# Patient Record
Sex: Female | Born: 1975 | Race: Black or African American | Hispanic: No | Marital: Married | Smoking: Never smoker
Health system: Southern US, Community
[De-identification: ages and names within clinical notes are randomized; demographics above are authoritative.]

## PROBLEM LIST (undated history)

## (undated) DIAGNOSIS — M6200 Separation of muscle (nontraumatic), unspecified site: Secondary | ICD-10-CM

## (undated) DIAGNOSIS — F419 Anxiety disorder, unspecified: Secondary | ICD-10-CM

## (undated) DIAGNOSIS — J3489 Other specified disorders of nose and nasal sinuses: Secondary | ICD-10-CM

## (undated) DIAGNOSIS — R05 Cough: Secondary | ICD-10-CM

## (undated) DIAGNOSIS — I10 Essential (primary) hypertension: Secondary | ICD-10-CM

## (undated) DIAGNOSIS — E049 Nontoxic goiter, unspecified: Secondary | ICD-10-CM

## (undated) HISTORY — PX: COLONOSCOPY: SHX174

## (undated) HISTORY — DX: Anxiety disorder, unspecified: F41.9

---

## 2000-05-11 ENCOUNTER — Other Ambulatory Visit: Admission: RE | Admit: 2000-05-11 | Discharge: 2000-05-11 | Payer: Self-pay | Admitting: Obstetrics & Gynecology

## 2002-04-14 ENCOUNTER — Other Ambulatory Visit: Admission: RE | Admit: 2002-04-14 | Discharge: 2002-04-14 | Payer: Self-pay | Admitting: Obstetrics & Gynecology

## 2002-08-29 ENCOUNTER — Other Ambulatory Visit: Admission: RE | Admit: 2002-08-29 | Discharge: 2002-08-29 | Payer: Self-pay | Admitting: Obstetrics & Gynecology

## 2003-11-05 ENCOUNTER — Other Ambulatory Visit: Admission: RE | Admit: 2003-11-05 | Discharge: 2003-11-05 | Payer: Self-pay | Admitting: Obstetrics & Gynecology

## 2004-08-31 ENCOUNTER — Emergency Department (HOSPITAL_COMMUNITY): Admission: EM | Admit: 2004-08-31 | Discharge: 2004-08-31 | Payer: Self-pay | Admitting: Emergency Medicine

## 2005-01-06 ENCOUNTER — Inpatient Hospital Stay (HOSPITAL_COMMUNITY): Admission: RE | Admit: 2005-01-06 | Discharge: 2005-01-09 | Payer: Self-pay | Admitting: Obstetrics and Gynecology

## 2005-01-06 ENCOUNTER — Encounter (INDEPENDENT_AMBULATORY_CARE_PROVIDER_SITE_OTHER): Payer: Self-pay | Admitting: *Deleted

## 2005-01-06 HISTORY — PX: RIGHT OOPHORECTOMY: SHX2359

## 2005-02-05 ENCOUNTER — Other Ambulatory Visit: Admission: RE | Admit: 2005-02-05 | Discharge: 2005-02-05 | Payer: Self-pay | Admitting: Obstetrics and Gynecology

## 2006-03-31 ENCOUNTER — Ambulatory Visit: Payer: Self-pay | Admitting: Gastroenterology

## 2006-05-05 ENCOUNTER — Ambulatory Visit: Payer: Self-pay | Admitting: Gastroenterology

## 2006-07-02 ENCOUNTER — Inpatient Hospital Stay (HOSPITAL_COMMUNITY): Admission: AD | Admit: 2006-07-02 | Discharge: 2006-07-06 | Payer: Self-pay | Admitting: Obstetrics & Gynecology

## 2006-07-02 ENCOUNTER — Encounter (INDEPENDENT_AMBULATORY_CARE_PROVIDER_SITE_OTHER): Payer: Self-pay | Admitting: Specialist

## 2006-07-02 HISTORY — PX: TUBAL LIGATION: SHX77

## 2007-07-03 ENCOUNTER — Emergency Department (HOSPITAL_COMMUNITY): Admission: EM | Admit: 2007-07-03 | Discharge: 2007-07-03 | Payer: Self-pay | Admitting: Emergency Medicine

## 2008-05-22 LAB — CONVERTED CEMR LAB: Pap Smear: NORMAL

## 2008-08-03 ENCOUNTER — Ambulatory Visit: Payer: Self-pay | Admitting: Internal Medicine

## 2008-08-03 DIAGNOSIS — I10 Essential (primary) hypertension: Secondary | ICD-10-CM

## 2008-08-03 DIAGNOSIS — D509 Iron deficiency anemia, unspecified: Secondary | ICD-10-CM

## 2008-08-03 LAB — CONVERTED CEMR LAB
ALT: 16 units/L (ref 0–35)
Albumin: 4.3 g/dL (ref 3.5–5.2)
BUN: 13 mg/dL (ref 6–23)
Basophils Relative: 0.8 % (ref 0.0–3.0)
Bilirubin Urine: NEGATIVE
CO2: 30 meq/L (ref 19–32)
Calcium: 9.6 mg/dL (ref 8.4–10.5)
Eosinophils Relative: 6.6 % — ABNORMAL HIGH (ref 0.0–5.0)
GFR calc Af Amer: 125 mL/min
Glucose, Bld: 105 mg/dL — ABNORMAL HIGH (ref 70–99)
HCT: 40.6 % (ref 36.0–46.0)
HDL: 62.7 mg/dL (ref >39.0)
Hemoglobin: 13.5 g/dL (ref 12.0–15.0)
Lymphocytes Relative: 38.8 % (ref 12.0–46.0)
Monocytes Absolute: 0.5 10*3/uL (ref 0.1–1.0)
Monocytes Relative: 9.4 % (ref 3.0–12.0)
Neutro Abs: 2.1 10*3/uL (ref 1.4–7.7)
Nitrite: NEGATIVE
RBC: 4.24 M/uL (ref 3.87–5.11)
Saturation Ratios: 25.4 % (ref 20.0–50.0)
Specific Gravity, Urine: 1.02 (ref 1.000–1.03)
Total Protein, Urine: NEGATIVE mg/dL
Total Protein: 8.7 g/dL — ABNORMAL HIGH (ref 6.0–8.3)
Transferrin: 368.6 mg/dL — ABNORMAL HIGH (ref >=212.0)
WBC: 4.8 10*3/uL (ref 4.5–10.5)
pH: 6 (ref 5.0–8.0)

## 2008-11-08 ENCOUNTER — Ambulatory Visit: Payer: Self-pay | Admitting: Internal Medicine

## 2008-11-08 DIAGNOSIS — M79609 Pain in unspecified limb: Secondary | ICD-10-CM | POA: Insufficient documentation

## 2008-11-09 ENCOUNTER — Telehealth: Payer: Self-pay | Admitting: Internal Medicine

## 2009-03-01 ENCOUNTER — Ambulatory Visit: Payer: Self-pay | Admitting: Internal Medicine

## 2009-03-01 DIAGNOSIS — R5381 Other malaise: Secondary | ICD-10-CM | POA: Insufficient documentation

## 2009-03-01 DIAGNOSIS — R5383 Other fatigue: Secondary | ICD-10-CM

## 2009-03-01 DIAGNOSIS — E049 Nontoxic goiter, unspecified: Secondary | ICD-10-CM | POA: Insufficient documentation

## 2009-03-01 DIAGNOSIS — N920 Excessive and frequent menstruation with regular cycle: Secondary | ICD-10-CM

## 2009-03-04 ENCOUNTER — Encounter: Payer: Self-pay | Admitting: Internal Medicine

## 2009-03-05 LAB — CONVERTED CEMR LAB
BUN: 10 mg/dL (ref 6–23)
Basophils Absolute: 0.1 10*3/uL (ref 0.0–0.1)
Bilirubin Urine: NEGATIVE
Bilirubin, Direct: 0.1 mg/dL (ref 0.0–0.3)
Chloride: 108 meq/L (ref 96–112)
Creatinine, Ser: 0.7 mg/dL (ref 0.4–1.2)
Eosinophils Absolute: 0.2 10*3/uL (ref 0.0–0.7)
Eosinophils Relative: 3.9 % (ref 0.0–5.0)
Folate: 6.9 ng/mL
GFR calc non Af Amer: 123.62 mL/min (ref >60)
Glucose, Bld: 97 mg/dL (ref 70–99)
HCT: 35.7 % — ABNORMAL LOW (ref 36.0–46.0)
Iron: 63 ug/dL (ref 42–145)
Ketones, ur: NEGATIVE mg/dL
Lymphs Abs: 2.1 10*3/uL (ref 0.7–4.0)
MCV: 99.2 fL (ref 78.0–100.0)
Monocytes Absolute: 0.5 10*3/uL (ref 0.1–1.0)
Neutrophils Relative %: 49.7 % (ref 43.0–77.0)
Platelets: 308 10*3/uL (ref 150.0–400.0)
Potassium: 4.4 meq/L (ref 3.5–5.1)
RDW: 13.3 % (ref 11.5–14.6)
Sed Rate: 34 mm/hr — ABNORMAL HIGH (ref 0–22)
TSH: 0.6 microintl units/mL (ref 0.35–5.50)
Total Bilirubin: 0.5 mg/dL (ref 0.3–1.2)
Total Protein, Urine: NEGATIVE mg/dL
Transferrin: 356.9 mg/dL (ref 212.0–360.0)
Urine Glucose: NEGATIVE mg/dL
Urobilinogen, UA: 0.2 (ref 0.0–1.0)
Vit D, 25-Hydroxy: 15 ng/mL — ABNORMAL LOW (ref 30–89)
Vitamin B-12: 368 pg/mL (ref 211–911)
WBC: 5.9 10*3/uL (ref 4.5–10.5)

## 2009-05-13 ENCOUNTER — Encounter: Admission: RE | Admit: 2009-05-13 | Discharge: 2009-05-13 | Payer: Self-pay | Admitting: Internal Medicine

## 2009-05-13 ENCOUNTER — Encounter: Payer: Self-pay | Admitting: Internal Medicine

## 2009-05-13 DIAGNOSIS — E042 Nontoxic multinodular goiter: Secondary | ICD-10-CM | POA: Insufficient documentation

## 2009-05-21 ENCOUNTER — Encounter: Admission: RE | Admit: 2009-05-21 | Discharge: 2009-05-21 | Payer: Self-pay | Admitting: Internal Medicine

## 2009-05-21 ENCOUNTER — Encounter: Payer: Self-pay | Admitting: Internal Medicine

## 2009-05-21 ENCOUNTER — Other Ambulatory Visit: Admission: RE | Admit: 2009-05-21 | Discharge: 2009-05-21 | Payer: Self-pay | Admitting: Diagnostic Radiology

## 2009-10-17 ENCOUNTER — Ambulatory Visit: Payer: Self-pay | Admitting: Internal Medicine

## 2009-10-21 ENCOUNTER — Telehealth: Payer: Self-pay | Admitting: Internal Medicine

## 2009-10-24 ENCOUNTER — Telehealth: Payer: Self-pay | Admitting: Internal Medicine

## 2009-10-24 ENCOUNTER — Encounter (INDEPENDENT_AMBULATORY_CARE_PROVIDER_SITE_OTHER): Payer: Self-pay | Admitting: *Deleted

## 2009-10-24 ENCOUNTER — Ambulatory Visit: Payer: Self-pay | Admitting: Internal Medicine

## 2009-10-28 ENCOUNTER — Telehealth: Payer: Self-pay | Admitting: Internal Medicine

## 2009-10-29 ENCOUNTER — Telehealth: Payer: Self-pay | Admitting: Internal Medicine

## 2009-10-29 ENCOUNTER — Ambulatory Visit: Payer: Self-pay | Admitting: Cardiovascular Disease

## 2009-10-29 DIAGNOSIS — J039 Acute tonsillitis, unspecified: Secondary | ICD-10-CM

## 2009-10-30 ENCOUNTER — Telehealth: Payer: Self-pay | Admitting: Internal Medicine

## 2009-10-30 ENCOUNTER — Ambulatory Visit: Payer: Self-pay | Admitting: Internal Medicine

## 2009-10-30 LAB — CONVERTED CEMR LAB
Eosinophils Absolute: 0.1 10*3/uL (ref 0.0–0.7)
Eosinophils Relative: 1.4 % (ref 0.0–5.0)
Lymphocytes Relative: 29.5 % (ref 12.0–46.0)
MCV: 94.7 fL (ref 78.0–100.0)
Mono Screen: POSITIVE
Monocytes Absolute: 0.6 10*3/uL (ref 0.1–1.0)
Neutrophils Relative %: 60.6 % (ref 43.0–77.0)
Platelets: 349 10*3/uL (ref 150.0–400.0)
Sed Rate: 42 mm/hr — ABNORMAL HIGH (ref 0–22)
WBC: 7.3 10*3/uL (ref 4.5–10.5)

## 2010-03-31 ENCOUNTER — Ambulatory Visit: Payer: Self-pay | Admitting: Internal Medicine

## 2010-03-31 DIAGNOSIS — K219 Gastro-esophageal reflux disease without esophagitis: Secondary | ICD-10-CM | POA: Insufficient documentation

## 2010-04-01 ENCOUNTER — Telehealth: Payer: Self-pay | Admitting: Internal Medicine

## 2010-05-01 ENCOUNTER — Telehealth: Payer: Self-pay | Admitting: Internal Medicine

## 2010-05-01 DIAGNOSIS — E041 Nontoxic single thyroid nodule: Secondary | ICD-10-CM

## 2010-05-12 ENCOUNTER — Encounter: Admission: RE | Admit: 2010-05-12 | Discharge: 2010-05-12 | Payer: Self-pay | Admitting: Internal Medicine

## 2010-07-07 ENCOUNTER — Telehealth: Payer: Self-pay | Admitting: Gastroenterology

## 2010-07-08 ENCOUNTER — Encounter: Payer: Self-pay | Admitting: Nurse Practitioner

## 2010-07-08 ENCOUNTER — Encounter: Payer: Self-pay | Admitting: Gastroenterology

## 2010-07-08 ENCOUNTER — Ambulatory Visit
Admission: RE | Admit: 2010-07-08 | Discharge: 2010-07-08 | Payer: Self-pay | Source: Home / Self Care | Attending: Internal Medicine | Admitting: Internal Medicine

## 2010-07-08 DIAGNOSIS — K5900 Constipation, unspecified: Secondary | ICD-10-CM | POA: Insufficient documentation

## 2010-07-08 DIAGNOSIS — K625 Hemorrhage of anus and rectum: Secondary | ICD-10-CM | POA: Insufficient documentation

## 2010-07-13 ENCOUNTER — Encounter: Payer: Self-pay | Admitting: Internal Medicine

## 2010-07-15 ENCOUNTER — Ambulatory Visit
Admission: RE | Admit: 2010-07-15 | Discharge: 2010-07-15 | Payer: Self-pay | Source: Home / Self Care | Attending: Internal Medicine | Admitting: Internal Medicine

## 2010-07-15 ENCOUNTER — Encounter: Payer: Self-pay | Admitting: Internal Medicine

## 2010-07-16 DIAGNOSIS — H6690 Otitis media, unspecified, unspecified ear: Secondary | ICD-10-CM | POA: Insufficient documentation

## 2010-07-18 ENCOUNTER — Other Ambulatory Visit: Payer: Self-pay | Admitting: Obstetrics & Gynecology

## 2010-07-24 NOTE — Progress Notes (Signed)
Summary: sore throat  Phone Note Call from Patient   Caller: Patient Summary of Call: Patient called c/o increased sore throat today, no fever. She states that she has four tabs of moxatag left and wanted to [facility] if something different is needed or what MD advise. Thanks Initial call taken by: Rock Nephew CMA,  Oct 21, 2009 11:31 AM  Follow-up for Phone Call        keep taking moxatag, f/up soon if she's having trouble swallowing Follow-up by: Etta Grandchild MD,  Oct 21, 2009 11:45 AM  Additional Follow-up for Phone Call Additional follow up Details #1::        informed pt Additional Follow-up by: Ami Bullins CMA,  Oct 21, 2009 2:58 PM

## 2010-07-24 NOTE — Progress Notes (Signed)
     Follow-up for Phone Call       Follow-up by: Etta Grandchild MD,  Oct 30, 2009 11:05 AM    Additional Follow-up for Phone Call Additional follow up Details #2::    she has mono, it will take time for her to feel better Follow-up by: Etta Grandchild MD,  Oct 30, 2009 11:05 AM  Additional Follow-up for Phone Call Additional follow up Details #3:: Details for Additional Follow-up Action Taken: Patient notified and request rx to help numb throat. [facility]acy is [facility].Marland KitchenMarland KitchenAlvy Beal Archie CMA  Oct 30, 2009 11:31 AM   Patient notified rx sent in.Marland KitchenMarland KitchenAlvy Beal Archie CMA  Oct 30, 2009 1:02 PM   New/Updated Medications: * PRELONE 15 MG/5ML SYRP (PREDNISOLONE)/VISCOUS LIDOCAINE swish and swallow 5 ml by mouth QID as needed for sore throat Prescriptions: PRELONE 15 MG/5ML SYRP (PREDNISOLONE)/VISCOUS LIDOCAINE swish and swallow 5 ml by mouth QID as needed for sore throat  #120 ml x 0   Entered and Authorized by:   Etta Grandchild MD   Signed by:   Etta Grandchild MD on 10/30/2009   Method used:   Printed then faxed [facility] ...       [facility]  [address] [phone][facility] ([facility]ail)       [address]       [place], [place]  [id number]       Ph: [phone] or [phone]       Fax: [phone]   RxID:   [id number]

## 2010-07-24 NOTE — Progress Notes (Signed)
Summary: Appt sooner tahn 1st avail  Phone Note Call from Patient Call back at [phone] in Lab   Call For: Dr Christella Hartigan Summary of Call: Having a problem with constipation. First avail appt 2-28 can she be seen sooner? Initial call taken by: Leanor Kail [facility],  July 07, 2010 12:48 PM  Follow-up for Phone Call        given appt. with N.P. for tomorrow. Follow-up by: Teryl Lucy RN,  July 07, 2010 3:22 PM

## 2010-07-24 NOTE — Letter (Signed)
Summary: Out of Work  [facility]  [address] [place], [place] [address]   Phone: [phone]  Fax: [phone]    October 17, 2009   Employee:  MARZETTA LANZA [employer]    To Whom It May Concern:   For Medical reasons, please excuse the above named employee from work for the following dates:  Start:   10/17/09  End:   10/21/09  If you need additional information, please feel free to contact our office.         Sincerely,    Etta Grandchild MD

## 2010-07-24 NOTE — Assessment & Plan Note (Signed)
Summary: acute/LMB   Vital Signs:  Patient profile:   35 year old female Height:      65.5 inches (166.37 cm) Weight:      153.0 pounds (69.55 kg) O2 Sat:      99 % on Room air Temp:     98.5 degrees F (36.94 degrees C) oral Pulse rate:   70 / minute BP sitting:   130 / 92  (left arm) Cuff size:   regular  Vitals Entered By: Orlan Leavens RMA (March 31, 2010 10:36 AM)  O2 Flow:  Room air CC:  Heartburn x's 1 week Is Patient Diabetic? No Pain Assessment Patient in pain? no      Comments Pt states ? anxiety, very anxious. Heart fluttering at times. Pt states her dog was killed week ago, and that when she started having sxs.    Primary Care Lusia Greis:  Corwin Levins MD  CC:   Heartburn x's 1 week.  History of Present Illness:  Heartburn      This is a 35 year old woman who presents with Heartburn.  The symptoms began 1 week ago.  The intensity is described as moderate.  precipitated by anxiety related to unexpected death of dog.  The patient reports acid reflux and sour taste in mouth, but denies epigastric pain, chest pain, trouble swallowing, weight loss, and weight gain.  The patient denies the following alarm features: melena, dysphagia, hematemesis, vomiting, involuntary weight loss >5%, and history of anemia.  Symptoms are worse with lying down.  Prior evaluation has included no diagnostic studies.  Treatment that was tried and either found to be ineffective or stopped due to problems include an OTC antacid and an OTC PPI.    Clinical Review Panels:  Immunizations   Last Tetanus Booster:  given (05/22/2008)   Last Flu Vaccine:  given (05/22/2008)   Last H1N1 Vaccine 1:  H1N1 vaccine G code (05/22/2008)  CBC   WBC:  7.3 (10/30/2009)   RBC:  3.60 (10/30/2009)   Hgb:  11.6 (10/30/2009)   Hct:  34.1 (10/30/2009)   Platelets:  349.0 (10/30/2009)   MCV  94.7 (10/30/2009)   MCHC  34.0 (10/30/2009)   RDW  13.7 (10/30/2009)   PMN:  60.6 (10/30/2009)   Lymphs:  29.5  (10/30/2009)   Monos:  7.9 (10/30/2009)   Eosinophils:  1.4 (10/30/2009)   Basophil:  0.6 (10/30/2009)  Complete Metabolic Panel   Glucose:  97 (03/01/2009)   Sodium:  141 (03/01/2009)   Potassium:  4.4 (03/01/2009)   Chloride:  108 (03/01/2009)   CO2:  28 (03/01/2009)   BUN:  10 (03/01/2009)   Creatinine:  0.7 (03/01/2009)   Albumin:  3.8 (03/01/2009)   Total Protein:  7.6 (03/01/2009)   Calcium:  9.0 (03/01/2009)   Total Bili:  0.5 (03/01/2009)   Alk Phos:  45 (03/01/2009)   SGPT (ALT):  15 (03/01/2009)   SGOT (AST):  20 (03/01/2009)   Current Medications (verified): 1)  Hydrochlorothiazide 25 Mg  Tabs (Hydrochlorothiazide) .... Take 1 Tab By Mouth Every Morning  Allergies (verified): No Known Drug Allergies  Past History:  Past Medical History: Hypertension  Review of Systems  The patient denies fever, dyspnea on exertion, peripheral edema, and abdominal pain.    Physical Exam  General:  alert, well-developed, well-nourished, and cooperative to examination.    Lungs:  normal respiratory effort, no intercostal retractions or use of accessory muscles; normal breath sounds bilaterally - no crackles and no wheezes.  Heart:  normal rate, regular rhythm, no murmur, and no rub. BLE without edema. Abdomen:  soft, non-tender, normal bowel sounds, no distention; no masses and no appreciable hepatomegaly or splenomegaly.   Psych:  Oriented X3, memory intact for recent and remote, normally interactive, good eye contact, not anxious appearing, mildly depressed appearing and tired but not agitated.      Impression & Recommendations:  Problem # 1:  GERD (ICD-530.81)  mild symptoms, exam benign worse with anxiety and stress over past 1 week ineffective OTC tx with pepcid otc and mylanta - samples and rx for dexilant today - erx done to f/u if symptoms unimproved Her updated medication list for this problem includes:    Dexilant 60 Mg Cpdr (Dexlansoprazole) .Marland Kitchen... 1 by  mouth once daily  Labs Reviewed: Hgb: 11.6 (10/30/2009)   Hct: 34.1 (10/30/2009)  Orders: Prescription Created Electronically [phone])  Problem # 2:  MOURNING (ICD-309.0) precipitated by unexpected loss of pet dog - generic ambein given to use as needed for short term reliefof assoc insomnia -   Complete Medication List: 1)  Hydrochlorothiazide 25 Mg Tabs (Hydrochlorothiazide) .... Take 1 tab by mouth every morning 2)  Zolpidem Tartrate 5 Mg Tabs (Zolpidem tartrate) .Marland Kitchen.. 1 by mouth at bedtime as needed for sleep 3)  Dexilant 60 Mg Cpdr (Dexlansoprazole) .Marland Kitchen.. 1 by mouth once daily  Patient Instructions: 1)  it was good to see you today. 2)  use zolipedem for sleep as needed and dexilant once daily for heartburn/reflux symptoms - your prescriptions have been faxed and electronically submitted to your pharmacy. Please take as directed. Contact our office if you believe you're having problems with the medication(s).  3)  Please schedule a follow-up appointment as needed. Prescriptions: DEXILANT 60 MG CPDR (DEXLANSOPRAZOLE) 1 by mouth once daily  #30 x 0   Entered and Authorized by:   Newt Lukes MD   Signed by:   Newt Lukes MD on 03/31/2010   Method used:   [facility]        [facility]  [address] [phone][facility] (retail)       [address]       [place], [place]  [address]       Ph: [phone] or [phone]       Fax: [phone]   RxID:   [id number] ZOLPIDEM TARTRATE 5 MG TABS (ZOLPIDEM TARTRATE) 1 by mouth at bedtime as needed for sleep  #20 x 0   Entered and Authorized by:   Newt Lukes MD   Signed by:   Newt Lukes MD on 03/31/2010   Method used:   Printed then faxed to ...       [facility]  [address] [phone]* (retail)       [address]       [place], [place]  [address]       Ph: [phone] or [phone]       Fax: [phone]   RxID:   [id number]

## 2010-07-24 NOTE — Letter (Signed)
Summary: [facility] Instructions  [facility]  [address] [place], [place] [address]   Phone: [phone]  Fax: [phone]       Vickie Galvan    [birth year]    MRN: [id number]        Procedure Day /Date:08-05-2010     Arrival Time:1:30 PM      Procedure Time :2:30 PM     Location of Procedure:                    X     [facility] (4th Floor)  PREPARATION FOR COLONOSCOPY WITH MOVIPREP   Starting 5 days prior to your procedure 07-31-2010  do not eat nuts, seeds, popcorn, corn, beans, peas,  salads, or any raw vegetables.  Do not take any fiber supplements (e.g. Metamucil, Citrucel, and Benefiber).  THE DAY BEFORE YOUR PROCEDURE         DATE: 08-04-2010  DAY: Monday  1.  Drink clear liquids the entire day-NO SOLID FOOD  2.  Do not drink anything colored red or purple.  Avoid juices with pulp.  No orange juice.  3.  Drink at least 64 oz. (8 glasses) of fluid/clear liquids during the day to prevent dehydration and help the prep work efficiently.  CLEAR LIQUIDS INCLUDE: Water Jello Ice Popsicles Tea (sugar ok, no milk/cream) Powdered fruit flavored drinks Coffee (sugar ok, no milk/cream) Gatorade Juice: apple, white grape, white cranberry  Lemonade Clear bullion, consomm, broth Carbonated beverages (any kind) Strained chicken noodle soup Hard Candy                             4.  In the morning, mix first dose of MoviPrep solution:    Empty 1 Pouch A and 1 Pouch B into the disposable container    Add lukewarm drinking water to the top line of the container. Mix to dissolve    Refrigerate (mixed solution should be used within 24 hrs)  5.  Begin drinking the prep at 5:00 p.m. The MoviPrep container is divided by 4 marks.   Every 15 minutes drink the solution down to the next mark (approximately 8 oz) until the full liter is complete.   6.  Follow completed prep with 16 oz of clear liquid of your choice (Nothing red or purple).  Continue to  drink clear liquids until bedtime.  7.  Before going to bed, mix second dose of MoviPrep solution:    Empty 1 Pouch A and 1 Pouch B into the disposable container    Add lukewarm drinking water to the top line of the container. Mix to dissolve    Refrigerate  THE DAY OF YOUR PROCEDURE      DATE:08-04-2010 DAY: Monday  Beginning at 9:30 AM  (5 hours before procedure):         1. Every 15 minutes, drink the solution down to the next mark (approx 8 oz) until the full liter is complete.  2. Follow completed prep with 16 oz. of clear liquid of your choice.    3. You may drink clear liquids until 12: 30 PM  (2 HOURS BEFORE PROCEDURE).   MEDICATION INSTRUCTIONS  Unless otherwise instructed, you should take regular prescription medications with a small sip of water   as early as possible the morning of your procedure.       OTHER INSTRUCTIONS  You will need a responsible adult at least  35 years of age to accompany you and drive you home.   This person must remain in the waiting room during your procedure.  Wear loose fitting clothing that is easily removed.  Leave jewelry and other valuables at home.  However, you may wish to bring a book to read or  an iPod/MP3 player to listen to music as you wait for your procedure to start.  Remove all body piercing jewelry and leave at home.  Total time from sign-in until discharge is approximately 2-3 hours.  You should go home directly after your procedure and rest.  You can resume normal activities the  day after your procedure.  The day of your procedure you should not:   Drive   Make legal decisions   Operate machinery   Drink alcohol   Return to work  You will receive specific instructions about eating, activities and medications before you leave.    The above instructions have been reviewed and explained to me by   _______________________    I fully understand and can verbalize these instructions  _____________________________ Date _________

## 2010-07-24 NOTE — Assessment & Plan Note (Addendum)
Summary: constipation   History of Present Illness Visit Type: Follow-up Visit Primary GI MD: Rob Bunting MD Primary Provider: Bayard Males Requesting Provider: n/a Chief Complaint: constipation, with the feeling of having to push while having a bowel movement. Pt tried Mag Citrate last weekend but does not think it cleaned her out History of Present Illness:   Patient was seen by Dr. Christella Hartigan in 2007 for rectal bleeding. She was pregnant at the time and plan wasfor her to return after delivery for a flexible sigmoidoscopy. She ddin't return because rectal bleeding resolved.   Patient presents today with a change in bowel habits. Normally has 2-3 solid BMs a day. In the last two weeks she has needed to strain. Complains of gassiness / bloating post intercourse. This am she had a small amount of rectal bleeding (tissue). No rectal pain. No abdominal pain. No nausea or vomiting.  Weight stable. No fevers.    GI Review of Systems      Denies abdominal pain, acid reflux, belching, bloating, chest pain, dysphagia with liquids, dysphagia with solids, heartburn, loss of appetite, nausea, vomiting, vomiting blood, weight loss, and  weight gain.      Reports constipation and  rectal bleeding.     Denies anal fissure, black tarry stools, change in bowel habit, diarrhea, diverticulosis, fecal incontinence, heme positive stool, hemorrhoids, irritable bowel syndrome, jaundice, light color stool, liver problems, and  rectal pain.    Current Medications (verified): 1)  Hydrochlorothiazide 25 Mg  Tabs (Hydrochlorothiazide) .... 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 Never Smoked Alcohol use-yes - occasional  Review of Systems  The patient denies allergy/sinus, anemia, anxiety-new, arthritis/joint pain, back pain, blood in urine, breast changes/lumps, change in vision, confusion, cough, coughing up blood, depression-new, fainting, fatigue, fever, headaches-new, hearing problems, heart murmur, heart rhythm changes, itching, menstrual pain, muscle pains/cramps, night sweats, nosebleeds, pregnancy symptoms, shortness of breath, skin rash, sleeping problems, sore throat, swelling of feet/legs, swollen lymph glands, thirst - excessive , urination - excessive , urination changes/pain, urine leakage, vision changes, and voice change.    Vital Signs:  Patient profile:   35 year old female Height:      65.5 inches Weight:      153 pounds BMI:     25.16 BSA:     1.78 Pulse rate:   88 / minute Pulse rhythm:   regular BP sitting:   130 / 82  (left arm)  Vitals Entered By: Merri Ray CMA Duncan Dull) (July 08, 2010 2:22 PM)  Physical Exam  General:  Well developed, well nourished, no acute distress. Eyes:  Conjunctiva pink, no icterus.  Neck:  no obvious masses  Lungs:  Clear throughout to auscultation. Heart:  Regular rate and rhythm; no murmurs, rubs,  or bruits. Abdomen:  Soft, nontender and nondistended. No masses, hepatosplenomegaly or hernias noted. Normal bowel sounds. Rectal:  No external lesions. Stool light brown, heme negative. Msk:  Symmetrical with no gross deformities. Normal posture. Extremities:  No clubbing, cyanosis, edema or deformities noted. Neurologic:  Alert and  oriented x4;  grossly normal neurologically. Skin:  Intact without significant lesions or rashes. Cervical Nodes:  No significant cervical adenopathy. Psych:  Alert and cooperative. Normal mood and affect.   Impression & Recommendations:  Problem # 1:  RECTAL  BLEEDING (ICD-569.3) Assessment Deteriorated Bleeding during pregnancy in 2007. Now with episode of bleeding this am. Her bleeding is likely perianal and related to constipation but underlying neoplasm should be excluded. The patient will be scheduled for a colonoscopy with biopsies/polypectomy (if indicated).  The risks and benefits of the procedure, as well as alternatives were discussed with the patient and she agrees to proceed.  Orders: Colonoscopy (Colon)  Patient Instructions: 1)  We schedueld the Colonoscopy with Dr. Rob Bunting on 08-05-2010.  2)  [facility] Patient Information Guide given to patient. 3)  Directions and brochure provided. 4)  Copy sent to : Dr. Oliver Barre 5)  The medication list was reviewed and reconciled.  All changed / newly prescribed medications were explained.  A complete medication list was provided to the patient / caregiver. Prescriptions: MOVIPREP 100 GM  SOLR (PEG-KCL-NACL-NASULF-NA ASC-C) As per prep instructions.  #1 x 0   Entered by:   Lowry Ram NCMA   Authorized by:   Willette Cluster NP   Signed by:   Lowry Ram NCMA on 07/08/2010   Method used:   [facility]  [address] [phone][facility] (retail)       [address]       [place], [place]  [id number]       Ph: [phone] or [phone]       Fax: [phone]   RxID:   [id number]

## 2010-07-24 NOTE — Progress Notes (Signed)
Summary: RESULTS  Phone Note Call from Patient Call back at Work Phone [phone]   Summary of Call: Patient is requesting results of CT scan. Continues to have trouble swallowing.  Initial call taken by: Lamar Sprinkles, CMA,  Oct 29, 2009 5:15 PM  Follow-up for Phone Call        the scan looks normal for the tonsils, she has a few benign thyroid nodules, if she still has pain I think she might need some blood work Follow-up by: Etta Grandchild MD,  Oct 30, 2009 7:33 AM  Additional Follow-up for Phone Call Additional follow up Details #1::        Pt notified per MD and would like to proceed with lab order. Please advise on labs. Thanks.Marland KitchenMarland KitchenAlvy Beal Archie CMA  Oct 30, 2009 8:53 AM    patient notified and order added.Marland KitchenMarland KitchenAlvy Beal Archie CMA  Oct 30, 2009 9:51 AM  Additional Follow-up by: Etta Grandchild MD,  Oct 30, 2009 8:54 AM  New Problems: TONSILLITIS (ICD-463)   Additional Follow-up for Phone Call Additional follow up Details #2::    done Follow-up by: Etta Grandchild MD,  Oct 30, 2009 8:55 AM  New Problems: TONSILLITIS [phone])

## 2010-07-24 NOTE — Letter (Signed)
Summary: Out of Work  [facility]  [address] [place], [place] [address]   Phone: [phone]  Fax: [phone]    July 08, 2010   Employee:  Vickie Galvan [facility]    To Whom It May Concern:   For Medical reasons, please excuse the above named employee from work for the following dates:  Start:   07-08-2010  End:   07-09-2010  If you need additional information, please feel free to contact our office.         Sincerely,     Willette Cluster ACNP

## 2010-07-24 NOTE — Assessment & Plan Note (Signed)
Summary: strep throat   Vital Signs:  Patient profile:   35 year old female Height:      65.5 inches Weight:      145.50 pounds BMI:     23.93 O2 Sat:      99 % on Room air Temp:     99.3 degrees F oral Pulse rate:   104 / minute Pulse rhythm:   regular Resp:     16 per minute BP sitting:   104 / 76  (left arm) Cuff size:   large  Vitals Entered By: Rock Nephew CMA (October 17, 2009 10:21 AM)  O2 Flow:  Room air CC: strep throat x 2days, URI symptoms Pain Assessment Patient in pain? yes     Location: throat   Primary Care Provider:  Corwin Levins MD  CC:  strep throat x 2days and URI symptoms.  History of Present Illness:  URI Symptoms      This is a 35 year old woman who presents with URI symptoms.  The symptoms began 2 days ago.  The severity is described as moderate.  The patient reports sore throat and earache, but denies nasal congestion, clear nasal discharge, purulent nasal discharge, dry cough, productive cough, and sick contacts.  Associated symptoms include low-grade fever (<100.5 degrees).  The patient denies stiff neck, dyspnea, wheezing, rash, vomiting, diarrhea, use of an antipyretic, and response to antipyretic.  The patient also reports headache.  The patient denies seasonal symptoms, muscle aches, and severe fatigue.  Risk factors for Strep sinusitis include Strep exposure, tender adenopathy, and absence of cough.  The patient denies the following risk factors for Strep sinusitis: unilateral facial pain, unilateral nasal discharge, and poor response to decongestant.    Preventive Screening-Counseling & Management  Alcohol-Tobacco     Alcohol drinks/day: 0     Smoking Status: never     Passive Smoke Exposure: no  Hep-HIV-STD-Contraception     Hepatitis Risk: no risk noted     HIV Risk: no     STD Risk: no risk noted     Sun Exposure-Excessive: occasionally      Sexual History:  currently monogamous.        Drug Use:  no.        Blood Transfusions:   no.    Medications Prior to Update: 1)  Hydrochlorothiazide 25 Mg  Tabs (Hydrochlorothiazide) .... Take 1 Tab By Mouth Every Morning  Current Medications (verified): 1)  Hydrochlorothiazide 25 Mg  Tabs (Hydrochlorothiazide) .... Take 1 Tab By Mouth Every Morning 2)  Moxatag 775 Mg Xr24h-Tab (Amoxicillin) .... One By Mouth Once Daily For Strep Throat  Allergies (verified): No Known Drug Allergies  Past History:  Past Medical History: Reviewed history from 08/03/2008 and no changes required. Hypertension  Past Surgical History: Reviewed history from 08/03/2008 and no changes required. Denies surgical history  Family History: Reviewed history from 08/03/2008 and no changes required. HTN-maternal grandparents DM- maternal grandfather Stroke-maternal grandfather  Social History: Reviewed history from 08/03/2008 and no changes required. 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  The patient denies anorexia, weight loss, decreased hearing, hoarseness, chest pain, prolonged cough, abdominal pain, hematuria, suspicious skin lesions, and angioedema.  Physical Exam  General:  alert, well-developed, well-nourished, well-hydrated, appropriate dress, normal appearance, healthy-appearing, cooperative to examination, and good hygiene.   Head:  normocephalic, atraumatic, no abnormalities observed, and no abnormalities palpated.   Ears:  R ear normal and L ear normal.   Nose:  External nasal examination shows no deformity or inflammation. Nasal mucosa are pink and moist without lesions or exudates. Mouth:  no exudates, no posterior lymphoid hypertrophy, no postnasal drip, no pharyngeal crowing, no lesions, no aphthous ulcers,  no erosions, no tongue abnormalities, no leukoplakia, no petechiae, and pharyngeal erythema.   Neck:  supple, full ROM, and no masses.   Lungs:  Normal respiratory effort, chest expands symmetrically. Lungs are clear to auscultation, no crackles or wheezes. Heart:  Normal rate and regular rhythm. S1 and S2 normal without gallop, murmur, click, rub or other extra sounds. Abdomen:  Bowel sounds positive,abdomen soft and non-tender without masses, organomegaly or hernias noted. Msk:  No deformity or scoliosis noted of thoracic or lumbar spine.   Pulses:  R and L carotid,radial,femoral,dorsalis pedis and posterior tibial pulses are full and equal bilaterally Extremities:  No clubbing, cyanosis, edema, or deformity noted with normal full range of motion of all joints.   Neurologic:  No cranial nerve deficits noted. Station and gait are normal. Plantar reflexes are down-going bilaterally. DTRs are symmetrical throughout. Sensory, motor and coordinative functions appear intact. Skin:  turgor normal, color normal, no rashes, no suspicious lesions, no ecchymoses, no petechiae, no purpura, no ulcerations, and no edema.   Cervical Nodes:  R anterior LN tender and L anterior LN tender.   Axillary Nodes:  no R axillary adenopathy and no L axillary adenopathy.   Inguinal Nodes:  no R inguinal adenopathy and no L inguinal adenopathy.   Psych:  Cognition and judgment appear intact. Alert and cooperative with normal attention span and concentration. No apparent delusions, illusions, hallucinations   Impression & Recommendations:  Problem # 1:  STREP THROAT (ICD-034.0) Assessment New  Her updated medication list for this problem includes:    Moxatag 775 Mg Xr24h-tab (Amoxicillin) ..... One by mouth once daily for strep throat  Instructed to complete antibiotics and call if not improved in 48 hours.   Complete Medication List: 1)  Hydrochlorothiazide 25 Mg Tabs (Hydrochlorothiazide) .... Take 1 tab by mouth  every morning 2)  Moxatag 775 Mg Xr24h-tab (Amoxicillin) .... One by mouth once daily for strep throat  Patient Instructions: 1)  Please schedule a follow-up appointment as needed. 2)  Get plenty of rest, drink lots of clear liquids, and use Tylenol or Ibuprofen for fever and comfort. Return in 7-10 days if you're not better:sooner if you're feeling worse. 3)  Take your antibiotic as prescribed until ALL of it is gone, but stop if you develop a rash or swelling and contact our office as soon as possible. Prescriptions: MOXATAG 775 MG XR24H-TAB (AMOXICILLIN) One by mouth once daily for strep throat  #10 x 0   Entered and Authorized by:   Etta Grandchild MD   Signed by:   Etta Grandchild MD on 10/17/2009   Method used:   Samples Given   RxID:   [id number]   Laboratory Results    Other Tests  Rapid Strep: positive

## 2010-07-24 NOTE — Assessment & Plan Note (Signed)
Summary: follow up sore throat, still no better   Vital Signs:  Patient profile:   35 year old female Height:      65.5 inches Weight:      145.50 pounds O2 Sat:      99 % on Room air Temp:     98.7 degrees F oral Pulse rate:   92 / minute Pulse rhythm:   regular Resp:     16 per minute BP sitting:   106 / 72  (left arm) Cuff size:   large  Vitals Entered By: Rock Nephew CMA (Oct 24, 2009 1:04 PM)  O2 Flow:  Room air  Primary Care Provider:  Corwin Levins MD   History of Present Illness: She complains of persistent sore throat.  Preventive Screening-Counseling & Management  Alcohol-Tobacco     Alcohol drinks/day: 0     Smoking Status: never     Passive Smoke Exposure: no  Medications Prior to Update: 1)  Hydrochlorothiazide 25 Mg  Tabs (Hydrochlorothiazide) .... Take 1 Tab By Mouth Every Morning 2)  Moxatag 775 Mg Xr24h-Tab (Amoxicillin) .... One By Mouth Once Daily For Strep Throat  Current Medications (verified): 1)  Hydrochlorothiazide 25 Mg  Tabs (Hydrochlorothiazide) .... Take 1 Tab By Mouth Every Morning 2)  Moxatag 775 Mg Xr24h-Tab (Amoxicillin) .... One By Mouth Once Daily For Strep Throat  Allergies (verified): No Known Drug Allergies  Past History:  Past Medical History: Reviewed history from 08/03/2008 and no changes required. Hypertension  Past Surgical History: Reviewed history from 08/03/2008 and no changes required. Denies surgical history  Family History: Reviewed history from 08/03/2008 and no changes required. HTN-maternal grandparents DM- maternal grandfather Stroke-maternal grandfather  Social History: Reviewed history from 08/03/2008 and no changes required. 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   ENT:   Complains of sore throat; denies decreased hearing, difficulty swallowing, ear discharge, earache, hoarseness, nasal congestion, nosebleeds, ringing in ears, and sinus pressure.  Physical Exam  General:  alert, well-developed, well-nourished, well-hydrated, appropriate dress, normal appearance, healthy-appearing, cooperative to examination, and good hygiene.   Head:  normocephalic, atraumatic, no abnormalities observed, and no abnormalities palpated.   Ears:  R ear normal and L ear normal.   Mouth:  good dentition, pharynx pink and moist, no erythema, no exudates, no posterior lymphoid hypertrophy, no postnasal drip, no pharyngeal crowing, no lesions, no aphthous ulcers, no erosions, no tongue abnormalities, no leukoplakia, and no petechiae.   Neck:  supple, full ROM, no masses, no thyromegaly, no JVD, normal carotid upstroke, no carotid bruits, no cervical lymphadenopathy, and no neck tenderness.   Lungs:  normal respiratory effort, no intercostal retractions, no accessory muscle use, normal breath sounds, no dullness, no fremitus, no crackles, and no wheezes.   Heart:  normal rate, regular rhythm, no murmur, no gallop, no rub, and no JVD.   Abdomen:  soft, non-tender, normal bowel sounds, no distention, no masses, no guarding, no rigidity, no hepatomegaly, and no splenomegaly.   Msk:  No deformity or scoliosis noted of thoracic or lumbar spine.   Pulses:  R and L carotid,radial,femoral,dorsalis pedis and posterior tibial pulses are full and equal bilaterally Extremities:  No clubbing, cyanosis, edema, or deformity noted with normal full range of motion of all joints.   Neurologic:  No cranial  nerve deficits noted. Station and gait are normal. Plantar reflexes are down-going bilaterally. DTRs are symmetrical throughout. Sensory, motor and coordinative functions appear intact. Skin:  turgor normal, color normal, no rashes, no suspicious lesions, no ecchymoses, and no petechiae.   Cervical Nodes:  no  anterior cervical adenopathy and no posterior cervical adenopathy.   Axillary Nodes:  no R axillary adenopathy and no L axillary adenopathy.   Psych:  Cognition and judgment appear intact. Alert and cooperative with normal attention span and concentration. No apparent delusions, illusions, hallucinations   Impression & Recommendations:  Problem # 1:  STREP THROAT (ICD-034.0) Assessment Unchanged  Her updated medication list for this problem includes:    Moxatag 775 Mg Xr24h-tab (Amoxicillin) ..... One by mouth once daily for strep throat  Orders: Depo- Medrol 40mg  (J1030) Depo- Medrol 80mg  (J1040) Admin of Therapeutic Inj  intramuscular or subcutaneous (16109)  Instructed to complete antibiotics and call if not improved in 48 hours.   Complete Medication List: 1)  Hydrochlorothiazide 25 Mg Tabs (Hydrochlorothiazide) .... Take 1 tab by mouth every morning 2)  Moxatag 775 Mg Xr24h-tab (Amoxicillin) .... One by mouth once daily for strep throat  Patient Instructions: 1)  Please schedule a follow-up appointment in 2 weeks. 2)  Get plenty of rest, drink lots of clear liquids, and use Tylenol or Ibuprofen for fever and comfort. Return in 7-10 days if you're not better:sooner if you're feeling worse. 3)  Take 650-1000mg  of Tylenol every 4-6 hours as needed for relief of pain or comfort of fever AVOID taking more than 4000mg   in a 24 hour period (can cause liver damage in higher doses). 4)  Take 400-600mg  of Ibuprofen (Advil, Motrin) with food every 4-6 hours as needed for relief of pain or comfort of fever.   Medication Administration  Injection # 1:    Medication: Depo- Medrol 80mg     Diagnosis: STREP THROAT (ICD-034.0)    Route: IM    Site: R deltoid    Exp Date: 04/2012    Lot #: [id number]    Mfr: pfizer    Patient tolerated injection without complications    Given by: Rock Nephew CMA (Oct 24, 2009 1:22 PM)  Injection # 2:    Medication: Depo- Medrol 40mg     Diagnosis: STREP  THROAT (ICD-034.0)    Route: IM    Site: L deltoid    Exp Date: 04/2012    Mfr: pfizer    Patient tolerated injection without complications    Given by: Rock Nephew CMA (Oct 24, 2009 1:22 PM)  Orders Added: 1)  Depo- Medrol 40mg  [J1030] 2)  Depo- Medrol 80mg  [J1040] 3)  Admin of Therapeutic Inj  intramuscular or subcutaneous [96372] 4)  Est. Patient Level III [60454]

## 2010-07-24 NOTE — Assessment & Plan Note (Signed)
Summary: Sinus   Vital Signs:  Patient profile:   35 year old female Height:      65.5 inches (166.37 cm) O2 Sat:      97 % on Room air Temp:     98.8 degrees F (37.11 degrees C) oral Pulse rate:   118 / minute BP sitting:   112 / 78  (left arm) Cuff size:   regular  Vitals Entered By: Orlan Leavens RMA (July 15, 2010 3:45 PM)  O2 Flow:  Room air CC: Sinus drainage & (R) ear hurts, Ear pain Is Patient Diabetic? No Pain Assessment Patient in pain? yes     Location: (R) ear Type: aching   Primary Care Provider:  Bayard Males  CC:  Sinus drainage & (R) ear hurts and Ear pain.  History of Present Illness:  Ear Pain      This is a 35 year old woman who presents with Ear pain.  The symptoms began 6 days ago.  The severity is described as moderate.  The patient also reports sensation of fullness, tinnitus, sinus pain, and nasal discharge, but denies ear discharge, hearing loss, fever, and jaw click.  The pain is located in the right ear.  The pain is described as constant and worse in morning.  Associated symptoms include headache, popping or crackling sounds, and pressure.  The patient denies toothache, dizziness, and vertigo.  Patient has no history of acid reflux disease, hay fever/allergies, and impacted cerumen.  Prior treatment has included decongestants.    Current Medications (verified): 1)  Hydrochlorothiazide 25 Mg  Tabs (Hydrochlorothiazide) .... Take 1 Tab By Mouth Every Morning 2)  Moviprep 100 Gm  Solr (Peg-Kcl-Nacl-Nasulf-Na Asc-C) .... As Per Prep Instructions.  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    Physical Exam  General:  alert, well-developed, well-nourished, and  cooperative to examination.    Head:  Normocephalic and atraumatic without obvious abnormalities. No apparent alopecia or balding. Eyes:  vision grossly intact; pupils equal, round and reactive to light.  conjunctiva and lids normal.    Ears:  normal pinnae bilaterally, without erythema, swelling, or tenderness to palpation. L TM clear and R TM mild erythema; no effusion or cerumen impaction. Hearing grossly normal bilaterally  Nose:  tenderness to palp over maxillary region Mouth:  teeth and gums in good repair; mucous membranes moist, without lesions or ulcers. oropharynx clear without exudate, mild erythema.  Lungs:  normal respiratory effort, no intercostal retractions or use of accessory muscles; normal breath sounds bilaterally - no crackles and no wheezes.    Heart:  normal rate, regular rhythm, no murmur, and no rub. BLE without edema.   Impression & Recommendations:  Problem # 1:  OTITIS MEDIA, RIGHT (ICD-382.9)  Her updated medication list for this problem includes:    Augmentin 875-125 Mg Tabs (Amoxicillin-pot clavulanate) .Marland Kitchen... 1 by mouth two times a day x 7 days    Naprosyn 500 Mg Tabs (Naproxen) .Marland Kitchen... 1 by mouth two times a day x 5 days, then as needed for pain  Instructed on prevention and treatment. Call if no improvement in 48-72 hours or sooner if worsening symptoms.   Orders: Prescription Created Electronically [phone])  Complete Medication List: 1)  Hydrochlorothiazide 25  Mg Tabs (Hydrochlorothiazide) .... Take 1 tab by mouth every morning 2)  Moviprep 100 Gm Solr (Peg-kcl-nacl-nasulf-na asc-c) .... As per prep instructions. 3)  Augmentin 875-125 Mg Tabs (Amoxicillin-pot clavulanate) .Marland Kitchen.. 1 by mouth two times a day x 7 days 4)  Naprosyn 500 Mg Tabs (Naproxen) .Marland Kitchen.. 1 by mouth two times a day x 5 days, then as needed for pain 5)  Sudafed 30 Mg Tabs (Pseudoephedrine hcl) .Marland Kitchen.. 1-2 by mouth every 4 hours as needed for congestion symptoms  Patient Instructions: 1)  it was  good to see you today. 2)  augmentin antibiotics, naprosyn for pain and inflammation and sudafed for decongestants - your prescriptions have been electronically submitted to your pharmacy. Please take as directed. Contact our office if you believe you're having problems with the medication(s).  3)  Get plenty of rest, drink lots of clear liquids - Return in 7-10 days if you're not better:sooner if you're feeling worse. Prescriptions: NAPROSYN 500 MG TABS (NAPROXEN) 1 by mouth two times a day x 5 days, then as needed for pain  #20 x 0   Entered and Authorized by:   Newt Lukes MD   Signed by:   Newt Lukes MD on 07/15/2010   Method used:   [facility]        [facility]  [address] [phone]* (retail)       [address]       [place], [place]  [address]       Ph: [phone] or [phone]       Fax: [phone]   RxID:   [id number] AUGMENTIN 875-125 MG TABS (AMOXICILLIN-POT CLAVULANATE) 1 by mouth two times a day x 7 days  #14 x 0   Entered and Authorized by:   Newt Lukes MD   Signed by:   Newt Lukes MD on 07/15/2010   Method used:   [facility]        [facility]  [address] [phone]* (retail)       [address]       [place], [place]  [address]       Ph: [phone] or [phone]       Fax: [phone]   RxID:   [id number]    Orders Added: 1)  Est. Patient Level IV [62831] 2)  Prescription Created Electronically [phone]

## 2010-07-24 NOTE — Progress Notes (Signed)
Summary: throat  Phone Note Call from Patient   Caller: Patient Summary of Call: Patient called stating that after injection last week her throat started feeling better. She not c/o pain on the left side of throat only. Patinet is requesting MD advise and want to know if magis mouth wash will work. Please advise, [facility] rx [facility] [address] Initial call taken by: Rock Nephew CMA,  Oct 28, 2009 9:26 AM  Follow-up for Phone Call        no. i dont' think mmw will help. if she still has throat pain then will need to do A ct scan of the throat to look for abscess. Follow-up by: Etta Grandchild MD,  Oct 28, 2009 9:32 AM  Additional Follow-up for Phone Call Additional follow up Details #1::        Patient gave verbal understanding and would like to have scan ordered.Marland KitchenMarland KitchenMarland KitchenAlvy Beal Archie CMA  Oct 28, 2009 10:13 AM  Additional Follow-up by: Etta Grandchild MD,  Oct 28, 2009 10:35 AM  New Problems: ABSCESS, PERITONSILLAR, LEFT (ICD-475)   New Problems: ABSCESS, PERITONSILLAR, LEFT (ICD-475)

## 2010-07-24 NOTE — Letter (Signed)
Summary: Handout Printed  Printed Handout:  - *[facility] Patient Instructions 

## 2010-07-24 NOTE — Progress Notes (Signed)
Summary: med change  Phone Note Call from Patient Call back at ext [phone]   Caller: Patient Reason for Call: Talk to Doctor, Insurance Question Summary of Call: Insurance will not cover dexilant. Pt is requesting something else for heartburn. Pls advise? Initial call taken by: Orlan Leavens RMA,  April 01, 2010 8:54 AM  Follow-up for Phone Call        pt was given coupon card in the sample to help with cost - if still unable to afford, change dexilant to generic protonix (see emr - i listed this historically - may send erx as needed) thanks Follow-up by: Newt Lukes MD,  April 01, 2010 9:04 AM  Additional Follow-up for Phone Call Additional follow up Details #1::        [facility] Additional Follow-up by: Orlan Leavens RMA,  April 01, 2010 9:10 AM    New/Updated Medications: PANTOPRAZOLE SODIUM 40 MG TBEC (PANTOPRAZOLE SODIUM) 1 by mouth once daily Prescriptions: PANTOPRAZOLE SODIUM 40 MG TBEC (PANTOPRAZOLE SODIUM) 1 by mouth once daily  #30 x 0   Entered by:   Orlan Leavens RMA   Authorized by:   Newt Lukes MD   Signed by:   Orlan Leavens RMA on 04/01/2010   Method used:   [facility]        [facility]  [address] [phone]* (retail)       [address]       [place], [place]  [address]       Ph: [phone] or [phone]       Fax: [phone]   RxID:   [id number] PANTOPRAZOLE SODIUM 40 MG TBEC (PANTOPRAZOLE SODIUM) 1 by mouth once daily  #30 x 0   Entered and Authorized by:   Newt Lukes MD   Signed by:   Newt Lukes MD on 04/01/2010   Method used:   Historical   RxID:   [id number]

## 2010-07-24 NOTE — Progress Notes (Signed)
Summary: SORE THROAT  Phone Note Call from Patient   Summary of Call: Pt completed antibiotic and c/o only very small relief in throat pain. NO fever or any new symptoms.  Initial call taken by: Lamar Sprinkles, CMA,  Oct 24, 2009 10:00 AM  Follow-up for Phone Call        she has an appt today Follow-up by: Etta Grandchild MD,  Oct 24, 2009 10:05 AM  Additional Follow-up for Phone Call Additional follow up Details #1::        Pt is unable to get flexability from manager for time off. Any suggestions? Additional Follow-up by: Lamar Sprinkles, CMA,  Oct 24, 2009 10:24 AM    Additional Follow-up for Phone Call Additional follow up Details #2::    no Follow-up by: Etta Grandchild MD,  Oct 24, 2009 10:25 AM  Additional Follow-up for Phone Call Additional follow up Details #3:: Details for Additional Follow-up Action Taken: Pt will keep apt.  Additional Follow-up by: Lamar Sprinkles, CMA,  Oct 24, 2009 11:29 AM

## 2010-07-24 NOTE — Progress Notes (Signed)
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

## 2010-08-05 ENCOUNTER — Other Ambulatory Visit: Payer: Self-pay | Admitting: Gastroenterology

## 2010-08-05 ENCOUNTER — Other Ambulatory Visit (AMBULATORY_SURGERY_CENTER): Payer: BC Managed Care – PPO | Admitting: Gastroenterology

## 2010-08-05 DIAGNOSIS — R933 Abnormal findings on diagnostic imaging of other parts of digestive tract: Secondary | ICD-10-CM

## 2010-08-05 DIAGNOSIS — K5730 Diverticulosis of large intestine without perforation or abscess without bleeding: Secondary | ICD-10-CM

## 2010-08-05 DIAGNOSIS — K625 Hemorrhage of anus and rectum: Secondary | ICD-10-CM

## 2010-08-05 DIAGNOSIS — D129 Benign neoplasm of anus and anal canal: Secondary | ICD-10-CM

## 2010-08-05 DIAGNOSIS — D128 Benign neoplasm of rectum: Secondary | ICD-10-CM

## 2010-08-05 DIAGNOSIS — K648 Other hemorrhoids: Secondary | ICD-10-CM

## 2010-08-13 NOTE — Procedures (Addendum)
Summary: Colonoscopy  Patient: Vickie Galvan Note: All result statuses are Final unless otherwise noted.  Tests: (1) Colonoscopy (COL)   COL Colonoscopy           DONE     [facility]     [address]     [place], [place]  [address]           COLONOSCOPY PROCEDURE REPORT           PATIENT:  Vickie Galvan, Vickie Galvan  MR#:  [id number]     BIRTHDATE:  [birth year], 34 yrs. old  GENDER:  female     ENDOSCOPIST:  Rachael Fee, MD     REF. BY:  Oliver Barre, M.D.     PROCEDURE DATE:  08/05/2010     PROCEDURE:  Colonoscopy with biopsy     ASA CLASS:  Class II     INDICATIONS:  minor rectal bleeding, constipation (resolved)     MEDICATIONS:   Fentanyl 100 mcg IV, Versed 10 mg IV           DESCRIPTION OF PROCEDURE:   After the risks benefits and     alternatives of the procedure were thoroughly explained, informed     consent was obtained.  Digital rectal exam was performed and     revealed no rectal masses.   The LB PCF-[id number] endoscope     was introduced through the anus and advanced to the cecum, which     was identified by both the appendix and ileocecal valve, without     limitations.  The quality of the prep was good, using MoviPrep.     The instrument was then slowly withdrawn as the colon was fully     examined.     <<PROCEDUREIMAGES>>     FINDINGS:  Internal Hemorrhoids were found. These were small.  The     distal rectum was slightly erythematous, biosies taken and sent to     pathology (jar 1) (see image1).  Mild diverticulosis was found in     the sigmoid to descending colon segments (see image2).  This was     otherwise a normal examination of the colon (see image3, image4,     and image5).   Retroflexed views in the rectum revealed no     abnormalities.    The scope was then withdrawn from the patient     and the procedure completed.     COMPLICATIONS:  None           ENDOSCOPIC IMPRESSION:     1) Small internal hemorrhoids     2) Distal rectum slightly  erythematous, biopsies taken to check     for chronic inflammation     3) Mild diverticulosis in the sigmoid to descending colon     segments     4) Otherwise normal examination           RECOMMENDATIONS:     Await biopsy results.           ______________________________     Rachael Fee, MD           n.     eSIGNED:   Rachael Fee at 08/05/2010 02:36 PM           Dalbert Batman, [id number]  Note: An exclamation mark (!) indicates a result that was not dispersed into the flowsheet. Document Creation Date: 08/05/2010 2:36 PM _______________________________________________________________________  (1) Order result status: Final Collection or observation date-time: 08/05/2010  14:31 Requested date-time:  Receipt date-time:  Reported date-time:  Referring Physician:   Ordering Physician: Rob Bunting [phone]) Specimen Source:  Source: Launa Grill Order Number: [id number] Lab site:

## 2010-08-15 ENCOUNTER — Encounter: Payer: Self-pay | Admitting: Gastroenterology

## 2010-08-19 NOTE — Letter (Signed)
Summary: Results Letter  [facility]  [address] [place], [place] [address]   Phone: [phone]  Fax: [phone]        August 15, 2010 MRN: [id number]    [facility] [address] [place], [place]  [address]    Dear Ms. Gockley,   The biopsies taken during your recent colonoscopy were all essentially normal.  You should continue to follow the recommendations that we discussed at the time of your procedure.  Please feel free to call if you have any further questions or concerns.        Sincerely,  Rachael Fee MD  This letter has been electronically signed by your physician.  Appended Document: Results Letter letter mailed

## 2010-08-20 ENCOUNTER — Ambulatory Visit (INDEPENDENT_AMBULATORY_CARE_PROVIDER_SITE_OTHER): Payer: BC Managed Care – PPO | Admitting: Internal Medicine

## 2010-08-20 ENCOUNTER — Encounter: Payer: Self-pay | Admitting: Internal Medicine

## 2010-08-20 DIAGNOSIS — H6690 Otitis media, unspecified, unspecified ear: Secondary | ICD-10-CM

## 2010-08-20 DIAGNOSIS — M542 Cervicalgia: Secondary | ICD-10-CM

## 2010-08-20 DIAGNOSIS — I10 Essential (primary) hypertension: Secondary | ICD-10-CM

## 2010-08-28 NOTE — Assessment & Plan Note (Addendum)
Summary: ?ear infection/sinus issues/lb   Vital Signs:  Patient profile:   35 year old female Height:      65.5 inches Weight:      152 pounds BMI:     25.00 O2 Sat:      99 % on Room air Temp:     99.2 degrees F oral Pulse rate:   90 / minute BP sitting:   124 / 82  (left arm) Cuff size:   regular  Vitals Entered By: Zella Ball Ewing CMA (AAMA) (August 20, 2010 1:26 PM)  O2 Flow:  Room air CC: Ear pain, fever, headache and sinus pressure/RE   Primary Care Provider:  Bayard Males  CC:  Ear pain, fever, and headache and sinus pressure/RE.  History of Present Illness: here for acute visit - was much improved from tx last visit, but now with 2-3 days symptoms seems to be recurring, with fever, right earache, HA, general weakness and maliase, and also pain to the area of the head behind the right ear, and sorness of the right paracervical neck area, without overt swelling or mass. Has some sinus congestion, but the right ear is the worstl.    Pt denies CP, worsening sob, doe, wheezing, orthopnea, pnd, worsening LE edema, palps, dizziness or syncope  Pt denies new neuro symptoms such as headache, facial or extremity weakness  Pt denies polydipsia, polyuria.  neck supple, no bowel or bladder change, gait change or fall.    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 Take 1 Tab By Mouth Every Morning 2)  Naprosyn 500 Mg  Tabs (Naproxen) .Marland Kitchen.. 1 By Mouth Two Times A Day X 5 Days, Then As Needed For Pain  Current Medications (verified): 1)  Hydrochlorothiazide 25 Mg  Tabs (Hydrochlorothiazide) .... Take 1 Tab By Mouth Every Morning 2)  Naprosyn 500 Mg Tabs (Naproxen) .Marland Kitchen.. 1 By Mouth Two Times A Day X 5 Days, Then As Needed For Pain 3)  Levofloxacin 500 Mg Tabs (Levofloxacin) .Marland Kitchen.. 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   Eyes:  vision grossly intact, pupils equal, and pupils round.   Ears:  right tm mod erythema without bulging or loss of landmarks, left TM ok,  canals clear, sinus nontender  but does have tender to right mastoid area as well as less tender to right paracervical area without LA , mass or other sweling Nose:  nasal dischargemucosal pallor and mucosal edema.   Mouth:  pharyngeal erythema and fair dentition.   Neck:  supple and cervical lymphadenopathy.   Lungs:  normal respiratory effort and normal breath sounds.   Heart:  normal rate and regular rhythm.   Msk:  spine nontender Extremities:  no edema, no erythema    Impression & Recommendations:  Problem # 1:  OTITIS MEDIA, RIGHT (ICD-382.9)  Her updated medication list for this problem includes:    Naprosyn 500 Mg  Tabs (Naproxen) .Marland Kitchen... 1 by mouth two times a day x 5 days, then as needed for pain    Levofloxacin 500 Mg Tabs (Levofloxacin) .Marland Kitchen... 1 by mouth once daily with some ? of right mastoiditis as well - for levofloxocin 500 once daily course;  consider ENT eval, f/u any worsening symtpoms  Instructed on prevention and treatment. Call if no improvement in 48-72 hours or sooner if worsening symptoms.   Problem # 2:  HYPERTENSION (ICD-401.9)  Her updated medication list for this problem includes:    Hydrochlorothiazide 25 Mg Tabs (Hydrochlorothiazide) .Marland Kitchen... Take 1 tab by mouth every morning  BP today: 124/82 Prior BP: 112/78 (07/15/2010)  Labs Reviewed: K+: 4.4 (03/01/2009) Creat: : 0.7 (03/01/2009)   Chol: 161 (08/03/2008)   HDL: 62.7 (08/03/2008)   LDL: 90 (08/03/2008)   TG: 44 (08/03/2008) stable overall by hx and exam, ok to continue meds/tx as is   Problem # 3:  CERVICALGIA (ICD-723.1)  Her updated medication list for this problem includes:    Naprosyn 500 Mg Tabs (Naproxen) .Marland Kitchen... 1 by mouth two times a day x 5 days, then as needed for pain has tender paracervical area but no LA palpable, I suspect related to the right ear and ? mastoiditis;  ok to cont as is and f/u any worsening symtpoms  Complete Medication List: 1)  Hydrochlorothiazide 25 Mg Tabs (Hydrochlorothiazide) .... Take 1 tab by mouth every morning 2)  Naprosyn 500 Mg Tabs (Naproxen) .Marland Kitchen.. 1 by mouth two times a day x 5 days, then as needed for pain 3)  Levofloxacin 500 Mg Tabs (Levofloxacin) .Marland Kitchen.. 1 by mouth once daily  Patient Instructions: 1)  Please take all new medications as prescribed 2)  Continue all previous medications as before this visit  3)  You can also use Mucinex OTC or it's generic for congestion  4)  Please schedule a follow-up appointment in 6 mo for CPX with labs Prescriptions: LEVOFLOXACIN 500 MG TABS (LEVOFLOXACIN) 1 by mouth once daily  #10 x 0   Entered and Authorized by:   Corwin Levins MD   Signed  by:   Corwin Levins MD on 08/20/2010   Method used:   Electronically to        [facility]  [address] [phone]* (retail)       [address]       [place], [place]  [id number]       Ph: [phone] or [phone]       Fax: [phone]   RxID:   [id number]    Orders Added: 1)  Est. Patient Level IV [41660]  Appended Document: ?ear infection/sinus issues/lb pt called  - still with pain - ok referral to ENT per pt request  robin - to call pt to inform  Appended Document: ?ear infection/sinus issues/lb patient informed.

## 2010-09-01 ENCOUNTER — Encounter: Payer: Self-pay | Admitting: Internal Medicine

## 2010-09-04 ENCOUNTER — Encounter: Payer: Self-pay | Admitting: Internal Medicine

## 2010-09-09 NOTE — Miscellaneous (Signed)
Summary: Orders Update   Clinical Lists Changes  Orders: Added new Referral order of ENT Referral (ENT) - Signed 

## 2010-09-18 NOTE — Letter (Signed)
Summary: [facility]  [facility]   Imported By: Sherian Rein 09/08/2010 12:05:54  _____________________________________________________________________  External Attachment:    Type:   Image     Comment:   External Document

## 2010-10-27 ENCOUNTER — Ambulatory Visit (INDEPENDENT_AMBULATORY_CARE_PROVIDER_SITE_OTHER): Payer: BC Managed Care – PPO | Admitting: Internal Medicine

## 2010-10-27 ENCOUNTER — Encounter: Payer: Self-pay | Admitting: Internal Medicine

## 2010-10-27 VITALS — BP 144/90 | HR 83 | Temp 97.8°F | Ht 65.0 in | Wt 152.1 lb

## 2010-10-27 DIAGNOSIS — I10 Essential (primary) hypertension: Secondary | ICD-10-CM

## 2010-10-27 DIAGNOSIS — F419 Anxiety disorder, unspecified: Secondary | ICD-10-CM

## 2010-10-27 DIAGNOSIS — Z0000 Encounter for general adult medical examination without abnormal findings: Secondary | ICD-10-CM

## 2010-10-27 DIAGNOSIS — Z9109 Other allergy status, other than to drugs and biological substances: Secondary | ICD-10-CM | POA: Insufficient documentation

## 2010-10-27 DIAGNOSIS — J309 Allergic rhinitis, unspecified: Secondary | ICD-10-CM

## 2010-10-27 DIAGNOSIS — F411 Generalized anxiety disorder: Secondary | ICD-10-CM

## 2010-10-27 MED ORDER — FLUTICASONE PROPIONATE 50 MCG/ACT NA SUSP
2.0000 | Freq: Every day | NASAL | Status: DC
Start: 2010-10-27 — End: 2011-10-27

## 2010-10-27 MED ORDER — METHYLPREDNISOLONE ACETATE 80 MG/ML IJ SUSP
120.0000 mg | Freq: Once | INTRAMUSCULAR | Status: AC
  Administered 2010-10-27: 120 mg via INTRAMUSCULAR

## 2010-10-27 MED ORDER — ALPRAZOLAM 0.25 MG PO TABS: 0.2500 mg | ORAL_TABLET | Freq: Two times a day (BID) | ORAL | Status: DC | PRN

## 2010-10-27 MED ORDER — ESCITALOPRAM OXALATE 10 MG PO TABS: 10.0000 mg | ORAL_TABLET | Freq: Every day | ORAL | Status: DC

## 2010-10-27 MED ORDER — FEXOFENADINE HCL 180 MG PO TABS: 180.0000 mg | ORAL_TABLET | Freq: Every day | ORAL | Status: DC

## 2010-10-27 NOTE — Patient Instructions (Signed)
You had the steroid shot today Take all new medications as prescribed Continue all other medications as before You can also take Delsym OTC for cough, and/or Mucinex (or it's generic off brand) for congestion Please return in 6 mo with Lab testing done 3-5 days before

## 2010-10-29 ENCOUNTER — Telehealth: Payer: Self-pay | Admitting: Internal Medicine

## 2010-10-29 ENCOUNTER — Other Ambulatory Visit: Payer: BC Managed Care – PPO

## 2010-10-29 ENCOUNTER — Telehealth: Payer: Self-pay

## 2010-10-29 ENCOUNTER — Other Ambulatory Visit (INDEPENDENT_AMBULATORY_CARE_PROVIDER_SITE_OTHER): Payer: BC Managed Care – PPO

## 2010-10-29 DIAGNOSIS — Z0000 Encounter for general adult medical examination without abnormal findings: Secondary | ICD-10-CM

## 2010-10-29 DIAGNOSIS — N390 Urinary tract infection, site not specified: Secondary | ICD-10-CM

## 2010-10-29 LAB — URINALYSIS, ROUTINE W REFLEX MICROSCOPIC
Bilirubin Urine: NEGATIVE
Ketones, ur: NEGATIVE
Nitrite: POSITIVE
Specific Gravity, Urine: >1.03 (ref 1.000–1.030)
Urobilinogen, UA: 0.2 (ref 0.0–1.0)

## 2010-10-29 MED ORDER — CEPHALEXIN 500 MG PO CAPS: 500.0000 mg | ORAL_CAPSULE | Freq: Four times a day (QID) | ORAL | Status: AC

## 2010-10-29 NOTE — Telephone Encounter (Signed)
Called patient informed of prescription sent to pharmacy.

## 2010-10-29 NOTE — Telephone Encounter (Signed)
Patient had urine checked in the lab, had large blood and moderate leukocytes. The patient is requesting a ua order put in as she will need an antiobitic.

## 2010-10-29 NOTE — Telephone Encounter (Signed)
Put order into the lab

## 2010-10-29 NOTE — Telephone Encounter (Signed)
Pt with UTI by UA today  treatment with antibx course as above  Robin to notify pt

## 2010-10-29 NOTE — Telephone Encounter (Signed)
Ok - 599.0

## 2010-11-02 ENCOUNTER — Encounter: Payer: Self-pay | Admitting: Internal Medicine

## 2010-11-02 NOTE — Assessment & Plan Note (Signed)
stable overall by hx and exam, most recent lab reviewed with pt, and pt to continue medical treatment as before  BP Readings from Last 3 Encounters:  10/27/10 144/90  08/20/10 124/82  07/15/10 112/78   Lab Results  Component Value Date   WBC 7.3 10/30/2009   HGB 11.6* 10/30/2009   HCT 34.1* 10/30/2009   PLT 349.0 10/30/2009   CHOL 161 08/03/2008   TRIG 44 08/03/2008   HDL 62.7 08/03/2008   ALT 15 03/01/2009   AST 20 03/01/2009   NA 141 03/01/2009   K 4.4 03/01/2009   CL 108 03/01/2009   CREATININE 0.7 03/01/2009   BUN 10 03/01/2009   CO2 28 03/01/2009   TSH 0.60 03/01/2009

## 2010-11-02 NOTE — Assessment & Plan Note (Signed)
Mild to mod, tx with antihist and flonase to start,  to f/u any worsening symptoms or concerns, consider allergy referral

## 2010-11-02 NOTE — Progress Notes (Signed)
  Subjective:    Patient ID: Vickie Galvan, female    DOB: [birth year], 35 y.o.   MRN: [id number]  HPI Here to f/u; overall doing ok,  Pt denies chest pain, increased sob or doe, wheezing, orthopnea, PND, increased LE swelling, palpitations, dizziness or syncope.  Pt denies new neurological symptoms such as new headache, or facial or extremity weakness or numbness   Pt denies polydipsia, polyuria, or low sugar symptoms such as weakness or confusion improved with po intake.  Pt states overall good compliance with meds, trying to follow lower cholesterol diet, wt overall stable but little exercise however. Denies worsening depressive symptoms, suicidal ideation, or panic, though has ongoing anxiety, not increased recently. Does have several wks ongoing nasal allergy symptoms with clear congestion, itch and sneeze, without fever, pain, ST, cough or wheezing. Past Medical History  Diagnosis Date  . Anxiety 10/27/2010  . Environmental allergies 10/27/2010  . ANEMIA, IRON DEFICIENCY 08/03/2008  . GERD 03/31/2010  . GOITER, MULTINODULAR 05/13/2009  . HYPERTENSION 08/03/2008   No past surgical history on file.  reports that she has never smoked. She does not have any smokeless tobacco history on file. She reports that she drinks alcohol. Her drug history not on file. family history is not on file. No Known Allergies No current outpatient prescriptions on file prior to visit.   Review of Systems All otherwise neg per pt     Objective:   Physical Exam BP 144/90  Pulse 83  Temp(Src) 97.8 F (36.6 C) (Oral)  Ht 5\' 5"  (1.651 m)  Wt 152 lb 2 oz (69.003 kg)  BMI 25.31 kg/m2  SpO2 99%  LMP 10/21/2010 Physical Exam  VS noted Constitutional: Pt appears well-developed and well-nourished.  HENT: Head: Normocephalic.  Right Ear: External ear normal.  Left Ear: External ear normal.  Bilat tm's mild erythema.  Sinus nontender.  Pharynx mild erythema Eyes: Conjunctivae and EOM are normal. Pupils are equal,  round, and reactive to light.  Neck: Normal range of motion. Neck supple.  Cardiovascular: Normal rate and regular rhythm.   Pulmonary/Chest: Effort normal and breath sounds normal.  Abd:  Soft, NT, non-distended, + BS Neurological: Pt is alert. No cranial nerve deficit.  Skin: Skin is warm. No erythema.  Psychiatric: Pt behavior is normal. Thought content normal. 1+ nervous        Assessment & Plan:

## 2010-11-02 NOTE — Assessment & Plan Note (Addendum)
stable overall by hx and exam, most recent lab reviewed with pt, and pt to continue medical treatment as before, with benzo and lexapro, declines counseling

## 2010-11-07 ENCOUNTER — Encounter: Payer: Self-pay | Admitting: Internal Medicine

## 2010-11-07 NOTE — Discharge Summary (Signed)
Vickie Galvan, Vickie Galvan              ACCOUNT NO.:  [id number]   MEDICAL RECORD NO.:  [id number]          PATIENT TYPE:  INP   LOCATION:  9124                          FACILITY:  [facility]   PHYSICIAN:  Carrington Clamp, M.D. DATE OF BIRTH:  05/26/1976   DATE OF ADMISSION:  01/06/2005  DATE OF DISCHARGE:  01/09/2005                                 DISCHARGE SUMMARY   FINAL DIAGNOSES:  1.  Intrauterine pregnancy at 37-and-a-half weeks gestation.  2.  A 13 cm complex right adnexal mass obstructing the pelvic outlet.   PROCEDURES:  Primary low flap transverse cesarean section and right  oophorectomy. Surgeon:  Dr. Miguel Aschoff. Assistant:  Dr. Lodema Hong.  Complications:  None.   This 35 year old G4 P1-0-2-1 presents at 37-and-a-half weeks gestation for a  cesarean section and removal of right ovarian mass obstructing the pelvic  outlet. The patient's antepartum course up to this point had been just  complicated by this ovarian mass. She did have a negative group B strep  culture obtained in the office at 35 weeks. She was taken to the operating  room on January 06, 2005 by Dr. Miguel Aschoff where a primary low transverse  cesarean section with the delivery of a 5-pound 13-ounce female infant with  Apgars of 8 and 9. At this point the right ovarian mass was performed.  Delivery went without complications. There was no evidence of malignancy  identified in this right ovarian mass.  The patient's postoperative course  was benign without significant fevers. She was felt ready for discharge on  postoperative day #3. She was sent home on a regular diet, told to decrease  activities, told to continue her prenatal vitamins, was given Percocet one  to two every 4 hours as needed for pain, told she could use over-the-counter  ibuprofen up to 600 mg every 6 hours as needed for pain, was to follow up in  the office in 4-6 weeks.   LABORATORY ON DISCHARGE:  The patient had a hemoglobin of 10.5, white blood  cell count of 9.1.      Leilani Able, P.A.-C.      Carrington Clamp, M.D.  Electronically Signed    MB/MEDQ  D:  02/05/2005  T:  02/05/2005  Job:  [id number]

## 2010-11-07 NOTE — Op Note (Signed)
Vickie Galvan, Vickie Galvan              ACCOUNT NO.:  [id number]   MEDICAL RECORD NO.:  [id number]          PATIENT TYPE:  INP   LOCATION:  9124                          FACILITY:  [facility]   PHYSICIAN:  Miguel Aschoff, M.D.       DATE OF BIRTH:  [birth year]   DATE OF PROCEDURE:  01/06/2005  DATE OF DISCHARGE:                                 OPERATIVE REPORT   PREOPERATIVE DIAGNOSES:  1.  Intrauterine pregnancy at 37-1/2 weeks.  2.  A 13 cm complex right adnexal mass obstructing pelvic outlet.   POSTOPERATIVE DIAGNOSES:  1.  Intrauterine pregnancy at 37-1/2 weeks.  2.  A 13 cm complex right adnexal mass obstructing pelvic outlet.   PROCEDURES:  1.  Primary low flap transverse cesarean section.  2.  Right oophorectomy.   SURGEON:  Miguel Aschoff, M.D.   ASSISTANT:  Luvenia Redden, M.D.   ANESTHESIA:  Spinal.   COMPLICATIONS:  None.   JUSTIFICATION:  The patient a 35 year old black female, gravida 4, para 1-0-  2-1, with an estimated date of confinement of January 24, 2005.  The patient  was noted to have a 13.3 x 10 x 10 cm complex mass filling the cul-de-sac,  completely keeping the presenting part from descending into the pelvis.  The  patient has been having exquisite pain secondary to this mass.  Because of  these findings, she is being taken to the operating room at this point to  undergo primary cesarean section and then have this mass dealt with  surgically.  The risks, benefits of the procedure were discussed with the  patient and informed consent has been obtained.   PROCEDURE:  The patient was taken to the operating, placed in sitting  position, and spinal anesthesia was administered without difficulty.  She  was then placed in a supine position deviated to the left and prepped and  draped in the usual sterile fashion.  A Foley catheter was inserted.  At  this point a Pfannenstiel incision was made, extended down through the  subcutaneous tissue with bleeding points being clamped  and coagulated as  they were encountered.  The fascia was then identified and incised  transversely.  The fascia was then separated from the underlying rectus  muscles.  The rectus muscles were divided in the midline.  The peritoneum  was then found and entered, carefully avoiding underlying structures.  The  peritoneal incision was then extended under direct visualization.  At this  point a bladder flap was created, protected with the bladder blade.  An  elliptical transverse incision was made into the lower uterine segment.  The  amniotic cavity was then entered and clear fluid was obtained and at this  point, the patient was delivered of a viable female infant, Apgar eight at  one minute and 9 at five  minutes from a vertex ROA position with the  assistance of a vacuum extractor.  The baby was handed to the pediatric team  in attendance.  The baby weighed 5 pounds 13 ounces.  Cord bloods were  obtained for appropriate studies.  The placenta  was then delivered.  The  uterus was evacuated of any remaining products of conception and the angles  of the uterine incision were ligated using figure-of-eight sutures of #1  Vicryl.  Then the uterus was closed in layers, the first layer was a running  interlocking suture of #1 Vicryl, followed by an imbricating suture of #1  Vicryl.  The bladder flap was reapproximated using running continuous 2-0  Vicryl suture.  At this point the large mass was elevated and removed from  the cul-de-sac, again it was approximately 13 x 10 x 10 cm.  It was a  multicystic mass with multiple loculations.  The mass was elevated and  removed and without rupturing the mass.  It was not possible to identify any  normal ovary within the mass.  There were no external excrescences.  There  was no abnormal fluid noted in the cul-de-sac.  There was no evidence of any  kind of peritoneal seeding.  Due to the total involvement of the ovary, it  was elected to proceed with  oophorectomy.  Kelly clamps were placed across  the mesovarian ligament and at this point the mass was excised and pedicles  were ligated using suture ligatures of 0 Vicryl and free ties of 0 Vicryl.  At this point the abdomen was irrigated with warm saline.  Hemostasis  appeared to be excellent.  The left tube and ovary were inspected and noted  be within normal limits.  The right tube was conserved.  At this point the  abdomen is closed.  The parietal peritoneum was closed using running  continuous 0 Vicryl suture.  The rectus muscles were reapproximated using  running continuous 0 Vicryl suture.  The fascia was closed using two sutures  of 0 Vicryl, each starting at the lateral fascial angles and meeting in the  midline.  Subcutaneous tissue and skin were closed using staples.  A  pressure dressing was applied.  The estimated blood loss from the procedure was approximately 600 mL.  The  patient tolerated the procedure well and went to the recovery room in  satisfactory condition.       AR/MEDQ  D:  01/06/2005  T:  01/06/2005  Job:  [id number]

## 2010-11-07 NOTE — Discharge Summary (Signed)
NAMECHERLYN, Vickie Galvan              ACCOUNT NO.:  [id number]   MEDICAL RECORD NO.:  [id number]          PATIENT TYPE:  INP   LOCATION:  9125                          FACILITY:  [facility]   PHYSICIAN:  Malva Limes, M.D.    DATE OF BIRTH:  1976-06-10   DATE OF ADMISSION:  07/02/2006  DATE OF DISCHARGE:  07/06/2006                               DISCHARGE SUMMARY   FINAL DIAGNOSES:  1. Intrauterine pregnancy at this [redacted] weeks gestation.  2. History of prior cesarean section.  3. The patient desires repeat cesarean section.  4. The patient desires permanent sterilization.  5. Postoperatively, the patient developed postpartum hypertension.   SURGEON:  Was Freddrick March. Tenny Craw, MD.   ASSISTANT:  I do not see the assistant the operative report.   PROCEDURE:  1. Repeat low transverse cesarean section.  2. Tubal ligation.   COMPLICATIONS:  None.   This 35 year old G5, P2-0-2-2 presents at [redacted] weeks gestation for repeat  cesarean section.  The patient had a prior cesarean section with her  last pregnancy secondary to a right ovarian mass which needed  oophorectomy at the time, and then she desired a repeat with this  pregnancy.  She also desired permanent sterilization be performed after  the delivery.  Her antepartum course up to this point had been  uncomplicated.  She is admitted at this time. She was taken to the  operating room on July 02, 2006, by Dr. Waynard Reeds where a repeat  low transverse cesarean section was performed with the delivery of a 7  pound 14 ounce female infant with Apgars of 9 and 9.  Delivery went  without complications.   Postoperatively, the patient's blood pressure started to increase. She  was started on hydrochlorothiazide. PIH labs were obtained. PIH labs  were within normal limits.  The patient was continued to be monitored.  She was on hydrochlorothiazide, and on postoperative day #3, Procardia  30 mg was added. She was felt ready for discharge on postoperative  day  #4. She was told to stop hydrochlorothiazide. She was told to continue  Procardia 30 mg XL b.i.d.  She was given Percocet one to two every 4-6  hours as needed for her pain actually [facility].  She was to follow up in  our office on Thursday for blood pressure check.   LABORATORY DATA ON DISCHARGE:  The patient had a hemoglobin of 9.8,  white blood cell count of 10.7, platelets of 355,000.  Normal liver  functions, normal LDH, and a normal uric acid.      Leilani Able, P.A.-C.    ______________________________  Malva Limes, M.D.    MB/MEDQ  D:  07/27/2006  T:  07/27/2006  Job:  [id number]

## 2010-11-07 NOTE — Assessment & Plan Note (Signed)
[facility]                           GASTROENTEROLOGY OFFICE NOTE   NAME:Vickie Galvan                       MRN:          [id number]  DATE:05/05/2006                            DOB:          11/06/1975    PRIMARY PHYSICIAN:  Freddrick March. Tenny Craw, M.D.   GASTROINTESTINAL PROBLEM LIST:  1. Minor limited rectal bleeding while pregnant.  Hemoglobin 10 (fairly      normal for a pregnant woman).   I last saw Vickie Galvan one month ago.  Since then the bleeding has definitely  improved.  She sees a little bit of pink with bowel movements, none in  between.  Her bowel habits are otherwise completely normal.  No diarrhea or  dramatic constipation.   CURRENT MEDICATIONS:  Prenatal vitamins.   PHYSICAL EXAMINATION:  VITAL SIGNS:  Weight 154, blood pressure 136/70,  pulse 90.  GENERAL:  Well-appearing.  ABDOMEN:  Soft.  Gravid.  Nontender.   ASSESSMENT/PLAN:  A 35 year old woman with likely minor hemorrhoidal  bleeding.   Vickie Galvan has certainly not noticed any increase in the bleeding and it seems  to be tapering off.  She has only a little bit of pink with an occasional  bowel movement.  My guess is this is indeed just minor internal hemorrhoids  that I felt on examination last month.  However, I do think that she should  return for probably sigmoidoscopy following her delivery.  She will  therefore come back to see me mid March which is approximately 2 months  after she is due to deliver.  At that point we will see how she is doing and  likely arrange for her to have a sigmoidoscopic exam just to confirm no  other pathology in her distal colon.     Rachael Fee, MD  Electronically Signed    DPJ/MedQ  DD: 05/05/2006  DT: 05/05/2006  Job #: [id number]   cc:   Freddrick March. Tenny Craw, MD

## 2010-11-07 NOTE — Op Note (Signed)
[name], Galvan              ACCOUNT NO.:  [id number]   MEDICAL RECORD NO.:  [id number]          PATIENT TYPE:  INP   LOCATION:  9125                          FACILITY:  [facility]   PHYSICIAN:  Kendra H. Tenny Craw, MD     DATE OF BIRTH:  16-Feb-1976   DATE OF PROCEDURE:  07/02/2006  DATE OF DISCHARGE:                               OPERATIVE REPORT   PREOPERATIVE DIAGNOSES:  1. Thirty-nine and 0 week intrauterine pregnancy.  2. Desired permanent sterilization.  3. History of prior cesarean section, declines trial of labor, desires      repeat.   POSTOPERATIVE DIAGNOSES:  1. Thirty-nine and 0 week intrauterine pregnancy.  2. Desired permanent sterilization.  3. History of prior cesarean section, declines trial of labor, desires      repeat.   </   INDICATION:  [name] is a gravida 5, para 2-0-0-2, at 39 weeks 0  days, who presents to [facility] for a scheduled repeat cesarean  section.  She has had an uncomplicated pregnancy to this point.  Her  prior cesarean section was done because she had a right ovarian mass  during her last pregnancy and at the time of C-section underwent a right  oophorectomy.   PROCEDURE:  Following the appropriate informed consent, [name] was  brought to the operating room, where she had spinal anesthesia  administered.  This was found to be adequate.  She was placed in the  dorsal supine position with a leftward tilt, prepped and draped in the  normal sterile fashion.  A scalpel was used to make a Pfannenstiel skin  incision, which was carried down through the underlying layers of soft  tissue to the fascia.  The fascia was incised in the midline and the  fascial incision extended laterally with Mayo scissors.  The superior  aspect of the fascial incision was grasped with Kocher clamps x2, tented  up, underlying rectus muscles were dissected off sharply with the  electrocautery unit.  The same procedure was repeated on the inferior  aspect of  the fascial incision.  The rectus muscles were separated in  the midline.  The abdominal peritoneum was identified and entered  bluntly.  The peritoneal incision was extended inferiorly with good  visualization of the bladder.  The bladder blade was inserted.  Vesicouterine peritoneum was identified, tented up, and entered sharply  with the Metzenbaum scissors.  The incision was extended laterally with  the Metzenbaums and the bladder flap was created digitally.  Of note, on  the lower uterine segment on the left-hand aspect of the uterus was  extremely thin and approximately a 2 x 2 cm window was noted in the  lower uterine segment.  The scalpel was then used to make a low  transverse incision on the uterus, which was extended laterally with  blunt dissection.  The uterus was explored, fetal vertex identified and  brought out to the uterine incision.  The fetal head was delivered  atraumatically and bulb-suctioned on the operative field.  The body was  then delivered, cord was clamped and cut, and the  infant cried  vigorously on the operative field.  The infant was passed to the waiting  pediatricians.  The placenta was then spontaneously delivered.  The  uterus was exteriorized, cleared of all clot and debris.  The uterine  incision was repaired with #1 chromic in a running locked fashion, and  several imbricating sutures were subsequently used on the uterine  segment to reapproximate the defect in the lower uterine segment and to  provide hemostasis.  Attention was then turned to the left tube, which  was grasped with a Babcock clamp, tented up, and tied off with 2-0 plain  gut suture x2 and the central portion of the fallopian tube was  transected and removed.  Attention was then turned to the right portion  of the right fallopian tube.  It was uncertain whether there was just  mesosalpinx present or a portion of tube, so this was grasped with a  Babcock, tented up.  A portion of tube  was then suture ligated x3 with 2-  0 plain gut and the intervening portion of tube was removed.  Given the  patient's history of either a right salpingo-oophorectomy or right  oophorectomy, it is not certain how much tube was remaining and  therefore the removed portion may represent a mesosalpinx versus tubal  portion.  Both partial salpingectomy sites were inspected and found to  be hemostatic.  The uterus was returned to the abdominal cavity.  The  abdominal cavity was cleared of all clot and debris.  The peritoneal  incision was closed with 2-0 Vicryl in a running fashion and the fascia  was reapproximated with 0 looped PDS in a running fashion and the skin  was closed with staples.  All sponge, lap and needle counts were correct  x2.  The patient tolerated the procedure well and returned and was  brought to the recovery room in stable condition.      Freddrick March. Tenny Craw, MD  Electronically Signed     KHR/MEDQ  D:  07/02/2006  T:  07/02/2006  Job:  [id number]

## 2010-11-07 NOTE — Assessment & Plan Note (Signed)
[facility]                           GASTROENTEROLOGY OFFICE NOTE   NAME:Galvan, Vickie Galvan                       MRN:          [id number]  DATE:03/31/2006                            DOB:          Aug 26, 1975    REFERRING PHYSICIAN:  Enrique Sack H. Tenny Craw, MD.   REASON FOR REFERRAL:  Dr. Tenny Craw asked me to evaluate [name] regarding  rectal bleeding and heme positive stool.   HISTORY OF PRESENT ILLNESS:  Vickie Galvan is a very pleasant, 35 year old  woman who is 24 weeks' pregnant who over the past 2-3 months has noticed  recurrent minor rectal bleeding.  She says she sees solid red, small blood  clots with a bowel movement approximately once to twice a week.  She has had  no dripping of blood.  She has had no bloody diarrhea.  She has had no  diarrhea of any kind and actually moves her bowels once or twice very  regularly.  This has been her bowel habits for many years.  She otherwise  feels very well.  She says she feels just the same as she does whenever she  is pregnant, which is some mild fatigue, and otherwise normal.  She has no  specific abdominal complaints other than abdominal fullness from the  pregnancy.   REVIEW OF SYSTEMS:  Essentially normal and is available on the nursing  intake sheet.   PAST MEDICAL HISTORY:  Status post C-section.   CURRENT MEDICATIONS:  Prenatal vitamins.   ALLERGIES:  No known drug allergies.   SOCIAL HISTORY:  Single with two children.  She lives with her grandmother,  nonsmoker and nondrinker.   FAMILY HISTORY:  No colon cancer, no polyps in family.   PHYSICAL EXAMINATION:  VITAL SIGNS:  Height 5 feet 5-1/2 inches, weight 153  pounds.  Blood pressure 126/70, pulse 84.  GENERAL:  Well-appearing.  NEUROLOGIC:  Alert and oriented x3.  HEENT:  Extraocular movements intact.  Oropharynx moist with no lesions.  NECK:  Supple, no lymphadenopathy.  CARDIAC:  Heart regular rate and rhythm.  LUNGS:  Clear to  auscultation bilaterally.  ABDOMEN:  Soft, appropriate size for her stated pregnancy.  Nontender,  nondistended.  Normal bowel sounds.  EXTREMITIES:  No lower extremity edema.  SKIN:  No rash or lesions in upper or lower extremities.  RECTAL:  With female nurse in room, no external hemorrhoids.  I do feel some  what feels like soft internal hemorrhoids.  No masses in rectum.  Stool was  brown and heme negative today.   ASSESSMENT/PLAN:  A 35 year old woman, 7 weeks' pregnant with her third  child with mild intermittent rectal bleeding.  This does not sound like  significant bleeding, but I will get a CBC today just to see what her  hemoglobin is.  Given that she is pregnant, I think it is safest just to  simply observe her clinically.  She will return to my office in 4-5 weeks  and knows how to get in touch if she has more dramatic bleeding.  I think it  is unlikely this is anything serious.  It is probably just the internal  hemorrhoids that are intermittently bleeding.  That being said, after she is  pregnant and everything goes well and she has no further problems, I think  she should undergo at least a sigmoidoscopy if not a full screening  colonoscopy.            ______________________________  Rachael Fee, MD      DPJ/MedQ  DD:  03/31/2006  DT:  04/02/2006  Job #:  [id number]   cc:   Freddrick March. Tenny Craw, MD

## 2011-01-22 ENCOUNTER — Other Ambulatory Visit: Payer: Self-pay

## 2011-01-22 MED ORDER — HYDROCHLOROTHIAZIDE 25 MG PO TABS: 25.0000 mg | ORAL_TABLET | Freq: Every day | ORAL | Status: DC

## 2011-03-16 ENCOUNTER — Telehealth: Payer: Self-pay | Admitting: Internal Medicine

## 2011-03-16 DIAGNOSIS — R5383 Other fatigue: Secondary | ICD-10-CM

## 2011-03-16 DIAGNOSIS — E049 Nontoxic goiter, unspecified: Secondary | ICD-10-CM

## 2011-03-16 NOTE — Telephone Encounter (Signed)
Done per emr 

## 2011-03-16 NOTE — Telephone Encounter (Signed)
Message copied by Corwin Levins on Mon Mar 16, 2011 12:30 PM ------      Message from: Scharlene Gloss B      Created: Mon Mar 16, 2011 10:38 AM      Regarding: Lab and Ultrasound       Patient would like Vitamin D added to lab order and also please schedule her thyroid US

## 2011-03-19 ENCOUNTER — Telehealth: Payer: Self-pay

## 2011-03-19 MED ORDER — CEPHALEXIN 500 MG PO CAPS: 500.0000 mg | ORAL_CAPSULE | Freq: Four times a day (QID) | ORAL | Status: AC

## 2011-03-19 NOTE — Telephone Encounter (Signed)
UA dip not very convincing, but given symptoms will:  1)  Cephalexin course x 7 days 2)  Urine cx  -  Please send specimen   -  3) OV if not improved, or consider GYN evaluation

## 2011-03-19 NOTE — Telephone Encounter (Signed)
Patient checked urine in lab, Leukocytes Negative, Blood small, 0-2 bact. Lt. epith many 3+, WBC 0-2. Patient has back pain, low abdominal pain and pressure. Pharmacy [facility] [address]

## 2011-03-19 NOTE — Telephone Encounter (Signed)
  Called patient informed of results and MD's instructions.

## 2011-03-20 ENCOUNTER — Other Ambulatory Visit: Payer: BC Managed Care – PPO

## 2011-04-06 ENCOUNTER — Other Ambulatory Visit: Payer: BC Managed Care – PPO

## 2011-04-10 ENCOUNTER — Ambulatory Visit: Payer: BC Managed Care – PPO | Admitting: Internal Medicine

## 2011-04-13 ENCOUNTER — Ambulatory Visit (INDEPENDENT_AMBULATORY_CARE_PROVIDER_SITE_OTHER): Payer: BC Managed Care – PPO | Admitting: Internal Medicine

## 2011-04-13 ENCOUNTER — Other Ambulatory Visit (INDEPENDENT_AMBULATORY_CARE_PROVIDER_SITE_OTHER): Payer: BC Managed Care – PPO

## 2011-04-13 ENCOUNTER — Encounter: Payer: Self-pay | Admitting: Internal Medicine

## 2011-04-13 ENCOUNTER — Telehealth: Payer: Self-pay

## 2011-04-13 ENCOUNTER — Other Ambulatory Visit: Payer: Self-pay | Admitting: Internal Medicine

## 2011-04-13 VITALS — BP 120/88 | HR 106 | Temp 100.2°F | Ht 65.0 in

## 2011-04-13 DIAGNOSIS — Z0000 Encounter for general adult medical examination without abnormal findings: Secondary | ICD-10-CM

## 2011-04-13 DIAGNOSIS — B9789 Other viral agents as the cause of diseases classified elsewhere: Secondary | ICD-10-CM

## 2011-04-13 DIAGNOSIS — R21 Rash and other nonspecific skin eruption: Secondary | ICD-10-CM

## 2011-04-13 DIAGNOSIS — B349 Viral infection, unspecified: Secondary | ICD-10-CM

## 2011-04-13 DIAGNOSIS — R509 Fever, unspecified: Secondary | ICD-10-CM

## 2011-04-13 DIAGNOSIS — R5383 Other fatigue: Secondary | ICD-10-CM

## 2011-04-13 LAB — URINALYSIS, ROUTINE W REFLEX MICROSCOPIC
Nitrite: NEGATIVE
Specific Gravity, Urine: 1.025 (ref 1.000–1.030)
Urine Glucose: NEGATIVE
Urobilinogen, UA: 0.2 (ref 0.0–1.0)

## 2011-04-13 LAB — CBC WITH DIFFERENTIAL/PLATELET
Basophils Absolute: 0 10*3/uL (ref 0.0–0.1)
Basophils Relative: 0.3 % (ref 0.0–3.0)
Hemoglobin: 12.4 g/dL (ref 12.0–15.0)
Lymphocytes Relative: 28 % (ref 12.0–46.0)
Monocytes Relative: 7.1 % (ref 3.0–12.0)
Neutro Abs: 3.9 10*3/uL (ref 1.4–7.7)
RBC: 3.84 Mil/uL — ABNORMAL LOW (ref 3.87–5.11)

## 2011-04-13 LAB — BASIC METABOLIC PANEL
CO2: 30 mEq/L (ref 19–32)
Calcium: 8.6 mg/dL (ref 8.4–10.5)
Creatinine, Ser: 0.6 mg/dL (ref 0.4–1.2)
Glucose, Bld: 97 mg/dL (ref 70–99)

## 2011-04-13 LAB — HEPATIC FUNCTION PANEL
Albumin: 3.7 g/dL (ref 3.5–5.2)
Total Protein: 8.3 g/dL (ref 6.0–8.3)

## 2011-04-13 LAB — LIPID PANEL
HDL: 39.9 mg/dL (ref >39.00)
Triglycerides: 101 mg/dL (ref 0.0–149.0)

## 2011-04-13 MED ORDER — POTASSIUM CHLORIDE ER 10 MEQ PO TBCR: 10.0000 meq | EXTENDED_RELEASE_TABLET | Freq: Two times a day (BID) | ORAL | Status: DC

## 2011-04-13 NOTE — Progress Notes (Signed)
  Subjective:    Patient ID: Vickie Galvan, female    DOB: September 09, 1975, 35 y.o.   MRN: [id number]  HPI  complains of rash Onset 5 days ago Describes as tiny red spots on arms and legs - associated with itch symptoms improved (rash) but still LFG - ?infx  Past Medical History  Diagnosis Date  . Anxiety   . Environmental allergies   . ANEMIA, IRON DEFICIENCY   . GERD   . GOITER, MULTINODULAR   . HYPERTENSION   . CONSTIPATION     Review of Systems  Constitutional: Negative for chills, fatigue and unexpected weight change.  HENT: Negative for ear pain, congestion, sneezing and neck pain.   Eyes: Negative for redness and visual disturbance.  Respiratory: Negative for cough, shortness of breath and wheezing.   Cardiovascular: Negative for chest pain.  Gastrointestinal: Positive for nausea. Negative for diarrhea.  Genitourinary: Negative for dysuria.  Neurological: Negative for headaches.       Objective:   Physical Exam BP 120/88  Pulse 106  Temp(Src) 100.2 F (37.9 C) (Oral)  Ht 5\' 5"  (1.651 m)  SpO2 96% Constitutional: She is well-developed and well-nourished. No distress.  Neck: Normal range of motion. Neck supple. No JVD present. No thyromegaly present.  Cardiovascular: Normal rate, regular rhythm and normal heart sounds.  No murmur heard. No BLE edema. Pulmonary/Chest: Effort normal and breath sounds normal. No respiratory distress. She has no wheezes.  Neurological: She is alert and oriented to person, place, and time. No cranial nerve deficit. Coordination normal.  Skin: Skin is warm and dry. No erythema. resolving small soft pink papules on extremities - spares chest/back and face Psychiatric: She has a normal mood and affect. Her behavior is normal. Judgment and thought content normal.   Lab Results  Component Value Date   WBC 7.3 10/30/2009   HGB 11.6* 10/30/2009   HCT 34.1* 10/30/2009   PLT 349.0 10/30/2009   GLUCOSE 97 03/01/2009   CHOL 161 08/03/2008   TRIG  44 08/03/2008   HDL 62.7 08/03/2008   LDLCALC 90 08/03/2008   ALT 15 03/01/2009   AST 20 03/01/2009   NA 141 03/01/2009   K 4.4 03/01/2009   CL 108 03/01/2009   CREATININE 0.7 03/01/2009   BUN 10 03/01/2009   CO2 28 03/01/2009   TSH 0.60 03/01/2009        Assessment & Plan:  Viral syndrome with rash and LGF - for labs today as scheduled for CPX (upcoming) - CBC and UA symptomatic tx recommended with tylenol/advil and hyrdrocortisone prn for itch

## 2011-04-13 NOTE — Patient Instructions (Signed)
It was good to see you today. Alternate between ibuprofen and tylenol for aches and fever symptoms as discussed:Hydrocortisone for itch as needed Have CPX labs done as discussed for CBC and UA- Your results will be called to you after review (48-72hours after test completion). If any changes need to be made, you will be notified at that time.

## 2011-04-13 NOTE — Telephone Encounter (Signed)
Ok to addon lab:   Acute hepatitis profile  790.4

## 2011-04-13 NOTE — Telephone Encounter (Signed)
Patient is aware of her lab results, her potassium is low and AST  And ALT is high. She has been taking tylenol and advil since Wednesday, please advise.

## 2011-04-14 ENCOUNTER — Ambulatory Visit: Payer: BC Managed Care – PPO

## 2011-04-14 NOTE — Telephone Encounter (Signed)
Sent addon to the lab 

## 2011-04-15 LAB — HEPATITIS PANEL, ACUTE
HCV Ab: NEGATIVE
Hep A IgM: NEGATIVE

## 2011-04-23 ENCOUNTER — Other Ambulatory Visit: Payer: BC Managed Care – PPO

## 2011-05-04 ENCOUNTER — Encounter: Payer: Self-pay | Admitting: Internal Medicine

## 2011-05-04 ENCOUNTER — Ambulatory Visit (INDEPENDENT_AMBULATORY_CARE_PROVIDER_SITE_OTHER): Payer: BC Managed Care – PPO | Admitting: Internal Medicine

## 2011-05-04 VITALS — BP 102/62 | HR 80 | Temp 98.1°F | Ht 65.0 in | Wt 144.5 lb

## 2011-05-04 DIAGNOSIS — Z0000 Encounter for general adult medical examination without abnormal findings: Secondary | ICD-10-CM | POA: Insufficient documentation

## 2011-05-04 DIAGNOSIS — R21 Rash and other nonspecific skin eruption: Secondary | ICD-10-CM

## 2011-05-04 DIAGNOSIS — I10 Essential (primary) hypertension: Secondary | ICD-10-CM

## 2011-05-04 MED ORDER — LOSARTAN POTASSIUM 100 MG PO TABS: 100.0000 mg | ORAL_TABLET | Freq: Every day | ORAL | Status: DC

## 2011-05-04 MED ORDER — POTASSIUM CHLORIDE ER 10 MEQ PO TBCR: 10.0000 meq | EXTENDED_RELEASE_TABLET | Freq: Every day | ORAL | Status: DC

## 2011-05-04 MED ORDER — TRIAMCINOLONE ACETONIDE 0.1 % EX CREA: TOPICAL_CREAM | Freq: Two times a day (BID) | CUTANEOUS | Status: DC

## 2011-05-04 NOTE — Assessment & Plan Note (Signed)

## 2011-05-04 NOTE — Assessment & Plan Note (Signed)
Ok to simplify tx - s/p BTL, not preganant;  To d/c the hcTZ/k and start losartan 100 mg qd,  to f/u any worsening symptoms or concerns

## 2011-05-04 NOTE — Patient Instructions (Addendum)
Take all new medications as prescribed - the steroid cream as needed OK to stop the HCTZ fluid pill after today Please continue the potassium pill 2 per day for 5 days only, then stop Tomorrow, please start the losartan 100 mg per day Please return in 1 months for LAB only:  BMET to re-check the K on the new treatment Please check your Blood Pressure on a regular basis; your goal is to be < 140/90;  Also please call if your BP is Too Low, such as 90-100 on the new medication (which is unlikely, but could happen) Continue all other medications as before Please have your Pharmacy call if you need further refills Please return in 1 year for your yearly visit, or sooner if needed, with Lab testing done 3-5 days before Robin to [facility] [address], to cancel the Potassium rx sent in today

## 2011-05-04 NOTE — Assessment & Plan Note (Signed)
mildl post left hand, seems to recur with cold weather - for steroid cream prn

## 2011-05-04 NOTE — Progress Notes (Signed)
Subjective:    Patient ID: Vickie Galvan, female    DOB: 23-Nov-1975, 35 y.o.   MRN: [id number]  HPI  Here for wellness and f/u;  Overall doing ok;  Pt denies CP, worsening SOB, DOE, wheezing, orthopnea, PND, worsening LE edema, palpitations, dizziness or syncope.  Pt denies neurological change such as new Headache, facial or extremity weakness.  Pt denies polydipsia, polyuria, or low sugar symptoms. Pt states overall good compliance with treatment and medications, good tolerability, and trying to follow lower cholesterol diet.  Pt denies worsening depressive symptoms, suicidal ideation or panic. No fever, wt loss, night sweats, loss of appetite, or other constitutional symptoms.  Pt states good ability with ADL's, low fall risk, home safety reviewed and adequate, no significant changes in hearing or vision, and occasionally active with exercise.  Did have small elev LFT's, and hep acute panel neg. Admits to missing mult dosing of her BID K pill.  Is taking the Vit d most days. Past Medical History  Diagnosis Date  . Anxiety   . Environmental allergies   . ANEMIA, IRON DEFICIENCY   . GERD   . GOITER, MULTINODULAR   . HYPERTENSION   . CONSTIPATION    No past surgical history on file.  reports that she has never smoked. She does not have any smokeless tobacco history on file. She reports that she drinks alcohol. She reports that she does not use illicit drugs. family history includes Diabetes in her maternal grandfather; Hypertension in her maternal grandfather and maternal grandmother; and Stroke in her maternal grandfather. No Known Allergies Current Outpatient Prescriptions on File Prior to Visit  Medication Sig Dispense Refill  . ALPRAZolam (XANAX) 0.25 MG tablet Take 1 tablet (0.25 mg total) by mouth 2 (two) times daily as needed for anxiety.  60 tablet  0  . fexofenadine (ALLEGRA) 180 MG tablet Take 180 mg by mouth daily as needed.        . fluticasone (FLONASE) 50 MCG/ACT nasal spray  Place 2 sprays into the nose daily as needed.        . hydrochlorothiazide 25 MG tablet Take 1 tablet (25 mg total) by mouth daily.  90 tablet  3  . DISCONTD: fexofenadine (ALLEGRA) 180 MG tablet Take 1 tablet (180 mg total) by mouth daily.  30 tablet  2  . DISCONTD: fluticasone (FLONASE) 50 MCG/ACT nasal spray 2 sprays by Nasal route daily.  16 g  2   Review of Systems Review of Systems  Constitutional: Negative for diaphoresis, activity change, appetite change and unexpected weight change.  HENT: Negative for hearing loss, ear pain, facial swelling, mouth sores and neck stiffness.   Eyes: Negative for pain, redness and visual disturbance.  Respiratory: Negative for shortness of breath and wheezing.   Cardiovascular: Negative for chest pain and palpitations.  Gastrointestinal: Negative for diarrhea, blood in stool, abdominal distention and rectal pain.  Genitourinary: Negative for hematuria, flank pain and decreased urine volume.  Musculoskeletal: Negative for myalgias and joint swelling.  Skin: Negative for color change and wound.  Neurological: Negative for syncope and numbness.  Hematological: Negative for adenopathy.  Psychiatric/Behavioral: Negative for hallucinations, self-injury, decreased concentration and agitation.      Objective:   Physical Exam BP 102/62  Pulse 80  Temp(Src) 98.1 F (36.7 C) (Oral)  Ht 5\' 5"  (1.651 m)  Wt 144 lb 8 oz (65.545 kg)  BMI 24.05 kg/m2  SpO2 99%  LMP 05/01/2011 Physical Exam  VS noted Constitutional: Pt is  oriented to person, place, and time. Appears well-developed and well-nourished.  HENT:  Head: Normocephalic and atraumatic.  Right Ear: External ear normal.  Left Ear: External ear normal.  Nose: Nose normal.  Mouth/Throat: Oropharynx is clear and moist.  Eyes: Conjunctivae and EOM are normal. Pupils are equal, round, and reactive to light.  Neck: Normal range of motion. Neck supple. No JVD present. No tracheal deviation present.    Cardiovascular: Normal rate, regular rhythm, normal heart sounds and intact distal pulses.   Pulmonary/Chest: Effort normal and breath sounds normal.  Abdominal: Soft. Bowel sounds are normal. There is no tenderness.  Musculoskeletal: Normal range of motion. Exhibits no edema.  Lymphadenopathy:  Has no cervical adenopathy.  Neurological: Pt is alert and oriented to person, place, and time. Pt has normal reflexes. No cranial nerve deficit.  Skin: Skin is warm and dry. No rash noted. except for mild eczema to post left hand, without tender or swelling Psychiatric:  Has  normal mood and affect. Behavior is normal.     Assessment & Plan:

## 2011-05-13 ENCOUNTER — Other Ambulatory Visit: Payer: BC Managed Care – PPO

## 2011-05-14 ENCOUNTER — Other Ambulatory Visit: Payer: BC Managed Care – PPO

## 2011-05-18 ENCOUNTER — Telehealth: Payer: Self-pay | Admitting: Internal Medicine

## 2011-05-18 ENCOUNTER — Ambulatory Visit
Admission: RE | Admit: 2011-05-18 | Discharge: 2011-05-18 | Disposition: A | Discharge status: Home / Self Care | Payer: BC Managed Care – PPO | Source: Ambulatory Visit | Attending: Internal Medicine | Admitting: Internal Medicine

## 2011-05-18 DIAGNOSIS — E041 Nontoxic single thyroid nodule: Secondary | ICD-10-CM

## 2011-05-18 DIAGNOSIS — E049 Nontoxic goiter, unspecified: Secondary | ICD-10-CM

## 2011-05-18 NOTE — Telephone Encounter (Signed)
Thyroid u/s shows one nodule enlarging  Ok to refer to Dr ellison/endo to determin next step if any needed  Zella Ball to notify pt

## 2011-05-19 NOTE — Telephone Encounter (Signed)
Patient informed. 

## 2011-05-20 ENCOUNTER — Encounter: Payer: Self-pay | Admitting: Endocrinology

## 2011-05-20 ENCOUNTER — Ambulatory Visit (INDEPENDENT_AMBULATORY_CARE_PROVIDER_SITE_OTHER): Payer: BC Managed Care – PPO | Admitting: Endocrinology

## 2011-05-20 DIAGNOSIS — E042 Nontoxic multinodular goiter: Secondary | ICD-10-CM

## 2011-05-20 MED ORDER — LOSARTAN POTASSIUM-HCTZ 100-12.5 MG PO TABS: 1.0000 | ORAL_TABLET | Freq: Every day | ORAL | Status: DC

## 2011-05-20 NOTE — Progress Notes (Signed)
Subjective:    Patient ID: Vickie Galvan, female    DOB: 03/03/1976, 35 y.o.   MRN: [id number]  HPI Pt says she was first noted to have a goiter, approx 2 years ago.  She does not notice the goiter.  She has few weeks of dryness of the skin, in the context of cold weather, and assoc itching. Past Medical History  Diagnosis Date  . Anxiety   . Environmental allergies   . ANEMIA, IRON DEFICIENCY   . GERD   . GOITER, MULTINODULAR   . HYPERTENSION   . CONSTIPATION     Past Surgical History  Procedure Date  . Tubal ligation     History   Social History  . Marital Status: Married    Spouse Name: N/A    Number of Children: N/A  . Years of Education: N/A   Occupational History  . Not on file.   Social History Main Topics  . Smoking status: Never Smoker   . Smokeless tobacco: Not on file  . Alcohol Use: Yes     occasional  . Drug Use: No  . Sexually Active: Not on file   Other Topics Concern  . Not on file   Social History Narrative  . No narrative on file    Current Outpatient Prescriptions on File Prior to Visit  Medication Sig Dispense Refill  . ALPRAZolam (XANAX) 0.25 MG tablet Take 1 tablet (0.25 mg total) by mouth 2 (two) times daily as needed for anxiety.  60 tablet  0  . fexofenadine (ALLEGRA) 180 MG tablet Take 180 mg by mouth daily as needed.        . fluticasone (FLONASE) 50 MCG/ACT nasal spray Place 2 sprays into the nose daily as needed.        Marland Kitchen losartan (COZAAR) 100 MG tablet Take 1 tablet (100 mg total) by mouth daily.  90 tablet  3  . DISCONTD: fexofenadine (ALLEGRA) 180 MG tablet Take 1 tablet (180 mg total) by mouth daily.  30 tablet  2  . DISCONTD: fluticasone (FLONASE) 50 MCG/ACT nasal spray 2 sprays by Nasal route daily.  16 g  2    No Known Allergies  Family History  Problem Relation Age of Onset  . Hypertension Maternal Grandmother   . Hypertension Maternal Grandfather   . Diabetes Maternal Grandfather   . Stroke Maternal Grandfather     no goiter or other thyroid probs.  BP 142/98  Pulse 93  Temp(Src) 99 F (37.2 C) (Oral)  Ht 5\' 5"  (1.651 m)  Wt 144 lb (65.318 kg)  BMI 23.96 kg/m2  SpO2 96%  LMP 05/01/2011    Review of Systems denies weight loss, headache, hoarseness, neck pain, double vision, palpitations, sob, diarrhea, polyuria, myalgias, excessive diaphoresis, numbness, tremor, anxiety, hypoglycemia, rhinorrhea, easy bruising, rhinorrhea, and dysphagia.    Objective:   Physical Exam VS: see vs page GEN: no distress HEAD: head: no deformity eyes: no periorbital swelling, no proptosis external nose and ears are normal mouth: no lesion seen NECK: thyroid is 5x normal size, with multinodular surface. CHEST WALL: no deformity LUNGS:  Clear to auscultation CV: reg rate and rhythm, no murmur ABD: abdomen is soft, nontender.  no hepatosplenomegaly.  not distended.  no hernia MUSCULOSKELETAL: muscle bulk and strength are grossly normal.  no obvious joint swelling.  gait is normal and steady EXTEMITIES: no deformity.  no ulcer on the feet.  feet are of normal color and temp.  no edema PULSES: dorsalis  pedis intact bilat.  no carotid bruit NEURO:  cn 2-12 grossly intact.   readily moves all 4's.  sensation is intact to touch on the feet SKIN:  Normal temperature, but diffusely dry.  No rash or suspicious lesion is visible.   NODES:  None palpable at the neck PSYCH: alert, oriented x3.  Does not appear anxious nor depressed.  Lab Results  Component Value Date   TSH 0.66 04/13/2011  (i reviewed ultrasound) (i reviewed pathol reports from 2010--left and isthmus nodules)    Assessment & Plan:  Multinodular goiter, which is usually hereditary.  She is at high risk for the development of hyperthyroidism. Dry skin, new, not thyroid-related elev bp is incidentally noted

## 2011-05-20 NOTE — Patient Instructions (Addendum)
most of the time, a "lumpy thyroid" will eventually become overactive.  this is usually a slow process, happening over the span of many years. Please return in 1 year. Change losartan to losartan-hctz, 100/12.5, 1 daily.  Please recheck blood pressure in a few weeks.

## 2011-05-21 ENCOUNTER — Telehealth: Payer: Self-pay

## 2011-05-21 NOTE — Telephone Encounter (Signed)
Called the patient left message to call back 

## 2011-05-21 NOTE — Telephone Encounter (Signed)
Please verify - it appears pt is taking 12.5 mg HCT as a part of her BP med, and K pill is not on the list  She may not need to take K pill if the HCT is only 12.5 mg  Ok to follow for now on current meds

## 2011-05-21 NOTE — Telephone Encounter (Signed)
The patient was seen by Dr. Everardo All on 05/20/2011, he changed her BP medication. The patient would like to know does she need to continue taking Potassium. Call back number is [phone]

## 2011-05-21 NOTE — Telephone Encounter (Signed)
Patient informed. 

## 2011-05-25 ENCOUNTER — Telehealth: Payer: Self-pay

## 2011-05-25 NOTE — Telephone Encounter (Signed)
Can she be more specific, b/c this would mean stopping a medication completely for BP (the losartan part of the hyzaar)  What part is not working?  Has she been checking her BP's?  Or has there been some kind of unusual symptoms?

## 2011-05-25 NOTE — Telephone Encounter (Signed)
The patient called to request to go back on HCTZ and potassium as she feels works better for her. Please advise

## 2011-05-26 MED ORDER — AZITHROMYCIN 250 MG PO TABS: ORAL_TABLET | ORAL | Status: AC

## 2011-05-26 NOTE — Telephone Encounter (Signed)
Does the patient then need to start taking klor con again?

## 2011-05-26 NOTE — Telephone Encounter (Signed)
Ok to cont meds as is per current med list

## 2011-05-26 NOTE — Telephone Encounter (Signed)
It would help K in that the HCT part is less (12.5 mg, instaed of the 25 mg HCTZ she was on);  The 12.5 mg is MUCH less likely to cause low K and she should need supplement at this time  [facility] for zpack x 1 this time only

## 2011-05-26 NOTE — Telephone Encounter (Signed)
Her concern was does the BP medication Dr. Everardo All put her on help her potassium?? ALSO, she is coughing (bad), drainage and fever. She needs antibiotic. She has missed already several days of work and cannot be out due to her points and would appreciate something sent in, please advise

## 2011-05-27 ENCOUNTER — Telehealth: Payer: Self-pay

## 2011-05-27 NOTE — Telephone Encounter (Signed)
Called the patient and she went to [facility] they informed her to take tylenol as needed for pain. Informed of Dr. Raphael Gibney message.

## 2011-05-27 NOTE — Telephone Encounter (Signed)
Patient informed. 

## 2011-05-27 NOTE — Telephone Encounter (Signed)
Ok , but zpack seems unlikely to be cause

## 2011-05-27 NOTE — Telephone Encounter (Signed)
Patient called and thinks having a reaction to zpac, she is having severe stomach pain and she informed she is going to leave work and go to the ER. Please advise

## 2012-02-12 ENCOUNTER — Telehealth: Payer: Self-pay

## 2012-02-12 MED ORDER — AZITHROMYCIN 250 MG PO TABS: ORAL_TABLET | ORAL | Status: DC

## 2012-02-12 NOTE — Telephone Encounter (Signed)
The patient had  Strep test completed at work and the results was positive.  The patients manager is requesting the patient go home today.  Patient is requesting a work note and antibiotic sent in to pharmacy. Please advise

## 2012-02-12 NOTE — Telephone Encounter (Signed)
Patient informed. 

## 2012-02-12 NOTE — Telephone Encounter (Signed)
zpack done erx  OK for note

## 2012-02-15 ENCOUNTER — Telehealth: Payer: Self-pay

## 2012-02-15 MED ORDER — CEPHALEXIN 250 MG/5ML PO SUSR: ORAL | Status: DC

## 2012-02-15 NOTE — Telephone Encounter (Signed)
Done per emr = to change to cephalexin susp

## 2012-02-15 NOTE — Telephone Encounter (Signed)
The patient has had a reaction to antibiotic prescribed on Friday.  Medication caused sharp abdominal pains.  Please advise on changing the medication.  The patient is requesting a liquid antibiotic if possible.

## 2012-02-15 NOTE — Telephone Encounter (Signed)
Patient informed. 

## 2012-05-21 ENCOUNTER — Other Ambulatory Visit: Payer: Self-pay | Admitting: Endocrinology

## 2012-05-24 ENCOUNTER — Other Ambulatory Visit (INDEPENDENT_AMBULATORY_CARE_PROVIDER_SITE_OTHER): Payer: BC Managed Care – PPO

## 2012-05-24 ENCOUNTER — Other Ambulatory Visit: Payer: Self-pay | Admitting: Internal Medicine

## 2012-05-24 ENCOUNTER — Encounter: Payer: Self-pay | Admitting: Internal Medicine

## 2012-05-24 DIAGNOSIS — Z0000 Encounter for general adult medical examination without abnormal findings: Secondary | ICD-10-CM

## 2012-05-24 LAB — HEPATIC FUNCTION PANEL
Albumin: 3.8 g/dL (ref 3.5–5.2)
Alkaline Phosphatase: 51 U/L (ref 39–117)
Bilirubin, Direct: 0 mg/dL (ref 0.0–0.3)
Total Bilirubin: 0.4 mg/dL (ref 0.3–1.2)

## 2012-05-24 LAB — BASIC METABOLIC PANEL
CO2: 27 mEq/L (ref 19–32)
Chloride: 99 mEq/L (ref 96–112)
Creatinine, Ser: 0.8 mg/dL (ref 0.4–1.2)

## 2012-05-24 LAB — CBC WITH DIFFERENTIAL/PLATELET
Basophils Absolute: 0 10*3/uL (ref 0.0–0.1)
Eosinophils Absolute: 0.1 10*3/uL (ref 0.0–0.7)
Lymphocytes Relative: 43.7 % (ref 12.0–46.0)
MCHC: 32.8 g/dL (ref 30.0–36.0)
Neutro Abs: 2.5 10*3/uL (ref 1.4–7.7)
Neutrophils Relative %: 44.7 % (ref 43.0–77.0)
Platelets: 307 10*3/uL (ref 150.0–400.0)
RDW: 15.5 % — ABNORMAL HIGH (ref 11.5–14.6)

## 2012-05-24 LAB — URINALYSIS, ROUTINE W REFLEX MICROSCOPIC
Ketones, ur: NEGATIVE
Specific Gravity, Urine: 1.02 (ref 1.000–1.030)
Total Protein, Urine: NEGATIVE
Urine Glucose: NEGATIVE

## 2012-05-24 LAB — LIPID PANEL
Total CHOL/HDL Ratio: 3
Triglycerides: 87 mg/dL (ref 0.0–149.0)

## 2012-05-24 LAB — LDL CHOLESTEROL, DIRECT: Direct LDL: 124.5 mg/dL

## 2012-05-24 MED ORDER — POTASSIUM CHLORIDE ER 10 MEQ PO TBCR: 10.0000 meq | EXTENDED_RELEASE_TABLET | Freq: Every day | ORAL | Status: DC

## 2012-05-25 ENCOUNTER — Telehealth: Payer: Self-pay

## 2012-05-25 NOTE — Telephone Encounter (Signed)
Patient informed. 

## 2012-05-25 NOTE — Telephone Encounter (Signed)
Ov due 

## 2012-05-25 NOTE — Telephone Encounter (Signed)
I will forward request to Dr Everardo All

## 2012-05-25 NOTE — Telephone Encounter (Signed)
The patient would like to know if she should see Dr. Everardo All yearly for her thyroid US?  Please advise as she stated it has increased in size

## 2012-06-08 ENCOUNTER — Encounter: Payer: Self-pay | Admitting: Endocrinology

## 2012-06-08 ENCOUNTER — Ambulatory Visit (INDEPENDENT_AMBULATORY_CARE_PROVIDER_SITE_OTHER): Payer: BC Managed Care – PPO | Admitting: Endocrinology

## 2012-06-08 VITALS — BP 122/72 | HR 76 | Temp 98.8°F | Wt 154.0 lb

## 2012-06-08 DIAGNOSIS — E042 Nontoxic multinodular goiter: Secondary | ICD-10-CM

## 2012-06-08 NOTE — Patient Instructions (Addendum)

## 2012-06-08 NOTE — Progress Notes (Signed)
  Subjective:    Patient ID: Vickie Galvan, female    DOB: 09-10-1975, 36 y.o.   MRN: [id number]  HPI Pt returns for f/u of multinodular goiter (dx'ed 2010--bx was benign then).  She has never been on medication for this.  pt states she feels well in general.  Past Medical History  Diagnosis Date  . Anxiety   . Environmental allergies   . ANEMIA, IRON DEFICIENCY   . GERD   . GOITER, MULTINODULAR   . HYPERTENSION   . CONSTIPATION     Past Surgical History  Procedure Date  . Tubal ligation     History   Social History  . Marital Status: Married    Spouse Name: N/A    Number of Children: N/A  . Years of Education: N/A   Occupational History  . Not on file.   Social History Main Topics  . Smoking status: Never Smoker   . Smokeless tobacco: Not on file  . Alcohol Use: Yes     Comment: occasional  . Drug Use: No  . Sexually Active: Not on file   Other Topics Concern  . Not on file   Social History Narrative  . No narrative on file    Current Outpatient Prescriptions on File Prior to Visit  Medication Sig Dispense Refill  . ALPRAZolam (XANAX) 0.25 MG tablet Take 1 tablet (0.25 mg total) by mouth 2 (two) times daily as needed for anxiety.  60 tablet  0  . cephALEXin (KEFLEX) 250 MG/5ML suspension 10 cc po four times per day for 10 days  400 mL  0  . fexofenadine (ALLEGRA) 180 MG tablet Take 180 mg by mouth daily as needed.        . fluticasone (FLONASE) 50 MCG/ACT nasal spray Place 2 sprays into the nose daily as needed.        Marland Kitchen losartan-hydrochlorothiazide (HYZAAR) 100-12.5 MG per tablet TAKE 1 TABLET BY MOUTH DAILY.  90 tablet  2  . potassium chloride (KLOR-CON 10) 10 MEQ tablet Take 1 tablet (10 mEq total) by mouth daily.  90 tablet  3    Allergies  Allergen Reactions  . Azithromycin Other (See Comments)    abd pain    Family History  Problem Relation Age of Onset  . Hypertension Maternal Grandmother   . Hypertension Maternal Grandfather   . Diabetes  Maternal Grandfather   . Stroke Maternal Grandfather     BP 122/72  Pulse 76  Temp 98.8 F (37.1 C) (Oral)  Wt 154 lb (69.854 kg)  SpO2 98%    Review of Systems Denies weight change    Objective:   Physical Exam VITAL SIGNS:  See vs page GENERAL: no distress NECK: thyroid is 5x normal size, with multinodular surface.    Lab Results  Component Value Date   TSH 0.18* 05/24/2012      Assessment & Plan:  Multinodular goiter, which is usually hereditary Hyperthyroidism, due to the goiter.

## 2012-06-30 ENCOUNTER — Other Ambulatory Visit: Payer: Self-pay

## 2012-06-30 DIAGNOSIS — F419 Anxiety disorder, unspecified: Secondary | ICD-10-CM

## 2012-06-30 MED ORDER — ALPRAZOLAM 0.25 MG PO TABS: 0.2500 mg | ORAL_TABLET | Freq: Two times a day (BID) | ORAL | Status: DC | PRN

## 2012-06-30 NOTE — Telephone Encounter (Signed)
Faxed hardcopy to pharmacy. 

## 2012-06-30 NOTE — Telephone Encounter (Signed)
Done hardcopy to [facility]bin  

## 2012-07-07 ENCOUNTER — Encounter (HOSPITAL_COMMUNITY)
Admission: RE | Admit: 2012-07-07 | Discharge: 2012-07-07 | Disposition: A | Discharge status: Home / Self Care | Payer: BC Managed Care – PPO | Source: Ambulatory Visit | Attending: Endocrinology | Admitting: Endocrinology

## 2012-07-07 DIAGNOSIS — E042 Nontoxic multinodular goiter: Secondary | ICD-10-CM

## 2012-07-08 ENCOUNTER — Encounter (HOSPITAL_COMMUNITY): Payer: Self-pay

## 2012-07-08 ENCOUNTER — Other Ambulatory Visit: Payer: Self-pay | Admitting: Endocrinology

## 2012-07-08 ENCOUNTER — Encounter (HOSPITAL_COMMUNITY)
Admission: RE | Admit: 2012-07-08 | Discharge: 2012-07-08 | Disposition: A | Discharge status: Home / Self Care | Payer: BC Managed Care – PPO | Source: Ambulatory Visit | Attending: Endocrinology | Admitting: Endocrinology

## 2012-07-08 DIAGNOSIS — E042 Nontoxic multinodular goiter: Secondary | ICD-10-CM

## 2012-07-08 MED ORDER — SODIUM PERTECHNETATE TC 99M INJECTION
11.0000 | Freq: Once | INTRAVENOUS | Status: AC | PRN
  Administered 2012-07-08: 11 via INTRAVENOUS

## 2012-07-08 MED ORDER — SODIUM IODIDE I 131 CAPSULE
14.3000 | Freq: Once | INTRAVENOUS | Status: AC | PRN
  Administered 2012-07-07: 14.3 via ORAL

## 2012-07-18 ENCOUNTER — Telehealth: Payer: Self-pay

## 2012-07-18 NOTE — Telephone Encounter (Signed)
Patient informed and declined appointment today as did not feel able to come in, but did say she would call and schedule tomorrow if felt better.

## 2012-07-18 NOTE — Telephone Encounter (Signed)
Ok for OV with regina now

## 2012-07-18 NOTE — Telephone Encounter (Signed)
The patient called to request Amoxicillin (Liquid) refill for ear infection (pain) sent in to [facility] [address]  Please advise [phone]

## 2012-07-19 ENCOUNTER — Ambulatory Visit (INDEPENDENT_AMBULATORY_CARE_PROVIDER_SITE_OTHER): Payer: BC Managed Care – PPO | Admitting: Internal Medicine

## 2012-07-19 ENCOUNTER — Encounter: Payer: Self-pay | Admitting: Internal Medicine

## 2012-07-19 VITALS — BP 118/82 | HR 105 | Temp 98.1°F | Ht 65.0 in | Wt 151.8 lb

## 2012-07-19 DIAGNOSIS — K5289 Other specified noninfective gastroenteritis and colitis: Secondary | ICD-10-CM

## 2012-07-19 DIAGNOSIS — K529 Noninfective gastroenteritis and colitis, unspecified: Secondary | ICD-10-CM

## 2012-07-19 MED ORDER — TRAMADOL HCL 50 MG PO TABS: 50.0000 mg | ORAL_TABLET | Freq: Three times a day (TID) | ORAL | Status: DC | PRN

## 2012-07-19 MED ORDER — ONDANSETRON 4 MG PO TBDP: 4.0000 mg | ORAL_TABLET | Freq: Three times a day (TID) | ORAL | Status: DC | PRN

## 2012-07-19 MED ORDER — DIPHENOXYLATE-ATROPINE 2.5-0.025 MG PO TABS: 1.0000 | ORAL_TABLET | Freq: Four times a day (QID) | ORAL | Status: DC | PRN

## 2012-07-19 NOTE — Patient Instructions (Signed)
Viral Gastroenteritis Viral gastroenteritis is also known as stomach flu. This condition affects the stomach and intestinal tract. It can cause sudden diarrhea and vomiting. The illness typically lasts 3 to 8 days. Most people develop an immune response that eventually gets rid of the virus. While this natural response develops, the virus can make you quite ill. CAUSES   Many different viruses can cause gastroenteritis, such as rotavirus or noroviruses. You can catch one of these viruses by consuming contaminated food or water. You may also catch a virus by sharing utensils or other personal items with an infected person or by touching a contaminated surface. SYMPTOMS   The most common symptoms are diarrhea and vomiting. These problems can cause a severe loss of body fluids (dehydration) and a body salt (electrolyte) imbalance. Other symptoms may include:  Fever.   Headache.   Fatigue.   Abdominal pain.  DIAGNOSIS   Your caregiver can usually diagnose viral gastroenteritis based on your symptoms and a physical exam. A stool sample may also be taken to test for the presence of viruses or other infections. TREATMENT   This illness typically goes away on its own. Treatments are aimed at rehydration. The most serious cases of viral gastroenteritis involve vomiting so severely that you are not able to keep fluids down. In these cases, fluids must be given through an intravenous line (IV). HOME CARE INSTRUCTIONS    Drink enough fluids to keep your urine clear or pale yellow. Drink small amounts of fluids frequently and increase the amounts as tolerated.   Ask your caregiver for specific rehydration instructions.   Avoid:   Foods high in sugar.   Alcohol.   Carbonated drinks.   Tobacco.   Juice.   Caffeine drinks.   Extremely hot or cold fluids.   Fatty, greasy foods.   Too much intake of anything at one time.   Dairy products until 24 to 48 hours after diarrhea stops.   You  may consume probiotics. Probiotics are active cultures of beneficial bacteria. They may lessen the amount and number of diarrheal stools in adults. Probiotics can be found in yogurt with active cultures and in supplements.   Wash your hands well to avoid spreading the virus.   Only take over-the-counter or prescription medicines for pain, discomfort, or fever as directed by your caregiver. Do not give aspirin to children. Antidiarrheal medicines are not recommended.   Ask your caregiver if you should continue to take your regular prescribed and over-the-counter medicines.   Keep all follow-up appointments as directed by your caregiver.  SEEK IMMEDIATE MEDICAL CARE IF:    You are unable to keep fluids down.   You do not urinate at least once every 6 to 8 hours.   You develop shortness of breath.   You notice blood in your stool or vomit. This may look like coffee grounds.   You have abdominal pain that increases or is concentrated in one small area (localized).   You have persistent vomiting or diarrhea.   You have a fever.   The patient is a child younger than 3 months, and he or she has a fever.   The patient is a child older than 3 months, and he or she has a fever and persistent symptoms.   The patient is a child older than 3 months, and he or she has a fever and symptoms suddenly get worse.   The patient is a baby, and he or she has no tears when  crying.  MAKE SURE YOU:    Understand these instructions.   Will watch your condition.   Will get help right away if you are not doing well or get worse.  Document Released: 06/08/2005 Document Revised: 08/31/2011 Document Reviewed: 03/25/2011 [facility] Patient Information 2013 [place], [place].   Norovirus Infection Norovirus illness is caused by a viral infection. The term norovirus refers to a group of viruses. Any of those viruses can cause norovirus illness. This illness is often referred to by other names such as viral  gastroenteritis, stomach flu, and food poisoning. Anyone can get a norovirus infection. People can have the illness multiple times during their lifetime. CAUSES   Norovirus is found in the stool or vomit of infected people. It is easily spread from person to person (contagious). People with norovirus are contagious from the moment they begin feeling ill. They may remain contagious for as long as 3 days to 2 weeks after recovery. People can become infected with the virus in several ways. This includes:  Eating food or drinking liquids that are contaminated with norovirus.   Touching surfaces or objects contaminated with norovirus, and then placing your hand in your mouth.   Having direct contact with a person who is infected and shows symptoms. This may occur while caring for someone with illness or while sharing foods or eating utensils with someone who is ill.  SYMPTOMS   Symptoms usually begin 1 to 2 days after ingestion of the virus. Symptoms may include:  Nausea.   Vomiting.   Diarrhea.   Stomach cramps.   Low-grade fever.   Chills.   Headache.   Muscle aches.   Tiredness.  Most people with norovirus illness get better within 1 to 2 days. Some people become dehydrated because they cannot drink enough liquids to replace those lost from vomiting and diarrhea. This is especially true for young children, the elderly, and others who are unable to care for themselves. DIAGNOSIS   Diagnosis is based on your symptoms and exam. Currently, only state public health laboratories have the ability to test for norovirus in stool or vomit. TREATMENT   No specific treatment exists for norovirus infections. No vaccine is available to prevent infections. Norovirus illness is usually brief in healthy people. If you are ill with vomiting and diarrhea, you should drink enough water and fluids to keep your urine clear or pale yellow. Dehydration is the most serious health effect that can result from  this infection. By drinking oral rehydration solution (ORS), people can reduce their chance of becoming dehydrated. There are many commercially available pre-made and powdered ORS designed to safely rehydrate people. These may be recommended by your caregiver. Replace any new fluid losses from diarrhea or vomiting with ORS as follows:  If your child weighs 10 kg or less (22 lb or less), give 60 to 120 ml ( to  cup or 2 to 4 oz) of ORS for each diarrheal stool or vomiting episode.   If your child weighs more than 10 kg (more than 22 lb), give 120 to 240 ml ( to 1 cup or 4 to 8 oz) of ORS for each diarrheal stool or vomiting episode.  HOME CARE INSTRUCTIONS    Follow all your caregiver's instructions.   Avoid sugar-free and alcoholic drinks while ill.   Only take over-the-counter or prescription medicines for pain, vomiting, diarrhea, or fever as directed by your caregiver.  You can decrease your chances of coming in contact with norovirus or spreading  it by following these steps:  Frequently wash your hands, especially after using the toilet, changing diapers, and before eating or preparing food.   Carefully wash fruits and vegetables. Cook shellfish before eating them.   Do not prepare food for others while you are infected and for at least 3 days after recovering from illness.   Thoroughly clean and disinfect contaminated surfaces immediately after an episode of illness using a bleach-based household cleaner.   Immediately remove and wash clothing or linens that may be contaminated with the virus.   Use the toilet to dispose of any vomit or stool. Make sure the surrounding area is kept clean.   Food that may have been contaminated by an ill person should be discarded.  SEEK IMMEDIATE MEDICAL CARE IF:    You develop symptoms of dehydration that do not improve with fluid replacement. This may include:   Excessive sleepiness.   Lack of tears.   Dry mouth.   Dizziness when  standing.   Weak pulse.  Document Released: 08/29/2002 Document Revised: 08/31/2011 Document Reviewed: 09/30/2009 [facility] Patient Information 2013 [place], [place].

## 2012-07-19 NOTE — Progress Notes (Signed)
  Subjective:    Patient ID: Vickie Galvan, female    DOB: Nov 09, 1975, 37 y.o.   MRN: [id number]  HPI  Pt presents to the clinic today with c/o crampy abdominal pain, diarrhea, nausea and vomiting x 3 days. Her son came home with similar symptoms last week. Her husband has also now started having the same symptoms. She did have fever 2 days ago which was relieved with tylenol. She denies dizziness, or blood in her stool.  Review of Systems      Past Medical History  Diagnosis Date  . Anxiety   . Environmental allergies   . ANEMIA, IRON DEFICIENCY   . GERD   . GOITER, MULTINODULAR   . HYPERTENSION   . CONSTIPATION       Allergies  Allergen Reactions  . Azithromycin Other (See Comments)    abd pain    Family History  Problem Relation Age of Onset  . Hypertension Maternal Grandmother   . Hypertension Maternal Grandfather   . Diabetes Maternal Grandfather   . Stroke Maternal Grandfather     History   Social History  . Marital Status: Married    Spouse Name: N/A    Number of Children: N/A  . Years of Education: N/A   Occupational History  . Not on file.   Social History Main Topics  . Smoking status: Never Smoker   . Smokeless tobacco: Not on file  . Alcohol Use: Yes     Comment: occasional  . Drug Use: No  . Sexually Active: Not on file   Other Topics Concern  . Not on file   Social History Narrative  . No narrative on file     Constitutional: Pt reports fever and fatigue. Denies malaise, headache or abrupt weight changes.  Gastrointestinal: Pt reports abdominal pain, diarrhea, nausea and vomiting. Denies  bloating, constipation or blood in the stool.  Neurological: Denies dizziness, difficulty with memory, difficulty with speech or problems with balance and coordination.   No other specific complaints in a complete review of systems (except as listed in HPI above).  Objective:   Physical Exam   BP 118/82  Pulse 105  Temp 98.1 F (36.7 C)  (Oral)  Ht 5\' 5"  (1.651 m)  Wt 151 lb 12.8 oz (68.856 kg)  BMI 25.26 kg/m2  SpO2 99%  LMP 06/21/2012 Wt Readings from Last 3 Encounters:  07/19/12 151 lb 12.8 oz (68.856 kg)  06/08/12 154 lb (69.854 kg)  05/20/11 144 lb (65.318 kg)    General: Appears her stated age, well developed, well nourished in NAD.  Cardiovascular: Normal rate and rhythm. S1,S2 noted.  No murmur, rubs or gallops noted. No JVD or BLE edema. No carotid bruits noted. Pulmonary/Chest: Normal effort and positive vesicular breath sounds. No respiratory distress. No wheezes, rales or ronchi noted.  Abdomen: Soft and nontender. Normal bowel sounds, no bruits noted. No distention or masses noted. Liver, spleen and kidneys non palpable. Neurological: Alert and oriented. Cranial nerves II-XII intact. Coordination normal. +DTRs bilaterally.       Assessment & Plan:  Viral Gastroeneteritis, likely due to Norovirus, new onset with additional workup required:  Get rest and stay well hydrated eRx for Zofran, tramadol and lomotil Wash hand thoroughly and clean house with lysol/clorox  RTC as needed or if symptoms persist

## 2012-07-22 ENCOUNTER — Encounter (HOSPITAL_COMMUNITY)
Admission: RE | Admit: 2012-07-22 | Discharge: 2012-07-22 | Disposition: A | Discharge status: Home / Self Care | Payer: BC Managed Care – PPO | Source: Ambulatory Visit | Attending: Endocrinology | Admitting: Endocrinology

## 2012-07-22 DIAGNOSIS — E042 Nontoxic multinodular goiter: Secondary | ICD-10-CM | POA: Insufficient documentation

## 2012-07-22 MED ORDER — SODIUM IODIDE I 131 CAPSULE
30.5000 | Freq: Once | INTRAVENOUS | Status: AC | PRN
  Administered 2012-07-22: 30.5 via ORAL

## 2012-07-27 DIAGNOSIS — Z0279 Encounter for issue of other medical certificate: Secondary | ICD-10-CM

## 2012-07-28 ENCOUNTER — Telehealth: Payer: Self-pay

## 2012-07-28 ENCOUNTER — Encounter: Payer: Self-pay | Admitting: Internal Medicine

## 2012-07-28 DIAGNOSIS — J020 Streptococcal pharyngitis: Secondary | ICD-10-CM

## 2012-07-28 MED ORDER — AMOXICILLIN 250 MG/5ML PO SUSR: 500.0000 mg | Freq: Three times a day (TID) | ORAL | Status: DC

## 2012-07-28 NOTE — Telephone Encounter (Signed)
PATIENT NOTIFIED OF NEED TO GO SEE DR. JOHN FOR SYMPTOMS OF SORE THROAT PER DR. ELLISON.

## 2012-07-28 NOTE — Patient Instructions (Addendum)
Sore throat  Plan Amoxicillin as directed - 500 mg/10 cc  Three times a day  Comfort care: gargle of choice, tylenol for any fever or pain.  Strep Throat Strep throat is an infection of the throat caused by a bacteria named Streptococcus pyogenes. Your caregiver may call the infection streptococcal "tonsillitis" or "pharyngitis" depending on whether there are signs of inflammation in the tonsils or back of the throat. Strep throat is most common in children from 32 to 58 years old during the cold months of the year, but it can occur in people of any age during any season. This infection is spread from person to person (contagious) through coughing, sneezing, or other close contact. SYMPTOMS    Fever or chills.   Painful, swollen, red tonsils or throat.   Pain or difficulty when swallowing.   White or yellow spots on the tonsils or throat.   Swollen, tender lymph nodes or "glands" of the neck or under the jaw.   Red rash all over the body (rare).  DIAGNOSIS   Many different infections can cause the same symptoms. A test must be done to confirm the diagnosis so the right treatment can be given. A "rapid strep test" can help your caregiver make the diagnosis in a few minutes. If this test is not available, a light swab of the infected area can be used for a throat culture test. If a throat culture test is done, results are usually available in a day or two. TREATMENT   Strep throat is treated with antibiotic medicine. HOME CARE INSTRUCTIONS    Gargle with 1 tsp of salt in 1 cup of warm water, 3 to 4 times per day or as needed for comfort.   Family members who also have a sore throat or fever should be tested for strep throat and treated with antibiotics if they have the strep infection.   Make sure everyone in your household washes their hands well.   Do not share food, drinking cups, or personal items that could cause the infection to spread to others.   You may need to eat a soft food  diet until your sore throat gets better.   Drink enough water and fluids to keep your urine clear or pale yellow. This will help prevent dehydration.   Get plenty of rest.   Stay home from school, daycare, or work until you have been on antibiotics for 24 hours.   Only take over-the-counter or prescription medicines for pain, discomfort, or fever as directed by your caregiver.   If antibiotics are prescribed, take them as directed. Finish them even if you start to feel better.  SEEK MEDICAL CARE IF:    The glands in your neck continue to enlarge.   You develop a rash, cough, or earache.   You cough up green, yellow-brown, or bloody sputum.   You have pain or discomfort not controlled by medicines.   Your problems seem to be getting worse rather than better.  SEEK IMMEDIATE MEDICAL CARE IF:    You develop any new symptoms such as vomiting, severe headache, stiff or painful neck, chest pain, shortness of breath, or trouble swallowing.   You develop severe throat pain, drooling, or changes in your voice.   You develop swelling of the neck, or the skin on the neck becomes red and tender.   You have a fever.   You develop signs of dehydration, such as fatigue, dry mouth, and decreased urination.   You become  increasingly sleepy, or you cannot wake up completely.  Document Released: 06/05/2000 Document Revised: 08/31/2011 Document Reviewed: 08/07/2010 [facility] Patient Information 2013 [place], [place].

## 2012-07-28 NOTE — Telephone Encounter (Signed)
Given the dose of radioactive iodine, it is unlikely related to the i-131 rx. Please see dr Jonny Ruiz for sxs, as you may have strep infection

## 2012-07-28 NOTE — Telephone Encounter (Signed)
Pt states she has a bad sore throat, it's more like a pain in the throat pt fells like she has been "punched in the throat". Pt had the radioactive iodine test last Friday, she is and is aware that this may be a side effect, but she states it's not like a regular sore throat. * pt works in lab at [employer]*

## 2012-07-28 NOTE — Progress Notes (Signed)
  Subjective:    Patient ID: Vickie Galvan, female    DOB: Jan 20, 1976, 37 y.o.   MRN: [id number]  HPI Vickie Galvan presents acutely for sore throat x 3 days. She did a rapid step test in the lab which was positive. No fevers. Mild odynophagia. Otherwise feeling ok.  Past Medical History  Diagnosis Date  . Anxiety   . Environmental allergies   . ANEMIA, IRON DEFICIENCY   . GERD   . GOITER, MULTINODULAR   . HYPERTENSION   . CONSTIPATION    Past Surgical History  Procedure Date  . Tubal ligation    Family History  Problem Relation Age of Onset  . Hypertension Maternal Grandmother   . Hypertension Maternal Grandfather   . Diabetes Maternal Grandfather   . Stroke Maternal Grandfather    History   Social History  . Marital Status: Married    Spouse Name: N/A    Number of Children: N/A  . Years of Education: N/A   Occupational History  . Not on file.   Social History Main Topics  . Smoking status: Never Smoker   . Smokeless tobacco: Not on file  . Alcohol Use: Yes     Comment: occasional  . Drug Use: No  . Sexually Active: Not on file   Other Topics Concern  . Not on file   Social History Narrative  . No narrative on file    Current Outpatient Prescriptions on File Prior to Visit  Medication Sig Dispense Refill  . ALPRAZolam (XANAX) 0.25 MG tablet Take 1 tablet (0.25 mg total) by mouth 2 (two) times daily as needed for anxiety.  60 tablet  0  . diphenoxylate-atropine (LOMOTIL) 2.5-0.025 MG per tablet Take 1 tablet by mouth 4 (four) times daily as needed for diarrhea or loose stools.  30 tablet  0  . fexofenadine (ALLEGRA) 180 MG tablet Take 180 mg by mouth daily as needed.        . fluticasone (FLONASE) 50 MCG/ACT nasal spray Place 2 sprays into the nose daily as needed.        Marland Kitchen losartan-hydrochlorothiazide (HYZAAR) 100-12.5 MG per tablet TAKE 1 TABLET BY MOUTH DAILY.  90 tablet  2  . ondansetron (ZOFRAN-ODT) 4 MG disintegrating tablet Take 1 tablet (4 mg total) by  mouth every 8 (eight) hours as needed for nausea.  20 tablet  0  . potassium chloride (KLOR-CON 10) 10 MEQ tablet Take 1 tablet (10 mEq total) by mouth daily.  90 tablet  3  . traMADol (ULTRAM) 50 MG tablet Take 1 tablet (50 mg total) by mouth every 8 (eight) hours as needed for pain.  30 tablet  0      Review of Systems System review is negative for any constitutional, cardiac, pulmonary, GI or neuro symptoms or complaints other than as described in the HPI.    Objective:   Physical Exam 98.8 Gen'l -WNWD AA woman in no distress HEENT- mild erythema posterior pharynx Neck - enlarged thyroid. Nodes - negative Cor - RRR Pulm - CTAP       Assessment & Plan:  Pharyngitis - strep screen positive Plan - amoxicillin liquid  Comfort care

## 2012-07-29 ENCOUNTER — Other Ambulatory Visit: Payer: Self-pay

## 2012-07-29 MED ORDER — AMOXICILLIN 250 MG/5ML PO SUSR: 500.0000 mg | Freq: Three times a day (TID) | ORAL | Status: DC

## 2012-08-02 ENCOUNTER — Telehealth: Payer: Self-pay

## 2012-08-02 MED ORDER — DOXYCYCLINE HYCLATE 100 MG PO TABS: 100.0000 mg | ORAL_TABLET | Freq: Two times a day (BID) | ORAL | Status: DC

## 2012-08-02 NOTE — Telephone Encounter (Signed)
The patient had a positive strep test last Thursday and was prescribed liquid amoxicillin per Dr. Debby Bud.  The patient has almost complete rx and throat/ears still hurting, she had another strep test done today and was positive.  Please advise as states throat feels like somebody "punched" her in the throat

## 2012-08-02 NOTE — Telephone Encounter (Signed)
Ok for doxy course - done erx 

## 2012-08-03 NOTE — Telephone Encounter (Signed)
Patient informed. 

## 2012-09-23 ENCOUNTER — Encounter: Payer: Self-pay | Admitting: Endocrinology

## 2012-09-23 ENCOUNTER — Ambulatory Visit (INDEPENDENT_AMBULATORY_CARE_PROVIDER_SITE_OTHER): Payer: BC Managed Care – PPO | Admitting: Endocrinology

## 2012-09-23 VITALS — BP 126/70 | HR 75 | Wt 154.0 lb

## 2012-09-23 DIAGNOSIS — E042 Nontoxic multinodular goiter: Secondary | ICD-10-CM

## 2012-09-23 NOTE — Patient Instructions (Addendum)
blood tests are being requested for you today.  We'll contact you with results. Please come back for a follow-up appointment in 6 weeks.   

## 2012-09-23 NOTE — Progress Notes (Signed)
Subjective:    Patient ID: Vickie Galvan, female    DOB: 1975/11/05, 37 y.o.   MRN: [id number]  HPI Pt returns for f/u of multinodular goiter (dx'ed 2010--bx was benign then).  She has never been on medication for this.  pt states she feels well in general.  Pt says she can feel the goiter, but there is no change in the size.  When she developed a suppressed TSH in late 2013, she was rx'ed with i-131 in February of 2014.  pt states she feels well in general, except for fatigue. Past Medical History  Diagnosis Date  . Anxiety   . Environmental allergies   . ANEMIA, IRON DEFICIENCY   . GERD   . GOITER, MULTINODULAR   . HYPERTENSION   . CONSTIPATION     Past Surgical History  Procedure Laterality Date  . Tubal ligation      History   Social History  . Marital Status: Married    Spouse Name: N/A    Number of Children: N/A  . Years of Education: N/A   Occupational History  . Not on file.   Social History Main Topics  . Smoking status: Never Smoker   . Smokeless tobacco: Not on file  . Alcohol Use: Yes     Comment: occasional  . Drug Use: No  . Sexually Active: Not on file   Other Topics Concern  . Not on file   Social History Narrative  . No narrative on file    Current Outpatient Prescriptions on File Prior to Visit  Medication Sig Dispense Refill  . ALPRAZolam (XANAX) 0.25 MG tablet Take 1 tablet (0.25 mg total) by mouth 2 (two) times daily as needed for anxiety.  60 tablet  0  . diphenoxylate-atropine (LOMOTIL) 2.5-0.025 MG per tablet Take 1 tablet by mouth 4 (four) times daily as needed for diarrhea or loose stools.  30 tablet  0  . doxycycline (VIBRA-TABS) 100 MG tablet Take 1 tablet (100 mg total) by mouth 2 (two) times daily.  20 tablet  0  . fexofenadine (ALLEGRA) 180 MG tablet Take 180 mg by mouth daily as needed.        . fluticasone (FLONASE) 50 MCG/ACT nasal spray Place 2 sprays into the nose daily as needed.        Marland Kitchen losartan-hydrochlorothiazide  (HYZAAR) 100-12.5 MG per tablet TAKE 1 TABLET BY MOUTH DAILY.  90 tablet  2  . ondansetron (ZOFRAN-ODT) 4 MG disintegrating tablet Take 1 tablet (4 mg total) by mouth every 8 (eight) hours as needed for nausea.  20 tablet  0  . potassium chloride (KLOR-CON 10) 10 MEQ tablet Take 1 tablet (10 mEq total) by mouth daily.  90 tablet  3  . traMADol (ULTRAM) 50 MG tablet Take 1 tablet (50 mg total) by mouth every 8 (eight) hours as needed for pain.  30 tablet  0  . amoxicillin (AMOXIL) 250 MG/5ML suspension Take 10 mLs (500 mg total) by mouth 3 (three) times daily.  200 mL  0   No current facility-administered medications on file prior to visit.    Allergies  Allergen Reactions  . Azithromycin Other (See Comments)    abd pain    Family History  Problem Relation Age of Onset  . Hypertension Maternal Grandmother   . Hypertension Maternal Grandfather   . Diabetes Maternal Grandfather   . Stroke Maternal Grandfather     BP 126/70  Pulse 75  Wt 154 lb (69.854 kg)  BMI 25.63 kg/m2  SpO2 97%  Review of Systems Denies weight change    Objective:   Physical Exam VITAL SIGNS:  See vs page GENERAL: no distress NECK: thyroid is 10x normal size, with multinodular surface.       Assessment & Plan:  S/p i-131 rx of hyperthyroidism, ready for recheck of TSH

## 2012-09-26 LAB — T4, FREE: Free T4: 0.69 ng/dL (ref 0.60–1.60)

## 2012-09-26 NOTE — Progress Notes (Signed)
Tried to call x 1 - no answer. Vickie Galvan try again later.

## 2012-09-28 ENCOUNTER — Telehealth: Payer: Self-pay | Admitting: Endocrinology

## 2012-09-28 NOTE — Progress Notes (Signed)
Spoke w/ pt, she will call back to schedule appt / Sherri

## 2012-09-28 NOTE — Telephone Encounter (Signed)
Pt needs to schedule 6 week follow up appt. Spoke w. Pt, made her aware of this, she will call to schedule appt / Sherri S.

## 2012-11-09 ENCOUNTER — Ambulatory Visit: Payer: BC Managed Care – PPO | Admitting: Endocrinology

## 2012-11-16 ENCOUNTER — Encounter: Payer: Self-pay | Admitting: Endocrinology

## 2012-11-16 ENCOUNTER — Ambulatory Visit (INDEPENDENT_AMBULATORY_CARE_PROVIDER_SITE_OTHER): Payer: BC Managed Care – PPO | Admitting: Endocrinology

## 2012-11-16 VITALS — BP 128/74 | HR 76 | Ht 65.0 in | Wt 151.0 lb

## 2012-11-16 DIAGNOSIS — E042 Nontoxic multinodular goiter: Secondary | ICD-10-CM

## 2012-11-16 NOTE — Patient Instructions (Addendum)
blood tests are being requested for you today.  We'll contact you with results. Please come back for a follow-up appointment in 6 weeks.   

## 2012-11-16 NOTE — Progress Notes (Signed)
Subjective:    Patient ID: Vickie Galvan, female    DOB: 1975/12/22, 37 y.o.   MRN: [id number]  HPI Pt returns for f/u of multinodular goiter (dx'ed 2010--bx was benign then).  she developed a suppressed TSH in late 2013, and was rx'ed with i-131 in February of 2014.  Since then, pt states she feels no different, and well in general. Past Medical History  Diagnosis Date  . Anxiety   . Environmental allergies   . ANEMIA, IRON DEFICIENCY   . GERD   . GOITER, MULTINODULAR   . HYPERTENSION   . CONSTIPATION     Past Surgical History  Procedure Laterality Date  . Tubal ligation      History   Social History  . Marital Status: Married    Spouse Name: N/A    Number of Children: N/A  . Years of Education: N/A   Occupational History  . Not on file.   Social History Main Topics  . Smoking status: Never Smoker   . Smokeless tobacco: Not on file  . Alcohol Use: Yes     Comment: occasional  . Drug Use: No  . Sexually Active: Not on file   Other Topics Concern  . Not on file   Social History Narrative  . No narrative on file    Current Outpatient Prescriptions on File Prior to Visit  Medication Sig Dispense Refill  . ALPRAZolam (XANAX) 0.25 MG tablet Take 1 tablet (0.25 mg total) by mouth 2 (two) times daily as needed for anxiety.  60 tablet  0  . amoxicillin (AMOXIL) 250 MG/5ML suspension Take 10 mLs (500 mg total) by mouth 3 (three) times daily.  200 mL  0  . diphenoxylate-atropine (LOMOTIL) 2.5-0.025 MG per tablet Take 1 tablet by mouth 4 (four) times daily as needed for diarrhea or loose stools.  30 tablet  0  . doxycycline (VIBRA-TABS) 100 MG tablet Take 1 tablet (100 mg total) by mouth 2 (two) times daily.  20 tablet  0  . fexofenadine (ALLEGRA) 180 MG tablet Take 180 mg by mouth daily as needed.        . fluticasone (FLONASE) 50 MCG/ACT nasal spray Place 2 sprays into the nose daily as needed.        Marland Kitchen losartan-hydrochlorothiazide (HYZAAR) 100-12.5 MG per tablet TAKE  1 TABLET BY MOUTH DAILY.  90 tablet  2  . ondansetron (ZOFRAN-ODT) 4 MG disintegrating tablet Take 1 tablet (4 mg total) by mouth every 8 (eight) hours as needed for nausea.  20 tablet  0  . potassium chloride (KLOR-CON 10) 10 MEQ tablet Take 1 tablet (10 mEq total) by mouth daily.  90 tablet  3  . traMADol (ULTRAM) 50 MG tablet Take 1 tablet (50 mg total) by mouth every 8 (eight) hours as needed for pain.  30 tablet  0   No current facility-administered medications on file prior to visit.    Allergies  Allergen Reactions  . Azithromycin Other (See Comments)    abd pain    Family History  Problem Relation Age of Onset  . Hypertension Maternal Grandmother   . Hypertension Maternal Grandfather   . Diabetes Maternal Grandfather   . Stroke Maternal Grandfather     BP 128/74  Pulse 76  Ht 5\' 5"  (1.651 m)  Wt 151 lb (68.493 kg)  BMI 25.13 kg/m2  SpO2 98%   Review of Systems Denies weight change    Objective:   Physical Exam VITAL SIGNS:  See vs page GENERAL: no distress head: no deformity eyes: no periorbital swelling, but there is bilateral proptosis external nose and ears are normal mouth: no lesion seen NECK: thyroid is 10x normal size, with multinodular surface.        Assessment & Plan:  Multinodular goiter, unchanged Hyperthyroidism, due to the goiter, s/p i-131 rx

## 2012-12-07 ENCOUNTER — Ambulatory Visit (INDEPENDENT_AMBULATORY_CARE_PROVIDER_SITE_OTHER): Payer: BC Managed Care – PPO | Admitting: Internal Medicine

## 2012-12-07 ENCOUNTER — Encounter: Payer: Self-pay | Admitting: Internal Medicine

## 2012-12-07 VITALS — BP 120/80 | HR 104 | Temp 98.3°F | Ht 65.0 in | Wt 150.2 lb

## 2012-12-07 DIAGNOSIS — Z0000 Encounter for general adult medical examination without abnormal findings: Secondary | ICD-10-CM

## 2012-12-07 DIAGNOSIS — J019 Acute sinusitis, unspecified: Secondary | ICD-10-CM

## 2012-12-07 DIAGNOSIS — I10 Essential (primary) hypertension: Secondary | ICD-10-CM

## 2012-12-07 DIAGNOSIS — Z9109 Other allergy status, other than to drugs and biological substances: Secondary | ICD-10-CM

## 2012-12-07 MED ORDER — MECLIZINE HCL 12.5 MG PO TABS: 12.5000 mg | ORAL_TABLET | Freq: Three times a day (TID) | ORAL | Status: DC | PRN

## 2012-12-07 MED ORDER — LEVOFLOXACIN 250 MG PO TABS: 250.0000 mg | ORAL_TABLET | Freq: Every day | ORAL | Status: DC

## 2012-12-07 NOTE — Assessment & Plan Note (Signed)
Mild to mod, for antibx course,  to f/u any worsening symptoms or concerns 

## 2012-12-07 NOTE — Assessment & Plan Note (Signed)
stable overall by history and exam, recent data reviewed with pt, and pt to continue medical treatment as before,  to f/u any worsening symptoms or concerns BP Readings from Last 3 Encounters:  12/07/12 120/80  11/16/12 128/74  09/23/12 126/70

## 2012-12-07 NOTE — Progress Notes (Signed)
Subjective:    Patient ID: Vickie Galvan, female    DOB: 1975-09-07, 37 y.o.   MRN: [id number]  HPI  Here with 2-3 days acute onset fever, facial pain, right ear pain and pressure, headache, general weakness and malaise, and greenish d/c, with mild ST and cough, but pt denies chest pain, wheezing, increased sob or doe, orthopnea, PND, increased LE swelling, palpitations, or syncope but has had some vertigo related to the right ear pressure. Pt denies new neurological symptoms such as new headache, or facial or extremity weakness or numbness ,  Pt denies polydipsia, polyuria, or low sugar symptoms such as weakness or confusion improved with po intake.  Pt states overall good compliance with meds, trying to follow lower cholesterol, diabetic diet, wt overall stable but little exercise however.   S/p BTL - not pregnant Past Medical History  Diagnosis Date  . Anxiety   . Environmental allergies   . ANEMIA, IRON DEFICIENCY   . GERD   . GOITER, MULTINODULAR   . HYPERTENSION   . CONSTIPATION    Past Surgical History  Procedure Laterality Date  . Tubal ligation      reports that she has never smoked. She does not have any smokeless tobacco history on file. She reports that  drinks alcohol. She reports that she does not use illicit drugs. family history includes Diabetes in her maternal grandfather; Hypertension in her maternal grandfather and maternal grandmother; and Stroke in her maternal grandfather. Allergies  Allergen Reactions  . Azithromycin Other (See Comments)    abd pain   Current Outpatient Prescriptions on File Prior to Visit  Medication Sig Dispense Refill  . ALPRAZolam (XANAX) 0.25 MG tablet Take 1 tablet (0.25 mg total) by mouth 2 (two) times daily as needed for anxiety.  60 tablet  0  . diphenoxylate-atropine (LOMOTIL) 2.5-0.025 MG per tablet Take 1 tablet by mouth 4 (four) times daily as needed for diarrhea or loose stools.  30 tablet  0  . fexofenadine (ALLEGRA) 180 MG  tablet Take 180 mg by mouth daily as needed.        . fluticasone (FLONASE) 50 MCG/ACT nasal spray Place 2 sprays into the nose daily as needed.        Marland Kitchen losartan-hydrochlorothiazide (HYZAAR) 100-12.5 MG per tablet TAKE 1 TABLET BY MOUTH DAILY.  90 tablet  2  . ondansetron (ZOFRAN-ODT) 4 MG disintegrating tablet Take 1 tablet (4 mg total) by mouth every 8 (eight) hours as needed for nausea.  20 tablet  0  . potassium chloride (KLOR-CON 10) 10 MEQ tablet Take 1 tablet (10 mEq total) by mouth daily.  90 tablet  3  . traMADol (ULTRAM) 50 MG tablet Take 1 tablet (50 mg total) by mouth every 8 (eight) hours as needed for pain.  30 tablet  0   No current facility-administered medications on file prior to visit.   Review of Systems  Constitutional: Negative for unexpected weight change, or unusual diaphoresis  HENT: Negative for tinnitus.   Eyes: Negative for photophobia and visual disturbance.  Respiratory: Negative for choking and stridor.   Gastrointestinal: Negative for vomiting and blood in stool.  Genitourinary: Negative for hematuria and decreased urine volume.  Musculoskeletal: Negative for acute joint swelling Skin: Negative for color change and wound.  Neurological: Negative for tremors and numbness other than noted  Psychiatric/Behavioral: Negative for decreased concentration or  hyperactivity.       Objective:   Physical Exam BP 120/80  Pulse 104  Temp(Src) 98.3 F (36.8 C) (Oral)  Ht 5\' 5"  (1.651 m)  Wt 150 lb 4 oz (68.153 kg)  BMI 25 kg/m2  SpO2 97% VS noted,  Constitutional: Pt appears well-developed and well-nourished.  HENT: Head: NCAT.  Right Ear: External ear normal.  Left Ear: External ear normal.  Bilat tm's with mild erythema.  Max sinus areas mild tender.  Pharynx with mild erythema, no exudate Eyes: Conjunctivae and EOM are normal. Pupils are equal, round, and reactive to light.  Neck: Normal range of motion. Neck supple.  Cardiovascular: Normal rate and  regular rhythm.   Pulmonary/Chest: Effort normal and breath sounds normal.  Abd:  Soft, NT, non-distended, + BS Neurological: Pt is alert. Not confused , motor intact 5/5., gait normal Skin: Skin is warm. No erythema.  Psychiatric: Pt behavior is normal. Thought content normal.     Assessment & Plan:

## 2012-12-07 NOTE — Assessment & Plan Note (Signed)
stable overall by history and exam, recent data reviewed with pt, and pt to continue medical treatment as before,  to f/u any worsening symptoms or concerns  

## 2012-12-07 NOTE — Patient Instructions (Addendum)
Please take all new medication as prescribed - the antibiotic, and the meclizine for dizziness Please continue all other medications as before, and refills have been done if requested. - the nasonex and antihistamine, and mucinex D if needed  Please return in 6 months, or sooner if needed, with Lab testing done 3-5 days before

## 2012-12-09 ENCOUNTER — Emergency Department (HOSPITAL_COMMUNITY)
Admission: EM | Admit: 2012-12-09 | Discharge: 2012-12-09 | Disposition: A | Discharge status: Home / Self Care | Payer: BC Managed Care – PPO | Attending: Emergency Medicine | Admitting: Emergency Medicine

## 2012-12-09 ENCOUNTER — Encounter (HOSPITAL_COMMUNITY): Payer: Self-pay | Admitting: *Deleted

## 2012-12-09 DIAGNOSIS — I10 Essential (primary) hypertension: Secondary | ICD-10-CM | POA: Insufficient documentation

## 2012-12-09 DIAGNOSIS — F411 Generalized anxiety disorder: Secondary | ICD-10-CM | POA: Insufficient documentation

## 2012-12-09 DIAGNOSIS — K219 Gastro-esophageal reflux disease without esophagitis: Secondary | ICD-10-CM | POA: Insufficient documentation

## 2012-12-09 DIAGNOSIS — Z79899 Other long term (current) drug therapy: Secondary | ICD-10-CM | POA: Insufficient documentation

## 2012-12-09 DIAGNOSIS — Z8639 Personal history of other endocrine, nutritional and metabolic disease: Secondary | ICD-10-CM | POA: Insufficient documentation

## 2012-12-09 DIAGNOSIS — Z862 Personal history of diseases of the blood and blood-forming organs and certain disorders involving the immune mechanism: Secondary | ICD-10-CM | POA: Insufficient documentation

## 2012-12-09 DIAGNOSIS — R42 Dizziness and giddiness: Secondary | ICD-10-CM | POA: Insufficient documentation

## 2012-12-09 DIAGNOSIS — F430 Acute stress reaction: Secondary | ICD-10-CM | POA: Insufficient documentation

## 2012-12-09 DIAGNOSIS — F419 Anxiety disorder, unspecified: Secondary | ICD-10-CM

## 2012-12-09 DIAGNOSIS — Z8719 Personal history of other diseases of the digestive system: Secondary | ICD-10-CM | POA: Insufficient documentation

## 2012-12-09 DIAGNOSIS — IMO0002 Reserved for concepts with insufficient information to code with codable children: Secondary | ICD-10-CM | POA: Insufficient documentation

## 2012-12-09 DIAGNOSIS — J329 Chronic sinusitis, unspecified: Secondary | ICD-10-CM | POA: Insufficient documentation

## 2012-12-09 LAB — BASIC METABOLIC PANEL
BUN: 7 mg/dL (ref 6–23)
Calcium: 8.9 mg/dL (ref 8.4–10.5)
Chloride: 101 mEq/L (ref 96–112)
Creatinine, Ser: 0.73 mg/dL (ref 0.50–1.10)
GFR calc Af Amer: >90 mL/min (ref >90)
GFR calc non Af Amer: >90 mL/min (ref >90)

## 2012-12-09 LAB — TROPONIN I: Troponin I: <0.3 ng/mL (ref <0.30)

## 2012-12-09 LAB — CBC WITH DIFFERENTIAL/PLATELET
Basophils Absolute: 0.1 10*3/uL (ref 0.0–0.1)
Basophils Relative: 2 % — ABNORMAL HIGH (ref 0–1)
Eosinophils Absolute: 0.1 10*3/uL (ref 0.0–0.7)
Eosinophils Relative: 3 % (ref 0–5)
HCT: 34.4 % — ABNORMAL LOW (ref 36.0–46.0)
Hemoglobin: 11.6 g/dL — ABNORMAL LOW (ref 12.0–15.0)
MCH: 31.2 pg (ref 26.0–34.0)
MCHC: 33.7 g/dL (ref 30.0–36.0)
MCV: 92.5 fL (ref 78.0–100.0)
Monocytes Absolute: 0.3 10*3/uL (ref 0.1–1.0)
Monocytes Relative: 7 % (ref 3–12)
RDW: 14.6 % (ref 11.5–15.5)

## 2012-12-09 NOTE — ED Notes (Signed)
Pt discharged, but would like to see doctor, sts has questions about her medications.

## 2012-12-09 NOTE — ED Notes (Signed)
Patient in blue scrubs and red socks. -- Patient and 1 bag of belongings located behind nurses station across from patient's room.

## 2012-12-09 NOTE — ED Provider Notes (Signed)
History     CSN: [id number]  Arrival date & time 12/09/12  1111   First MD Initiated Contact with Patient 12/09/12 1122      No chief complaint on file.   (Consider location/radiation/quality/duration/timing/severity/associated sxs/prior treatment) HPI Comments: Patient presents with complaints of stress, sinus problems, dizziness, and elevated blood pressure.  She reports she has been anxious about her son graduating high school.  She was seen by her pcp two days ago and given meclizine and levaquin, but started feeling worse today.  No fevers or chills.  Worse with stress, no alleviating factors.    The history is provided by the patient.    Past Medical History  Diagnosis Date  . Anxiety   . Environmental allergies   . ANEMIA, IRON DEFICIENCY   . GERD   . GOITER, MULTINODULAR   . HYPERTENSION   . CONSTIPATION     Past Surgical History  Procedure Laterality Date  . Tubal ligation      Family History  Problem Relation Age of Onset  . Hypertension Maternal Grandmother   . Hypertension Maternal Grandfather   . Diabetes Maternal Grandfather   . Stroke Maternal Grandfather     History  Substance Use Topics  . Smoking status: Never Smoker   . Smokeless tobacco: Not on file  . Alcohol Use: Yes     Comment: occasional    OB History   Grav Para Term Preterm Abortions TAB SAB Ect Mult Living                  Review of Systems  All other systems reviewed and are negative.    Allergies  Azithromycin  Home Medications   Current Outpatient Rx  Name  Route  Sig  Dispense  Refill  . ALPRAZolam (XANAX) 0.25 MG tablet   Oral   Take 1 tablet (0.25 mg total) by mouth 2 (two) times daily as needed for anxiety.   60 tablet   0   . diphenoxylate-atropine (LOMOTIL) 2.5-0.025 MG per tablet   Oral   Take 1 tablet by mouth 4 (four) times daily as needed for diarrhea or loose stools.   30 tablet   0   . fexofenadine (ALLEGRA) 180 MG tablet   Oral   Take 180 mg  by mouth daily as needed.           . fluticasone (FLONASE) 50 MCG/ACT nasal spray   Nasal   Place 2 sprays into the nose daily as needed.           Marland Kitchen levofloxacin (LEVAQUIN) 250 MG tablet   Oral   Take 1 tablet (250 mg total) by mouth daily.   10 tablet   0   . losartan-hydrochlorothiazide (HYZAAR) 100-12.5 MG per tablet      TAKE 1 TABLET BY MOUTH DAILY.   90 tablet   2   . meclizine (ANTIVERT) 12.5 MG tablet   Oral   Take 1 tablet (12.5 mg total) by mouth 3 (three) times daily as needed.   30 tablet   1   . ondansetron (ZOFRAN-ODT) 4 MG disintegrating tablet   Oral   Take 1 tablet (4 mg total) by mouth every 8 (eight) hours as needed for nausea.   20 tablet   0   . potassium chloride (KLOR-CON 10) 10 MEQ tablet   Oral   Take 1 tablet (10 mEq total) by mouth daily.   90 tablet   3   . traMADol (  ULTRAM) 50 MG tablet   Oral   Take 1 tablet (50 mg total) by mouth every 8 (eight) hours as needed for pain.   30 tablet   0     BP 189/142  Pulse 107  Temp(Src) 98.4 F (36.9 C) (Oral)  Resp 20  SpO2 100%  Physical Exam  Nursing note and vitals reviewed. Constitutional: She is oriented to person, place, and time. She appears well-developed and well-nourished. No distress.  HENT:  Head: Normocephalic and atraumatic.  Mouth/Throat: Oropharynx is clear and moist.  There is maxillofacial ttp.    Eyes: EOM are normal. Pupils are equal, round, and reactive to light.  Neck: Normal range of motion. Neck supple.  Cardiovascular: Normal rate, regular rhythm and normal heart sounds.  Exam reveals no gallop and no friction rub.   No murmur heard. Pulmonary/Chest: Effort normal and breath sounds normal. No respiratory distress. She has no wheezes.  Abdominal: Soft. Bowel sounds are normal. She exhibits no distension. There is no tenderness.  Musculoskeletal: Normal range of motion. She exhibits no edema.  Neurological: She is alert and oriented to person, place, and  time. No cranial nerve deficit. She exhibits normal muscle tone. Coordination normal.  Skin: Skin is warm and dry. She is not diaphoretic.    ED Course  Procedures (including critical care time)  Labs Reviewed  CBC WITH DIFFERENTIAL  BASIC METABOLIC PANEL  TROPONIN I   No results found.   No diagnosis found.   Date: 12/09/2012  Rate: 104  Rhythm: sinus tachycardia with pvcs  QRS Axis: normal  Intervals: normal  ST/T Wave abnormalities: normal  Conduction Disutrbances:none  Narrative Interpretation:   Old EKG Reviewed: none available    MDM  The ekg and labs are unremarkable.  I suspect there is an element of sinusitis, anxiety to this visit, but her bp is also elevated.  It has started to improve now that she is feeling better and I believe is stable for discharge.  To follow up with pcp prn.        Geoffery Lyons, MD 12/09/12 1324

## 2012-12-09 NOTE — ED Notes (Signed)
Pt presents to ed with c/o anxiety and sinus infections; pt sts her son graduated this week and she felt overly excited; pt reports taking mucinex d and b/p meds as well as potassium; pt also sts was diagnosed with vertigo and she feels like she is worrying too much about feeling dizzy as well as maybe taking too much medications. Pt seems very anxious just describing how she feels.

## 2012-12-29 ENCOUNTER — Telehealth: Payer: Self-pay

## 2012-12-29 DIAGNOSIS — H9209 Otalgia, unspecified ear: Secondary | ICD-10-CM

## 2012-12-29 NOTE — Telephone Encounter (Signed)
Notes were sent with referral per [facility]

## 2012-12-29 NOTE — Telephone Encounter (Signed)
Patient needs a referral asap to Dr. Ezzard Standing ENT as she has scheduled an appointment today at 2:45.  They need notes as well.  thanks

## 2012-12-29 NOTE — Telephone Encounter (Signed)
Ok for robin to send most recent office visits

## 2013-03-09 ENCOUNTER — Other Ambulatory Visit: Payer: Self-pay

## 2013-03-09 ENCOUNTER — Ambulatory Visit: Payer: BC Managed Care – PPO | Admitting: Endocrinology

## 2013-03-09 DIAGNOSIS — F419 Anxiety disorder, unspecified: Secondary | ICD-10-CM

## 2013-03-09 MED ORDER — ALPRAZOLAM 0.25 MG PO TABS: 0.2500 mg | ORAL_TABLET | Freq: Two times a day (BID) | ORAL | Status: DC | PRN

## 2013-03-09 NOTE — Telephone Encounter (Signed)
Faxed hardcopy to [facility] [address] [place] 

## 2013-03-09 NOTE — Telephone Encounter (Signed)
Done hardcopy to [facility]bin  

## 2013-03-10 ENCOUNTER — Ambulatory Visit: Payer: BC Managed Care – PPO | Admitting: Endocrinology

## 2013-03-14 ENCOUNTER — Ambulatory Visit: Payer: BC Managed Care – PPO | Admitting: Endocrinology

## 2013-03-22 ENCOUNTER — Ambulatory Visit: Payer: BC Managed Care – PPO | Admitting: Endocrinology

## 2013-03-24 ENCOUNTER — Ambulatory Visit: Payer: BC Managed Care – PPO | Admitting: Endocrinology

## 2013-03-24 DIAGNOSIS — Z0289 Encounter for other administrative examinations: Secondary | ICD-10-CM

## 2013-03-31 ENCOUNTER — Telehealth: Payer: Self-pay

## 2013-03-31 NOTE — Telephone Encounter (Signed)
The patient was informed MD would not complete her FMLA paperwork as MD had never seen her for Anxiety.  The patient informed that her PCP was the only  MD who prescribed her xanax.  At the time of prescribing she did inform the MD of anxiety problems.   She stated MD did ask her  if she wanted to be referred to psy. But she declined at that time. Patient does not want a referral at this time as PCP is the only MD she has discussed issue with and felt he would be able to complete FMLA.  Please advise.

## 2013-03-31 NOTE — Telephone Encounter (Signed)
I do not feel comfortabel with FMLA for anxiety.  This would need to be assessed by psychology or psychiatry

## 2013-04-04 NOTE — Telephone Encounter (Signed)
Patient informed. 

## 2013-04-05 ENCOUNTER — Other Ambulatory Visit: Payer: Self-pay | Admitting: Internal Medicine

## 2013-04-05 DIAGNOSIS — F410 Panic disorder [episodic paroxysmal anxiety] without agoraphobia: Secondary | ICD-10-CM

## 2013-04-05 DIAGNOSIS — F411 Generalized anxiety disorder: Secondary | ICD-10-CM

## 2013-04-14 ENCOUNTER — Encounter: Payer: Self-pay | Admitting: Endocrinology

## 2013-04-14 ENCOUNTER — Ambulatory Visit (INDEPENDENT_AMBULATORY_CARE_PROVIDER_SITE_OTHER): Payer: BC Managed Care – PPO | Admitting: Endocrinology

## 2013-04-14 VITALS — BP 122/78 | HR 80 | Ht 65.0 in | Wt 150.0 lb

## 2013-04-14 DIAGNOSIS — E042 Nontoxic multinodular goiter: Secondary | ICD-10-CM

## 2013-04-14 NOTE — Progress Notes (Signed)
  Subjective:    Patient ID: Vickie Galvan, female    DOB: 1976-03-20, 37 y.o.   MRN: [id number]  HPI Pt returns for f/u of multinodular goiter (dx'ed 2010--bx was benign then).  she developed a suppressed TSH in late 2013, and was rx'ed with i-131 in February of 2014.  Since then, pt states she feels no different, and well in general.   Past Medical History  Diagnosis Date  . Anxiety   . Environmental allergies   . ANEMIA, IRON DEFICIENCY   . GERD   . GOITER, MULTINODULAR   . HYPERTENSION   . CONSTIPATION     Past Surgical History  Procedure Laterality Date  . Tubal ligation      History   Social History  . Marital Status: Married    Spouse Name: N/A    Number of Children: N/A  . Years of Education: N/A   Occupational History  . Not on file.   Social History Main Topics  . Smoking status: Never Smoker   . Smokeless tobacco: Not on file  . Alcohol Use: Yes     Comment: occasional  . Drug Use: No  . Sexual Activity: Not on file   Other Topics Concern  . Not on file   Social History Narrative  . No narrative on file    Current Outpatient Prescriptions on File Prior to Visit  Medication Sig Dispense Refill  . ALPRAZolam (XANAX) 0.25 MG tablet Take 1 tablet (0.25 mg total) by mouth 2 (two) times daily as needed for anxiety.  60 tablet  1  . fexofenadine (ALLEGRA) 180 MG tablet Take 180 mg by mouth daily as needed.        Marland Kitchen levofloxacin (LEVAQUIN) 250 MG tablet Take 1 tablet (250 mg total) by mouth daily.  10 tablet  0  . losartan-hydrochlorothiazide (HYZAAR) 100-12.5 MG per tablet TAKE 1 TABLET BY MOUTH DAILY.  90 tablet  2  . meclizine (ANTIVERT) 12.5 MG tablet Take 1 tablet (12.5 mg total) by mouth 3 (three) times daily as needed.  30 tablet  1  . mometasone (NASONEX) 50 MCG/ACT nasal spray Place 2 sprays into the nose 2 (two) times daily.      . potassium chloride (KLOR-CON 10) 10 MEQ tablet Take 1 tablet (10 mEq total) by mouth daily.  90 tablet  3  .  pseudoephedrine-guaifenesin (MUCINEX D) 60-600 MG per tablet Take 1 tablet by mouth every 12 (twelve) hours.       No current facility-administered medications on file prior to visit.   Allergies  Allergen Reactions  . Azithromycin Other (See Comments)    abd pain   Family History  Problem Relation Age of Onset  . Hypertension Maternal Grandmother   . Hypertension Maternal Grandfather   . Diabetes Maternal Grandfather   . Stroke Maternal Grandfather    BP 122/78  Pulse 80  Ht 5\' 5"  (1.651 m)  Wt 150 lb (68.04 kg)  BMI 24.96 kg/m2  SpO2 98%  Review of Systems Denies weight change.      Objective:   Physical Exam VITAL SIGNS:  See vs page GENERAL: no distress NECK: thyroid is 10x normal size, with multinodular surface.      Assessment & Plan:  Multinodular goiter, clinically unchanged Hyperthyroidism, due to the goiter, s/p i-131 rx

## 2013-04-14 NOTE — Patient Instructions (Addendum)
blood tests are being requested for you today.  We'll contact you with results.   Please come back for a follow-up appointment in 3 months.    

## 2013-04-20 ENCOUNTER — Other Ambulatory Visit (INDEPENDENT_AMBULATORY_CARE_PROVIDER_SITE_OTHER): Payer: BC Managed Care – PPO

## 2013-04-20 DIAGNOSIS — E042 Nontoxic multinodular goiter: Secondary | ICD-10-CM

## 2013-05-05 ENCOUNTER — Telehealth: Payer: Self-pay | Admitting: Internal Medicine

## 2013-05-05 NOTE — Telephone Encounter (Signed)
Err

## 2013-06-01 ENCOUNTER — Other Ambulatory Visit: Payer: Self-pay | Admitting: Internal Medicine

## 2013-06-01 MED ORDER — LOSARTAN POTASSIUM-HCTZ 100-12.5 MG PO TABS: 1.0000 | ORAL_TABLET | Freq: Every day | ORAL | Status: DC

## 2013-08-01 ENCOUNTER — Telehealth: Payer: Self-pay

## 2013-08-01 ENCOUNTER — Encounter: Payer: Self-pay | Admitting: Endocrinology

## 2013-08-01 ENCOUNTER — Other Ambulatory Visit (INDEPENDENT_AMBULATORY_CARE_PROVIDER_SITE_OTHER): Payer: BC Managed Care – PPO

## 2013-08-01 ENCOUNTER — Ambulatory Visit (INDEPENDENT_AMBULATORY_CARE_PROVIDER_SITE_OTHER): Payer: BC Managed Care – PPO | Admitting: Endocrinology

## 2013-08-01 VITALS — BP 128/90 | HR 74 | Temp 98.2°F | Ht 65.0 in | Wt 157.0 lb

## 2013-08-01 DIAGNOSIS — Z0000 Encounter for general adult medical examination without abnormal findings: Secondary | ICD-10-CM

## 2013-08-01 DIAGNOSIS — E042 Nontoxic multinodular goiter: Secondary | ICD-10-CM

## 2013-08-01 LAB — CBC WITH DIFFERENTIAL/PLATELET
Basophils Absolute: 0.1 10*3/uL (ref 0.0–0.1)
Basophils Relative: 0.8 % (ref 0.0–3.0)
EOS PCT: 1.9 % (ref 0.0–5.0)
Eosinophils Absolute: 0.1 10*3/uL (ref 0.0–0.7)
HEMATOCRIT: 34.8 % — AB (ref 36.0–46.0)
HEMOGLOBIN: 11.7 g/dL — AB (ref 12.0–15.0)
LYMPHS ABS: 2.2 10*3/uL (ref 0.7–4.0)
Lymphocytes Relative: 34 % (ref 12.0–46.0)
MCHC: 33.5 g/dL (ref 30.0–36.0)
MCV: 98.5 fl (ref 78.0–100.0)
MONOS PCT: 8 % (ref 3.0–12.0)
Monocytes Absolute: 0.5 10*3/uL (ref 0.1–1.0)
Neutro Abs: 3.6 10*3/uL (ref 1.4–7.7)
Neutrophils Relative %: 55.3 % (ref 43.0–77.0)
PLATELETS: 311 10*3/uL (ref 150.0–400.0)
RBC: 3.53 Mil/uL — ABNORMAL LOW (ref 3.87–5.11)
RDW: 13.6 % (ref 11.5–14.6)
WBC: 6.5 10*3/uL (ref 4.5–10.5)

## 2013-08-01 LAB — HEPATIC FUNCTION PANEL
ALBUMIN: 3.9 g/dL (ref 3.5–5.2)
ALK PHOS: 46 U/L (ref 39–117)
ALT: 14 U/L (ref 0–35)
AST: 16 U/L (ref 0–37)
Bilirubin, Direct: 0 mg/dL (ref 0.0–0.3)
TOTAL PROTEIN: 7.8 g/dL (ref 6.0–8.3)
Total Bilirubin: 0.5 mg/dL (ref 0.3–1.2)

## 2013-08-01 LAB — URINALYSIS, ROUTINE W REFLEX MICROSCOPIC
BILIRUBIN URINE: NEGATIVE
KETONES UR: NEGATIVE
Leukocytes, UA: NEGATIVE
Nitrite: NEGATIVE
Specific Gravity, Urine: 1.025 (ref 1.000–1.030)
Total Protein, Urine: NEGATIVE
URINE GLUCOSE: NEGATIVE
UROBILINOGEN UA: 0.2 (ref 0.0–1.0)
WBC, UA: NONE SEEN (ref 0–?)
pH: 5.5 (ref 5.0–8.0)

## 2013-08-01 LAB — BASIC METABOLIC PANEL
BUN: 14 mg/dL (ref 6–23)
CALCIUM: 9.2 mg/dL (ref 8.4–10.5)
CHLORIDE: 103 meq/L (ref 96–112)
CO2: 25 meq/L (ref 19–32)
Creatinine, Ser: 0.8 mg/dL (ref 0.4–1.2)
GFR: 101.86 mL/min (ref >60.00)
GLUCOSE: 106 mg/dL — AB (ref 70–99)
Potassium: 3.3 mEq/L — ABNORMAL LOW (ref 3.5–5.1)
SODIUM: 137 meq/L (ref 135–145)

## 2013-08-01 LAB — T3, FREE: T3, Free: 2.8 pg/mL (ref 2.3–4.2)

## 2013-08-01 LAB — T4, FREE: Free T4: 0.67 ng/dL (ref 0.60–1.60)

## 2013-08-01 LAB — LIPID PANEL
CHOL/HDL RATIO: 3
CHOLESTEROL: 184 mg/dL (ref 0–200)
HDL: 54 mg/dL (ref >39.00)
LDL CALC: 113 mg/dL — AB (ref 0–99)
TRIGLYCERIDES: 87 mg/dL (ref 0.0–149.0)
VLDL: 17.4 mg/dL (ref 0.0–40.0)

## 2013-08-01 LAB — TSH: TSH: 3.49 u[IU]/mL (ref 0.35–5.50)

## 2013-08-01 NOTE — Telephone Encounter (Signed)
Labs entered.

## 2013-08-01 NOTE — Patient Instructions (Addendum)
blood tests are being requested for you today.  We'll contact you with results.  Please come back for a follow-up appointment in 6 months.  Let's recheck the ultrasound.  you will receive a phone call, about a day and time for an appointment. most of the time, a "lumpy thyroid" will eventually become overactive again.  this is usually a slow process, happening over the span of many years.

## 2013-08-01 NOTE — Progress Notes (Signed)
   Subjective:    Patient ID: Vickie Galvan, female    DOB: 1976/02/12, 38 y.o.   MRN: [id number]  HPI Pt returns for f/u of multinodular goiter (dx'ed 2010--bx was benign then).  she developed a suppressed TSH in late 2013, and was rx'ed with i-131 in February of 2014.  Since then, pt states she feels no different, and well in general.  She has slight swelling at the anterior neck, but no assoc pain. Past Medical History  Diagnosis Date  . Anxiety   . Environmental allergies   . ANEMIA, IRON DEFICIENCY   . GERD   . GOITER, MULTINODULAR   . HYPERTENSION   . CONSTIPATION     Past Surgical History  Procedure Laterality Date  . Tubal ligation      History   Social History  . Marital Status: Married    Spouse Name: N/A    Number of Children: N/A  . Years of Education: N/A   Occupational History  . Not on file.   Social History Main Topics  . Smoking status: Never Smoker   . Smokeless tobacco: Not on file  . Alcohol Use: Yes     Comment: occasional  . Drug Use: No  . Sexual Activity: Not on file   Other Topics Concern  . Not on file   Social History Narrative  . No narrative on file    Current Outpatient Prescriptions on File Prior to Visit  Medication Sig Dispense Refill  . ALPRAZolam (XANAX) 0.25 MG tablet Take 1 tablet (0.25 mg total) by mouth 2 (two) times daily as needed for anxiety.  60 tablet  1  . fexofenadine (ALLEGRA) 180 MG tablet Take 180 mg by mouth daily as needed.        Marland Kitchen KLOR-CON 10 10 MEQ tablet TAKE 1 TABLET (10 MEQ TOTAL) BY MOUTH DAILY.  30 tablet  11  . losartan-hydrochlorothiazide (HYZAAR) 100-12.5 MG per tablet Take 1 tablet by mouth daily.  30 tablet  11  . meclizine (ANTIVERT) 12.5 MG tablet Take 1 tablet (12.5 mg total) by mouth 3 (three) times daily as needed.  30 tablet  1  . mometasone (NASONEX) 50 MCG/ACT nasal spray Place 2 sprays into the nose 2 (two) times daily.      . pseudoephedrine-guaifenesin (MUCINEX D) 60-600 MG per tablet  Take 1 tablet by mouth every 12 (twelve) hours.       No current facility-administered medications on file prior to visit.    Allergies  Allergen Reactions  . Azithromycin Other (See Comments)    abd pain    Family History  Problem Relation Age of Onset  . Hypertension Maternal Grandmother   . Hypertension Maternal Grandfather   . Diabetes Maternal Grandfather   . Stroke Maternal Grandfather     BP 128/90  Pulse 74  Temp(Src) 98.2 F (36.8 C) (Oral)  Ht 5\' 5"  (1.651 m)  Wt 157 lb (71.215 kg)  BMI 26.13 kg/m2  SpO2 99%  Review of Systems Denies weight change and tremor.      Objective:   Physical Exam VITAL SIGNS:  See vs page GENERAL: no distress NECK: thyroid is 5x normal size, with multinodular surface.  Lab Results  Component Value Date   TSH 3.49 08/01/2013      Assessment & Plan:  Hyperthyroidism, resolved with i-131 rx Multinodular goiter, usually hereditary.  She is due for recheck.

## 2013-08-28 ENCOUNTER — Ambulatory Visit
Admission: RE | Admit: 2013-08-28 | Discharge: 2013-08-28 | Disposition: A | Discharge status: Home / Self Care | Payer: BC Managed Care – PPO | Source: Ambulatory Visit | Attending: Endocrinology | Admitting: Endocrinology

## 2013-08-28 DIAGNOSIS — E042 Nontoxic multinodular goiter: Secondary | ICD-10-CM

## 2013-09-05 ENCOUNTER — Other Ambulatory Visit: Payer: Self-pay

## 2013-12-08 ENCOUNTER — Telehealth: Payer: Self-pay

## 2013-12-08 NOTE — Telephone Encounter (Signed)
[place] for OV with me, or even sat clinic, as she may need referral to gen surgury (GI would not be helpful for this kind of hernia treatment)

## 2013-12-08 NOTE — Telephone Encounter (Signed)
The patient has an umbilical hernia giving her problems, should she schedule appointment with Primary care physician of GI?

## 2013-12-08 NOTE — Telephone Encounter (Signed)
Patient informed of information and scheduled appointment with PCP

## 2013-12-13 ENCOUNTER — Ambulatory Visit: Payer: BC Managed Care – PPO | Admitting: Internal Medicine

## 2013-12-13 DIAGNOSIS — Z0289 Encounter for other administrative examinations: Secondary | ICD-10-CM

## 2013-12-14 ENCOUNTER — Telehealth: Payer: Self-pay | Admitting: Internal Medicine

## 2013-12-14 NOTE — Telephone Encounter (Signed)
Patient stated she had an unexpected emergency yesterday.   She states that she does want to reschedule but that it would be later on b/c she was uncertain of her summer schedule with her kids.

## 2013-12-14 NOTE — Telephone Encounter (Signed)
Ok to offer all no show patients repeat appt  If pt cont's to no show, they will unfort need to be let go from the practice

## 2013-12-14 NOTE — Telephone Encounter (Signed)
Patient no showed for acute for an hernia.  Please advise.  Thanks!

## 2013-12-28 ENCOUNTER — Ambulatory Visit (INDEPENDENT_AMBULATORY_CARE_PROVIDER_SITE_OTHER): Payer: BC Managed Care – PPO | Admitting: Internal Medicine

## 2013-12-28 ENCOUNTER — Encounter: Payer: Self-pay | Admitting: Internal Medicine

## 2013-12-28 VITALS — BP 120/70 | HR 100 | Temp 98.2°F | Wt 161.1 lb

## 2013-12-28 DIAGNOSIS — F419 Anxiety disorder, unspecified: Secondary | ICD-10-CM

## 2013-12-28 DIAGNOSIS — F411 Generalized anxiety disorder: Secondary | ICD-10-CM

## 2013-12-28 DIAGNOSIS — D509 Iron deficiency anemia, unspecified: Secondary | ICD-10-CM

## 2013-12-28 DIAGNOSIS — K429 Umbilical hernia without obstruction or gangrene: Secondary | ICD-10-CM

## 2013-12-28 DIAGNOSIS — I10 Essential (primary) hypertension: Secondary | ICD-10-CM

## 2013-12-28 MED ORDER — MOMETASONE FUROATE 50 MCG/ACT NA SUSP: 2.0000 | Freq: Two times a day (BID) | NASAL | Status: DC

## 2013-12-28 MED ORDER — FEXOFENADINE HCL 180 MG PO TABS: 180.0000 mg | ORAL_TABLET | Freq: Every day | ORAL | Status: DC | PRN

## 2013-12-28 MED ORDER — ALPRAZOLAM 0.25 MG PO TABS: 0.2500 mg | ORAL_TABLET | Freq: Two times a day (BID) | ORAL | Status: DC | PRN

## 2013-12-28 NOTE — Progress Notes (Signed)
Pre visit review using our clinic review tool, if applicable. No additional management support is needed unless otherwise documented below in the visit note. 

## 2013-12-28 NOTE — Progress Notes (Signed)
   Subjective:    Patient ID: Vickie Galvan, female    DOB: 29-May-1976, 38 y.o.   MRN: [id number]  HPI Here to f/u, c/o swollen area at umbilicus after recent increased lifting with increased intermittent mild pain, without n/v, bowel change or blood.  Pt denies chest pain, increased sob or doe, wheezing, orthopnea, PND, increased LE swelling, palpitations, dizziness or syncope.  Pt denies new neurological symptoms such as new headache, or facial or extremity weakness or numbness  .  Has been taking iron po, no overt bleeding but still occas heavy menses.  Denies worsening depressive symptoms, suicidal ideation, or panic; needs med refill. Past Medical History  Diagnosis Date  . Anxiety   . Environmental allergies   . ANEMIA, IRON DEFICIENCY   . GERD   . GOITER, MULTINODULAR   . HYPERTENSION   . CONSTIPATION    Past Surgical History  Procedure Laterality Date  . Tubal ligation      reports that she has never smoked. She does not have any smokeless tobacco history on file. She reports that she drinks alcohol. She reports that she does not use illicit drugs. family history includes Diabetes in her maternal grandfather; Hypertension in her maternal grandfather and maternal grandmother; Stroke in her maternal grandfather. Allergies  Allergen Reactions  . Azithromycin Other (See Comments)    abd pain   Current Outpatient Prescriptions on File Prior to Visit  Medication Sig Dispense Refill  . KLOR-CON 10 10 MEQ tablet TAKE 1 TABLET (10 MEQ TOTAL) BY MOUTH DAILY.  30 tablet  11  . losartan-hydrochlorothiazide (HYZAAR) 100-12.5 MG per tablet Take 1 tablet by mouth daily.  30 tablet  11  . pseudoephedrine-guaifenesin (MUCINEX D) 60-600 MG per tablet Take 1 tablet by mouth every 12 (twelve) hours.       No current facility-administered medications on file prior to visit.   Review of Systems  Constitutional: Negative for unusual diaphoresis or other sweats  HENT: Negative for ringing in  ear Eyes: Negative for double vision or worsening visual disturbance.  Respiratory: Negative for choking and stridor.   Gastrointestinal: Negative for vomiting or other signifcant bowel change Genitourinary: Negative for hematuria or decreased urine volume.  Musculoskeletal: Negative for other MSK pain or swelling Skin: Negative for color change and worsening wound.  Neurological: Negative for tremors and numbness other than noted  Psychiatric/Behavioral: Negative for decreased concentration or agitation other than above       Objective:   Physical Exam BP 120/70  Pulse 100  Temp(Src) 98.2 F (36.8 C) (Oral)  Wt 161 lb 2 oz (73.086 kg)  SpO2 98% VS noted,  Constitutional: Pt appears well-developed, well-nourished.  HENT: Head: NCAT.  Right Ear: External ear normal.  Left Ear: External ear normal.  Eyes: . Pupils are equal, round, and reactive to light. Conjunctivae and EOM are normal Neck: Normal range of motion. Neck supple.  Cardiovascular: Normal rate and regular rhythm.   Pulmonary/Chest: Effort normal and breath sounds normal.  Abd:  Soft, NT, ND, + BS, with very small left aspect umbilical hernia, tender, reducible, Neurological: Pt is alert. Not confused , motor grossly intact Skin: Skin is warm. No rash Psychiatric: Pt behavior is normal. No agitation. mild nervous     Assessment & Plan:   BP Readings from Last 3 Encounters:  12/28/13 120/70  08/01/13 128/90  04/14/13 122/78

## 2013-12-28 NOTE — Patient Instructions (Addendum)
Please continue all other medications as before, and refills have been done if requested - the xanax  Please have the pharmacy call with any other refills you may need.  Please keep your appointments with your specialists as you may have planned  You will be contacted regarding the referral for: General Surgury  OK to take the xanax the next dental appt, or the triazolam for sedation that they offered (but not both)  Please return in 1 year for your yearly visit, or sooner if needed

## 2013-12-31 NOTE — Assessment & Plan Note (Signed)
stable overall by history and exam, recent data reviewed with pt, and pt to continue medical treatment as before,  to f/u any worsening symptoms or concerns Lab Results  Component Value Date   WBC 6.5 08/01/2013   HGB 11.7* 08/01/2013   HCT 34.8* 08/01/2013   PLT 311.0 08/01/2013   GLUCOSE 106* 08/01/2013   CHOL 184 08/01/2013   TRIG 87.0 08/01/2013   HDL 54.00 08/01/2013   LDLDIRECT 124.5 05/24/2012   LDLCALC 113* 08/01/2013   ALT 14 08/01/2013   AST 16 08/01/2013   NA 137 08/01/2013   K 3.3* 08/01/2013   CL 103 08/01/2013   CREATININE 0.8 08/01/2013   BUN 14 08/01/2013   CO2 25 08/01/2013   TSH 3.49 08/01/2013   For xanax refill,  to f/u any worsening symptoms or concerns, decines need for counseling

## 2013-12-31 NOTE — Assessment & Plan Note (Signed)
Lab Results  Component Value Date   WBC 6.5 08/01/2013   HGB 11.7* 08/01/2013   HCT 34.8* 08/01/2013   MCV 98.5 08/01/2013   PLT 311.0 08/01/2013   D/w pt, cont oral iron, declines further lab,  to f/u any worsening symptoms or concerns

## 2013-12-31 NOTE — Assessment & Plan Note (Signed)
stable overall by history and exam, recent data reviewed with pt, and pt to continue medical treatment as before,  to f/u any worsening symptoms or concerns BP Readings from Last 3 Encounters:  12/28/13 120/70  08/01/13 128/90  04/14/13 122/78

## 2013-12-31 NOTE — Assessment & Plan Note (Signed)
Small, tender, reducible, for gen surgury eval

## 2014-01-04 ENCOUNTER — Telehealth: Payer: Self-pay

## 2014-01-04 NOTE — Telephone Encounter (Signed)
The patient was informed by her PCP Dr. Jenny Reichmann that liquid iron is available as a prescription, but he stated was expensive.  She would like a prescription sent in to her pharmacy to at least try, states pills upset her stomach.  [facility] is [facility] [address]

## 2014-01-05 NOTE — Telephone Encounter (Signed)
Called the patient left a detailed message of MD instructions.

## 2014-01-05 NOTE — Telephone Encounter (Signed)
Anemia is mild: try chewable iron for kids Thx

## 2014-01-15 ENCOUNTER — Encounter (INDEPENDENT_AMBULATORY_CARE_PROVIDER_SITE_OTHER): Payer: Self-pay | Admitting: General Surgery

## 2014-01-15 ENCOUNTER — Ambulatory Visit (INDEPENDENT_AMBULATORY_CARE_PROVIDER_SITE_OTHER): Payer: BC Managed Care – PPO | Admitting: General Surgery

## 2014-01-15 VITALS — BP 124/76 | HR 75 | Temp 98.0°F | Ht 66.0 in | Wt 160.0 lb

## 2014-01-15 DIAGNOSIS — K420 Umbilical hernia with obstruction, without gangrene: Secondary | ICD-10-CM

## 2014-01-15 NOTE — Progress Notes (Signed)
Patient ID: Vickie Galvan, female   DOB: 1976/04/07, 38 y.o.   MRN: [id number]  Chief Complaint  Patient presents with  . Umbilical Hernia    HPI Vickie Galvan is a 38 y.o. female.  She is referred by Dr. Cathlean Cower for a symptomatic umbilical hernia.  This patient has noticed a bulge at her umbilicus for sternal weeks. Bulges sometimes. A sore sometimes. No nausea vomiting or change in her bowel habits. No prior history of hernia  Past history is significant for cesarean section x2 through pfannensteil incision. Multinodular goiter. Hypertension. Anxiety. Allergies. GERD.  HPI  Past Medical History  Diagnosis Date  . Anxiety   . Environmental allergies   . ANEMIA, IRON DEFICIENCY   . GERD   . GOITER, MULTINODULAR   . HYPERTENSION   . CONSTIPATION     Past Surgical History  Procedure Laterality Date  . Tubal ligation      Family History  Problem Relation Age of Onset  . Hypertension Maternal Grandmother   . Hypertension Maternal Grandfather   . Diabetes Maternal Grandfather   . Stroke Maternal Grandfather     Social History History  Substance Use Topics  . Smoking status: Never Smoker   . Smokeless tobacco: Not on file  . Alcohol Use: Yes     Comment: occasional    Allergies  Allergen Reactions  . Azithromycin Other (See Comments)    abd pain    Current Outpatient Prescriptions  Medication Sig Dispense Refill  . ALPRAZolam (XANAX) 0.25 MG tablet Take 1 tablet (0.25 mg total) by mouth 2 (two) times daily as needed for anxiety.  60 tablet  1  . KLOR-CON 10 10 MEQ tablet TAKE 1 TABLET (10 MEQ TOTAL) BY MOUTH DAILY.  30 tablet  11  . losartan-hydrochlorothiazide (HYZAAR) 100-12.5 MG per tablet Take 1 tablet by mouth daily.  30 tablet  11  . mometasone (NASONEX) 50 MCG/ACT nasal spray Place 2 sprays into the nose 2 (two) times daily.  17 g  11  . pseudoephedrine-guaifenesin (MUCINEX D) 60-600 MG per tablet Take 1 tablet by mouth every 12 (twelve) hours.        No current facility-administered medications for this visit.    Review of Systems Review of Systems  Constitutional: Negative for fever, chills and unexpected weight change.  HENT: Negative for congestion, hearing loss, sore throat, trouble swallowing and voice change.   Eyes: Negative for visual disturbance.  Respiratory: Negative for cough and wheezing.   Cardiovascular: Negative for chest pain, palpitations and leg swelling.  Gastrointestinal: Positive for abdominal pain. Negative for nausea, vomiting, diarrhea, constipation, blood in stool, abdominal distention and anal bleeding.  Genitourinary: Negative for hematuria, vaginal bleeding and difficulty urinating.  Musculoskeletal: Negative for arthralgias.  Skin: Negative for rash and wound.  Neurological: Negative for seizures, syncope and headaches.  Hematological: Negative for adenopathy. Does not bruise/bleed easily.  Psychiatric/Behavioral: Negative for confusion.    Blood pressure 124/76, pulse 75, temperature 98 F (36.7 C), height 5\' 6"  (1.676 m), weight 160 lb (72.576 kg).  Physical Exam Physical Exam  Constitutional: She is oriented to person, place, and time. She appears well-developed and well-nourished. No distress.  BMI 25  HENT:  Head: Normocephalic and atraumatic.  Nose: Nose normal.  Mouth/Throat: No oropharyngeal exudate.  Eyes: Conjunctivae and EOM are normal. Pupils are equal, round, and reactive to light. Left eye exhibits no discharge. No scleral icterus.  Neck: Neck supple. No JVD present. No tracheal deviation  present. No thyromegaly present.  Cardiovascular: Normal rate, regular rhythm, normal heart sounds and intact distal pulses.   No murmur heard. Pulmonary/Chest: Effort normal and breath sounds normal. No respiratory distress. She has no wheezes. She has no rales. She exhibits no tenderness.  Abdominal: Soft. Bowel sounds are normal. She exhibits no distension and no mass. There is no tenderness.  There is no rebound and no guarding.    She has a small partially incarcerated umbilical hernia. This seemed to contain preperitoneal fat. Doubt any intestine trapped in this. The skin is healthy. There may be an epigastric hernia or this may simply be a diastases recti. There is a bulge about 5 cm in diameter in the midline  above the umbilicus which is noticeable when she sits up and less noticeable when she stands. Intermittently I think I can feel something moving through this.  Musculoskeletal: She exhibits no edema and no tenderness.  Lymphadenopathy:    She has no cervical adenopathy.  Neurological: She is alert and oriented to person, place, and time. She exhibits normal muscle tone. Coordination normal.  Skin: Skin is warm. No rash noted. She is not diaphoretic. No erythema. No pallor.  Psychiatric: She has a normal mood and affect. Her behavior is normal. Judgment and thought content normal.    Data Reviewed [facility] care office notes  Assessment    Small, incarcerated umbilical hernia containing omentum or preperitoneal fat.  Question epigastric hernia versus small diastases recti  Allergies  History of cesarean section x2  Multinodular goiter  Hypertension     Plan    I told her I was unsure whether she had one hernia or two. I told her that I would not like to plan any surgery until I was more sure. She will be scheduled for a CT scan of the abdomen and pelvis  Return to see me in 2 weeks after the CT is done and we will decide surgical approach.        Edsel Petrin. Dalbert Batman, M.D., [facility] General and Minimally invasive Surgery Breast and Colorectal Surgery Office:   [phone] Pager:   [phone]  01/15/2014, 4:00 PM

## 2014-01-15 NOTE — Patient Instructions (Signed)
You have an umbilical hernia. I can feel the lump. This is only partially reducible. I do not think that the intestine is caught in the hernia. Elective surgery is advised to have this repaired at some point.  The bulge above your umbilicus might be a hernia or might just be a thinning of the muscles. I am not sure whether this needs to be included in the surgery or not.  Prior to planning this surgery, you will be scheduled for a CT scan of the abdomen and pelvis to make sure were not missing a second hernia of the abdominal wall.  Return to see Dr. Dalbert Batman in approximately 2 weeks after the CT scan is done.

## 2014-01-19 ENCOUNTER — Ambulatory Visit
Admission: RE | Admit: 2014-01-19 | Discharge: 2014-01-19 | Disposition: A | Discharge status: Home / Self Care | Payer: BC Managed Care – PPO | Source: Ambulatory Visit | Attending: General Surgery | Admitting: General Surgery

## 2014-01-19 ENCOUNTER — Telehealth (INDEPENDENT_AMBULATORY_CARE_PROVIDER_SITE_OTHER): Payer: Self-pay

## 2014-01-19 DIAGNOSIS — K420 Umbilical hernia with obstruction, without gangrene: Secondary | ICD-10-CM

## 2014-01-19 MED ORDER — IOHEXOL 300 MG/ML  SOLN
100.0000 mL | Freq: Once | INTRAMUSCULAR | Status: AC | PRN
  Administered 2014-01-19: 100 mL via INTRAVENOUS

## 2014-01-19 NOTE — Telephone Encounter (Signed)
Pt called for results from CT scan performed today.  She was given results.  She has more questions regarding the diagnoses, and would like to speak to Dr. Dalbert Batman.  Her f/u appt is not until 01/30/14.

## 2014-01-22 ENCOUNTER — Telehealth (INDEPENDENT_AMBULATORY_CARE_PROVIDER_SITE_OTHER): Payer: Self-pay | Admitting: General Surgery

## 2014-01-22 NOTE — Telephone Encounter (Signed)
CT scan shows small umbilical hernia and small supraumbilical hernia. Results have been called to the patient. She requested that I call her. I called her cell phone and left a message for her to call me at her convenience.   Vickie Galvan

## 2014-01-22 NOTE — Telephone Encounter (Signed)
[name] called me back to discuss her CT scan findings. I described the 2 hernias in the midline and told her that I would probably want to do this laparoscopically. She is coming back in the office next week to see me for another exam so that I can describe the procedure in greater detail. She seemed to appreciate this information.   Vickie Galvan

## 2014-01-25 ENCOUNTER — Encounter: Payer: Self-pay | Admitting: Gastroenterology

## 2014-01-30 ENCOUNTER — Ambulatory Visit (INDEPENDENT_AMBULATORY_CARE_PROVIDER_SITE_OTHER): Payer: BC Managed Care – PPO | Admitting: General Surgery

## 2014-01-30 ENCOUNTER — Encounter (INDEPENDENT_AMBULATORY_CARE_PROVIDER_SITE_OTHER): Payer: Self-pay | Admitting: General Surgery

## 2014-01-30 VITALS — BP 122/82 | HR 77 | Temp 97.6°F | Ht 66.0 in | Wt 160.0 lb

## 2014-01-30 DIAGNOSIS — K420 Umbilical hernia with obstruction, without gangrene: Secondary | ICD-10-CM

## 2014-01-30 DIAGNOSIS — K439 Ventral hernia without obstruction or gangrene: Secondary | ICD-10-CM

## 2014-01-30 NOTE — Patient Instructions (Signed)
The CT scan shows that you have 2 hernias, one above the other. The larger hernia is called epigastric hernia and it contains fat only. The smaller hernias is an  umbilical hernia and it shows entrapped fat. Neither hernia shows involvement of the intestine at this time.  We have talked about your options. You stated that you would like to go ahead and have the surgery to repair this now  You will be scheduled for laparoscopic repair of your epigastric and umbilical hernia with mesh, possible open repair in the near future.    Laparoscopic Ventral Hernia Repair Laparoscopic ventral hernia repairis a surgery to fix a ventral hernia. Aventral hernia, also called an incisional hernia, is a bulge of body tissue or intestines that pushes through the front part of the abdomen. This can happen if the connective tissue covering the muscles over the abdomen has a weak spot or is torn because of a surgical cut (incision) from a previous surgery. Laparoscopic ventral hernia repair is often done soon after diagnosis to stop the hernia from getting bigger, becoming uncomfortable, or becoming an emergency. This surgery usually takes about 2 hours, but the time can vary greatly. LET [facility] CARE PROVIDER KNOW ABOUT:  Any allergies you have.  All medicines you are taking, including steroids, vitamins, herbs, eye drops, creams, and over-the-counter medicines.  Previous problems you or members of your family have had with the use of anesthetics.  Any blood disorders you have.  Previous surgeries you have had.  Medical conditions you have. RISKS AND COMPLICATIONS  Generally, laparoscopic ventral hernia repair is a safe procedure. However, as with any surgical procedure, problems can occur. Possible problems include:  Bleeding.  Trouble passing urine or having a bowel movement after the surgery.  Infection.  Pneumonia.  Blood clots.  Pain in the area of the hernia.  A bulge in the area of the  hernia that may be caused by a collection of fluid.  Injury to intestines or other structures in the abdomen.  Return of the hernia after surgery. In some cases, your health care provider may need to stop the laparoscopic procedure and do regular, open surgery. This may be necessary for very difficult hernias, when organs are hard to see, or when bleeding problems occur during surgery. BEFORE THE PROCEDURE   You may need to have blood tests, urine tests, a chest X-ray, or an electrocardiogram done before the day of the surgery.  Ask your health care provider about changing or stopping your regular medicines. This is especially important if you are taking diabetes medicines or blood thinners.  You may need to wash with a special type of germ-killing soap.  Do not eat or drink anything after midnight the night before the procedure or as directed by your health care provider.  Make plans to have someone drive you home after the procedure. PROCEDURE   Small monitors will be put on your body. They are used to check your heart, blood pressure, and oxygen level.  An IV access tube will be put into a vein in your hand or arm. Fluids and medicine will flow directly into your body through the IV tube.  You will be given medicine that makes you go to sleep (general anesthetic).  Your abdomen will be cleaned with a special soap to kill any germs on your skin.  Once you are asleep, several small incisions will be made in your abdomen.  The large space in your abdomen will be  filled with air so that it expands. This gives your health care provider more room and a better view.  A thin, lighted tube with a tiny camera on the end (laparoscope) is put through a small incision in your abdomen. The camera on the laparoscope sends a picture to a TV screen in the operating room. This gives your health care provider a good view inside your abdomen.  Hollow tubes are put through the other small incisions in  your abdomen. The tools needed for the procedure are put through these tubes.  Your health care provider puts the tissue or intestines that formed the hernia back in place.  A screen-like patch (mesh) is used to close the hernia. This helps make the area stronger. Stitches, tacks, or staples are used to keep the mesh in place.  Medicine and a bandage (dressing) or skin glue will be put over the incisions. AFTER THE PROCEDURE   You will stay in a recovery area until the anesthetic wears off. Your blood pressure and pulse will be checked often.  You may be able to go home the same day or may need to stay in the hospital for 1-2 days after surgery. Your health care provider will decide when you can go home.  You may feel some pain. You may be given medicine for pain.  You will be urged to do breathing exercises that involve taking deep breaths. This helps prevent a lung infection after a surgery.  You may have to wear compression stockings while you are in the hospital. These stockings help keep blood clots from forming in your legs. Document Released: 05/25/2012 Document Revised: 06/13/2013 Document Reviewed: 05/25/2012 [facility] Patient Information 2015 [place], [place]. This information is not intended to replace advice given to you by your health care provider. Make sure you discuss any questions you have with your health care provider.

## 2014-01-30 NOTE — Progress Notes (Signed)
Patient ID: Vickie Galvan, female   DOB: 1976/06/09, 38 y.o.   MRN: [id number]  History: This patient returns for discussion of her symptomatic abdominal wall hernias. Her CT scan shows a small umbilical hernia and a medium-sized epigastric hernia containing fat only. There is no involvement of the intestine. Small right kidney stone is noted. I discussed all these findings with her. There does not appear to be any indication for intervention for her kidney stone. She says the hernias are somewhat uncomfortable and she is afraid to let and getting larger. She asked that I would go ahead and repair this at this time. I feel that it is reasonable and I think this is a good situation for a laparoscopic approach.  Past history, family history, social history, and review of systems are documented on the chart, unchanged, and noncontributory except as described above  Exam: Constitutional: She is oriented to person, place, and time. She appears well-developed and well-nourished. No distress.  BMI 25   Head: Normocephalic and atraumatic.  Nose: Nose normal.  Mouth/Throat: No oropharyngeal exudate.  Eyes: Conjunctivae and EOM are normal. Pupils are equal, round, and reactive to light. Left eye exhibits no discharge. No scleral icterus.  Neck: Neck supple. No JVD present. No tracheal deviation present. No thyromegaly present.  Cardiovascular: Normal rate, regular rhythm, normal heart sounds and intact distal pulses.  No murmur heard.  Pulmonary/Chest: Effort normal and breath sounds normal. No respiratory distress. She has no wheezes. She has no rales. She exhibits no tenderness.  Abdominal: Soft. Bowel sounds are normal. She exhibits no distension and no mass. There is no tenderness. There is no rebound and no guarding.    She has a small partially incarcerated umbilical hernia. This seemed to contain preperitoneal fat. Doubt any intestine trapped in this. The skin is healthy. There may be an  epigastric hernia or this may simply be a diastases recti. There is a bulge about 5 cm in diameter in the midline above the umbilicus which is noticeable when she sits up and less noticeable when she stands. Intermittently I think I can feel something moving through this.  Musculoskeletal: She exhibits no edema and no tenderness.  Lymphadenopathy:  She has no cervical adenopathy.  Neurological: She is alert and oriented to person, place, and time. She exhibits normal muscle tone. Coordination normal.  Skin: Skin is warm. No rash noted. She is not diaphoretic. No erythema. No pallor.  Psychiatric: She has a normal mood and affect. Her behavior is normal. Judgment and thought content normal.   Assessment: Small, incarcerated umbilical hernia containing omentum or preperitoneal fat.  Incarcerated epigastric hernia Asymptomatic, tiny, right kidney stone History of cesarean section x2  Multinodular goiter  Hypertension   Plan: Proceed with scheduling of laparoscopic repair of multiple ventral hernias with mesh, possible open repair in the future  I discussed the indications, details, technique, and numerous risk of the surgery with her. She's aware of risk of bleeding, infection, conversion to open laparotomy, recurrence, injury to adjacent organs with major reconstructive surgery, nerve damage with chronic pain, and other unforeseen problems. She understands all these issues. At this time all her questions were answered and she agrees with this plan.    Edsel Petrin. Dalbert Batman, M.D., [facility] General and Minimally invasive Surgery Breast and Colorectal Surgery Office:   [phone] Pager:   [phone]

## 2014-03-26 ENCOUNTER — Other Ambulatory Visit (INDEPENDENT_AMBULATORY_CARE_PROVIDER_SITE_OTHER): Payer: Self-pay | Admitting: General Surgery

## 2014-03-30 ENCOUNTER — Encounter: Payer: Self-pay | Admitting: Endocrinology

## 2014-03-30 ENCOUNTER — Ambulatory Visit (INDEPENDENT_AMBULATORY_CARE_PROVIDER_SITE_OTHER): Payer: BC Managed Care – PPO | Admitting: Endocrinology

## 2014-03-30 VITALS — BP 122/80 | HR 97 | Temp 98.7°F | Ht 66.0 in | Wt 160.0 lb

## 2014-03-30 DIAGNOSIS — Z23 Encounter for immunization: Secondary | ICD-10-CM

## 2014-03-30 DIAGNOSIS — E042 Nontoxic multinodular goiter: Secondary | ICD-10-CM

## 2014-03-30 LAB — TSH: TSH: 1.34 u[IU]/mL (ref 0.35–4.50)

## 2014-03-30 LAB — T4, FREE: Free T4: 0.83 ng/dL (ref 0.60–1.60)

## 2014-03-30 NOTE — Patient Instructions (Signed)
blood tests are being requested for you today.  We'll contact you with results.   Please come back for a follow-up appointment in 6 months.   most of the time, a "lumpy thyroid" will eventually become overactive again.  this is usually a slow process, happening over the span of many years.    

## 2014-03-30 NOTE — Progress Notes (Signed)
Subjective:    Patient ID: Vickie Galvan, female    DOB: 03-Nov-1975, 38 y.o.   MRN: [id number]  HPI Pt returns for f/u of multinodular goiter (dx'ed 2010--bx was benign then; she developed a suppressed TSH in late 2013, and was rx'ed with i-131 in February of 2014; she became euthyroid off any thyroid rx; f/u US in early 2015 showed slight decrease in size of the goiter).  Since then, pt states she feels well in general, except for fatigue.   Past Medical History  Diagnosis Date  . Anxiety   . Environmental allergies   . ANEMIA, IRON DEFICIENCY   . GERD   . GOITER, MULTINODULAR   . HYPERTENSION   . CONSTIPATION     Past Surgical History  Procedure Laterality Date  . Tubal ligation      History   Social History  . Marital Status: Married    Spouse Name: N/A    Number of Children: N/A  . Years of Education: N/A   Occupational History  . Not on file.   Social History Main Topics  . Smoking status: Never Smoker   . Smokeless tobacco: Not on file  . Alcohol Use: Yes     Comment: occasional  . Drug Use: No  . Sexual Activity: Not on file   Other Topics Concern  . Not on file   Social History Narrative  . No narrative on file    Current Outpatient Prescriptions on File Prior to Visit  Medication Sig Dispense Refill  . ALPRAZolam (XANAX) 0.25 MG tablet Take 1 tablet (0.25 mg total) by mouth 2 (two) times daily as needed for anxiety.  60 tablet  1  . KLOR-CON 10 10 MEQ tablet TAKE 1 TABLET (10 MEQ TOTAL) BY MOUTH DAILY.  30 tablet  11  . losartan-hydrochlorothiazide (HYZAAR) 100-12.5 MG per tablet Take 1 tablet by mouth daily.  30 tablet  11  . mometasone (NASONEX) 50 MCG/ACT nasal spray Place 2 sprays into the nose 2 (two) times daily.  17 g  11  . pseudoephedrine-guaifenesin (MUCINEX D) 60-600 MG per tablet Take 1 tablet by mouth every 12 (twelve) hours.       No current facility-administered medications on file prior to visit.    Allergies  Allergen Reactions   . Azithromycin Other (See Comments)    abd pain    Family History  Problem Relation Age of Onset  . Hypertension Maternal Grandmother   . Hypertension Maternal Grandfather   . Diabetes Maternal Grandfather   . Stroke Maternal Grandfather     BP 122/80  Pulse 97  Temp(Src) 98.7 F (37.1 C) (Oral)  Ht 5\' 6"  (1.676 m)  Wt 160 lb (72.576 kg)  BMI 25.84 kg/m2  SpO2 97%    Review of Systems Denies weight change.      Objective:   Physical Exam VITAL SIGNS:  See vs page.   GENERAL: no distress. NECK: thyroid is 5x normal size, with multinodular surface.    Lab Results  Component Value Date   TSH 1.34 03/30/2014      Assessment & Plan:  Hyperthyroidism, resolved with I-131 rx.  However, she is at risk for recurrence.    Patient is advised the following: Patient Instructions  blood tests are being requested for you today.  We'll contact you with results.  Please come back for a follow-up appointment in 6 months.  most of the time, a "lumpy thyroid" will eventually become overactive again.  this is usually a slow process, happening over the span of many years.

## 2014-04-13 ENCOUNTER — Encounter (HOSPITAL_COMMUNITY): Payer: Self-pay | Admitting: Pharmacy Technician

## 2014-04-25 ENCOUNTER — Other Ambulatory Visit: Payer: Self-pay

## 2014-04-25 DIAGNOSIS — F419 Anxiety disorder, unspecified: Secondary | ICD-10-CM

## 2014-04-25 MED ORDER — ALPRAZOLAM 0.25 MG PO TABS
0.2500 mg | ORAL_TABLET | Freq: Two times a day (BID) | ORAL | Status: DC | PRN
Start: 2014-04-25 — End: 2014-09-05

## 2014-04-25 NOTE — Telephone Encounter (Signed)
Done hardcopy to [facility]bin  

## 2014-04-25 NOTE — Pre-Procedure Instructions (Signed)
TAYLR MEUTH  04/25/2014   Your procedure is scheduled on:  Friday May 04, 2014 at 7:30 AM.  Report to [facility] Admitting at 5:30 AM.  Call this number if you have problems the morning of surgery: [phone]   Call this number if you have any questions prior to surgery: [phone]   Remember:   Do not eat food or drink liquids after midnight.   Take these medicines the morning of surgery with A SIP OF WATER: Alprazolam (Xanax) if needed   Discontinue any aspirin or herbal medications 5 days prior to surgery    Do not wear jewelry, make-up or nail polish.  Do not wear lotions, powders, or perfumes.   Do not shave 48 hours prior to surgery.   Do not bring valuables to the hospital.  [facility] is not responsible for any belongings or valuables.               Contacts, dentures or bridgework may not be worn into surgery.  Leave suitcase in the car. After surgery it may be brought to your room.  For patients admitted to the hospital, discharge time is determined by your treatment team.               Patients discharged the day of surgery will not be allowed to drive home.  Name and phone number of your driver: Family/Friend  Special Instructions: Shower using CHG soap the night before and the morning of your surgery   Please read over the following fact sheets that you were given: Pain Booklet, Coughing and Deep Breathing and Surgical Site Infection Prevention

## 2014-04-26 ENCOUNTER — Encounter (HOSPITAL_COMMUNITY)
Admission: RE | Admit: 2014-04-26 | Discharge: 2014-04-26 | Disposition: A | Discharge status: Home / Self Care | Payer: BC Managed Care – PPO | Source: Ambulatory Visit | Attending: General Surgery | Admitting: General Surgery

## 2014-04-26 ENCOUNTER — Encounter (HOSPITAL_COMMUNITY): Payer: Self-pay

## 2014-04-26 DIAGNOSIS — Z01812 Encounter for preprocedural laboratory examination: Secondary | ICD-10-CM | POA: Insufficient documentation

## 2014-04-26 DIAGNOSIS — K439 Ventral hernia without obstruction or gangrene: Secondary | ICD-10-CM | POA: Insufficient documentation

## 2014-04-26 DIAGNOSIS — Z01810 Encounter for preprocedural cardiovascular examination: Secondary | ICD-10-CM | POA: Insufficient documentation

## 2014-04-26 LAB — URINALYSIS, ROUTINE W REFLEX MICROSCOPIC
Bilirubin Urine: NEGATIVE
Glucose, UA: NEGATIVE mg/dL
Ketones, ur: NEGATIVE mg/dL
LEUKOCYTES UA: NEGATIVE
NITRITE: NEGATIVE
PROTEIN: NEGATIVE mg/dL
Specific Gravity, Urine: 1.025 (ref 1.005–1.030)
UROBILINOGEN UA: 0.2 mg/dL (ref 0.0–1.0)
pH: 6 (ref 5.0–8.0)

## 2014-04-26 LAB — HCG, SERUM, QUALITATIVE: Preg, Serum: NEGATIVE

## 2014-04-26 LAB — CBC WITH DIFFERENTIAL/PLATELET
Basophils Absolute: 0 10*3/uL (ref 0.0–0.1)
Basophils Relative: 1 % (ref 0–1)
EOS ABS: 0.1 10*3/uL (ref 0.0–0.7)
EOS PCT: 3 % (ref 0–5)
HEMATOCRIT: 33.6 % — AB (ref 36.0–46.0)
Hemoglobin: 11.1 g/dL — ABNORMAL LOW (ref 12.0–15.0)
LYMPHS ABS: 2.2 10*3/uL (ref 0.7–4.0)
LYMPHS PCT: 44 % (ref 12–46)
MCH: 31.7 pg (ref 26.0–34.0)
MCHC: 33 g/dL (ref 30.0–36.0)
MCV: 96 fL (ref 78.0–100.0)
MONO ABS: 0.3 10*3/uL (ref 0.1–1.0)
Monocytes Relative: 7 % (ref 3–12)
Neutro Abs: 2.3 10*3/uL (ref 1.7–7.7)
Neutrophils Relative %: 45 % (ref 43–77)
Platelets: 292 10*3/uL (ref 150–400)
RBC: 3.5 MIL/uL — ABNORMAL LOW (ref 3.87–5.11)
RDW: 13.4 % (ref 11.5–15.5)
WBC: 5 10*3/uL (ref 4.0–10.5)

## 2014-04-26 LAB — COMPREHENSIVE METABOLIC PANEL
ALT: 15 U/L (ref 0–35)
AST: 21 U/L (ref 0–37)
Albumin: 3.2 g/dL — ABNORMAL LOW (ref 3.5–5.2)
Alkaline Phosphatase: 63 U/L (ref 39–117)
Anion gap: 10 (ref 5–15)
BUN: 10 mg/dL (ref 6–23)
CALCIUM: 8.9 mg/dL (ref 8.4–10.5)
CO2: 27 meq/L (ref 19–32)
Chloride: 103 mEq/L (ref 96–112)
Creatinine, Ser: 0.79 mg/dL (ref 0.50–1.10)
GLUCOSE: 114 mg/dL — AB (ref 70–99)
Potassium: 3.8 mEq/L (ref 3.7–5.3)
Sodium: 140 mEq/L (ref 137–147)
TOTAL PROTEIN: 7.8 g/dL (ref 6.0–8.3)
Total Bilirubin: <0.2 mg/dL — ABNORMAL LOW (ref 0.3–1.2)

## 2014-04-26 LAB — URINE MICROSCOPIC-ADD ON

## 2014-04-26 NOTE — Progress Notes (Signed)
   04/26/14 1118  OBSTRUCTIVE SLEEP APNEA  Have you ever been diagnosed with sleep apnea through a sleep study? No  Do you snore loudly (loud enough to be heard through closed doors)?  1  Do you often feel tired, fatigued, or sleepy during the daytime? 1  Has anyone observed you stop breathing during your sleep? 1  Do you have, or are you being treated for high blood pressure? 1  BMI more than 35 kg/m2? 0  Age over 38 years old? 0  Neck circumference greater than 40 cm/16 inches? 0  Gender: 0  Obstructive Sleep Apnea Score 4  Score 4 or greater  Results sent to PCP   This patient has screened at risk for sleep apnea using the STOP bang tool used during a pre-surgical visit. A score of 4 or greater is at risk for sleep apnea.

## 2014-04-26 NOTE — Telephone Encounter (Signed)
Faxed hardcopy for Alprazolam to [facility] [address] [place].  Patient informed

## 2014-04-26 NOTE — Progress Notes (Signed)
Patient denied having any acute cardiac or pulmonary issues. PCP encounters are in EPIC.

## 2014-04-27 NOTE — Progress Notes (Signed)
Anesthesia Chart Review:  Pt is 38 year old female scheduled for laparoscopic repair of multiple incarcerated ventral hernias on 05/04/2014 with Dr. Dalbert Batman.   PMH: HTN, iron deficiency anemia, multinodular goiter,GERD, anxiety  Medications include: losartan/hctz, potassium, xanax  Preoperative labs reviewed.  Hgb/hct 11.1/33.6  EKG: NSR. Anteroseptal infarct, age undetermined. Compared with previous 12/09/2012, PVCs resolved, R wave lower in V3.   If no changes, I anticipate pt can proceed with surgery as scheduled.    Willeen Cass, FNP-BC [facility]/Anesthesiology Phone: [phone] 04/27/2014 2:38 PM

## 2014-05-03 MED ORDER — CEFAZOLIN SODIUM-DEXTROSE 2-3 GM-% IV SOLR
2.0000 g | INTRAVENOUS | Status: AC
  Administered 2014-05-04: 2 g via INTRAVENOUS
  Filled 2014-05-03: qty 50

## 2014-05-03 NOTE — H&P (Signed)
  History: This patient returns for discussion of her symptomatic abdominal wall hernias. Her CT scan shows a small umbilical hernia and a medium-sized epigastric hernia containing fat only. There is no involvement of the intestine. Small right kidney stone is noted. I discussed all these findings with her. There does not appear to be any indication for intervention for her kidney stone. She says the hernias are somewhat uncomfortable and she is afraid to let and getting larger. She asked that I would go ahead and repair this at this time. I feel that it is reasonable and I think this is a good situation for a laparoscopic approach.  Past history, family history, social history, and review of systems are documented on the chart, unchanged, and noncontributory except as described above  Exam: Constitutional: She is oriented to person, place, and time. She appears well-developed and well-nourished. No distress.  BMI 25  Head: Normocephalic and atraumatic.  Nose: Nose normal.  Mouth/Throat: No oropharyngeal exudate.  Eyes: Conjunctivae and EOM are normal. Pupils are equal, round, and reactive to light. Left eye exhibits no discharge. No scleral icterus.  Neck: Neck supple. No JVD present. No tracheal deviation present. No thyromegaly present.  Cardiovascular: Normal rate, regular rhythm, normal heart sounds and intact distal pulses.  No murmur heard.  Pulmonary/Chest: Effort normal and breath sounds normal. No respiratory distress. She has no wheezes. She has no rales. She exhibits no tenderness.  Abdominal: Soft. Bowel sounds are normal. She exhibits no distension and no mass. There is no tenderness. There is no rebound and no guarding.    She has a small partially incarcerated umbilical hernia. This seemed to contain preperitoneal fat. Doubt any intestine trapped in this. The skin is healthy. There may be an epigastric hernia or this may simply be a diastases recti. There is a bulge  about 5 cm in diameter in the midline above the umbilicus which is noticeable when she sits up and less noticeable when she stands. Intermittently I think I can feel something moving through this.  Musculoskeletal: She exhibits no edema and no tenderness.  Lymphadenopathy:  She has no cervical adenopathy.  Neurological: She is alert and oriented to person, place, and time. She exhibits normal muscle tone. Coordination normal.  Skin: Skin is warm. No rash noted. She is not diaphoretic. No erythema. No pallor.  Psychiatric: She has a normal mood and affect. Her behavior is normal. Judgment and thought content normal.   Assessment: Small, incarcerated umbilical hernia containing omentum or preperitoneal fat.  Incarcerated epigastric hernia Asymptomatic, tiny, right kidney stone History of cesarean section x2  Multinodular goiter  Hypertension   Plan: Proceed with scheduling of laparoscopic repair of multiple ventral hernias with mesh, possible open repair in the future  I discussed the indications, details, technique, and numerous risk of the surgery with her. She's aware of risk of bleeding, infection, conversion to open laparotomy, recurrence, injury to adjacent organs with major reconstructive surgery, nerve damage with chronic pain, and other unforeseen problems. She understands all these issues. At this time all her questions were answered and she agrees with this plan.    Edsel Petrin. Dalbert Batman, M.D., [facility] General and Minimally invasive Surgery Breast and Colorectal Surgery Office: [phone] 7:24 AM 05/03/2014

## 2014-05-04 ENCOUNTER — Ambulatory Visit (HOSPITAL_COMMUNITY): Payer: BC Managed Care – PPO | Admitting: Emergency Medicine

## 2014-05-04 ENCOUNTER — Ambulatory Visit (HOSPITAL_COMMUNITY): Payer: BC Managed Care – PPO | Admitting: Critical Care Medicine

## 2014-05-04 ENCOUNTER — Encounter (HOSPITAL_COMMUNITY): Payer: Self-pay | Admitting: Critical Care Medicine

## 2014-05-04 ENCOUNTER — Ambulatory Visit (HOSPITAL_COMMUNITY)
Admission: RE | Admit: 2014-05-04 | Discharge: 2014-05-06 | Disposition: A | Discharge status: Home / Self Care | Payer: BC Managed Care – PPO | Source: Ambulatory Visit | Attending: General Surgery | Admitting: General Surgery

## 2014-05-04 ENCOUNTER — Encounter (HOSPITAL_COMMUNITY)
Admission: RE | Disposition: A | Discharge status: Home / Self Care | Payer: Self-pay | Source: Ambulatory Visit | Attending: General Surgery

## 2014-05-04 DIAGNOSIS — K219 Gastro-esophageal reflux disease without esophagitis: Secondary | ICD-10-CM | POA: Insufficient documentation

## 2014-05-04 DIAGNOSIS — K436 Other and unspecified ventral hernia with obstruction, without gangrene: Secondary | ICD-10-CM | POA: Insufficient documentation

## 2014-05-04 DIAGNOSIS — I10 Essential (primary) hypertension: Secondary | ICD-10-CM | POA: Diagnosis not present

## 2014-05-04 DIAGNOSIS — N920 Excessive and frequent menstruation with regular cycle: Secondary | ICD-10-CM | POA: Insufficient documentation

## 2014-05-04 DIAGNOSIS — K429 Umbilical hernia without obstruction or gangrene: Secondary | ICD-10-CM | POA: Diagnosis not present

## 2014-05-04 DIAGNOSIS — E042 Nontoxic multinodular goiter: Secondary | ICD-10-CM | POA: Diagnosis not present

## 2014-05-04 DIAGNOSIS — K439 Ventral hernia without obstruction or gangrene: Secondary | ICD-10-CM | POA: Diagnosis present

## 2014-05-04 HISTORY — PX: VENTRAL HERNIA REPAIR: SHX424

## 2014-05-04 HISTORY — PX: INSERTION OF MESH: SHX5868

## 2014-05-04 SURGERY — REPAIR, HERNIA, VENTRAL, LAPAROSCOPIC
Anesthesia: General | Site: Abdomen

## 2014-05-04 MED ORDER — HYDROMORPHONE HCL 1 MG/ML IJ SOLN
INTRAMUSCULAR | Status: AC
  Filled 2014-05-04: qty 1

## 2014-05-04 MED ORDER — CHLORHEXIDINE GLUCONATE 4 % EX LIQD: 1.0000 "application " | Freq: Once | CUTANEOUS | Status: DC

## 2014-05-04 MED ORDER — OXYCODONE HCL 5 MG/5ML PO SOLN: 5.0000 mg | Freq: Once | ORAL | Status: DC | PRN

## 2014-05-04 MED ORDER — FENTANYL CITRATE 0.05 MG/ML IJ SOLN
INTRAMUSCULAR | Status: AC
  Filled 2014-05-04: qty 5

## 2014-05-04 MED ORDER — NEOSTIGMINE METHYLSULFATE 10 MG/10ML IV SOLN
INTRAVENOUS | Status: DC | PRN
  Administered 2014-05-04: 5 mg via INTRAVENOUS

## 2014-05-04 MED ORDER — OXYCODONE-ACETAMINOPHEN 5-325 MG PO TABS
1.0000 | ORAL_TABLET | ORAL | Status: DC | PRN
  Administered 2014-05-04: 1 via ORAL
  Administered 2014-05-04 – 2014-05-05 (×3): 2 via ORAL
  Filled 2014-05-04 (×4): qty 2

## 2014-05-04 MED ORDER — NEOSTIGMINE METHYLSULFATE 10 MG/10ML IV SOLN
INTRAVENOUS | Status: AC
  Filled 2014-05-04: qty 1

## 2014-05-04 MED ORDER — GLYCOPYRROLATE 0.2 MG/ML IJ SOLN
INTRAMUSCULAR | Status: DC | PRN
  Administered 2014-05-04: 0.6 mg via INTRAVENOUS

## 2014-05-04 MED ORDER — BUPIVACAINE-EPINEPHRINE 0.25% -1:200000 IJ SOLN
INTRAMUSCULAR | Status: DC | PRN
  Administered 2014-05-04: 30 mL

## 2014-05-04 MED ORDER — WHITE PETROLATUM GEL
Status: AC
  Administered 2014-05-04: 11:00:00
  Filled 2014-05-04: qty 5

## 2014-05-04 MED ORDER — ONDANSETRON HCL 4 MG PO TABS: 4.0000 mg | ORAL_TABLET | Freq: Four times a day (QID) | ORAL | Status: DC | PRN

## 2014-05-04 MED ORDER — MORPHINE SULFATE 2 MG/ML IJ SOLN
2.0000 mg | Freq: Once | INTRAMUSCULAR | Status: AC
  Administered 2014-05-04: 2 mg via INTRAVENOUS

## 2014-05-04 MED ORDER — GLYCOPYRROLATE 0.2 MG/ML IJ SOLN
INTRAMUSCULAR | Status: AC
  Filled 2014-05-04: qty 3

## 2014-05-04 MED ORDER — FENTANYL CITRATE 0.05 MG/ML IJ SOLN
INTRAMUSCULAR | Status: DC | PRN
  Administered 2014-05-04: 100 ug via INTRAVENOUS
  Administered 2014-05-04: 50 ug via INTRAVENOUS
  Administered 2014-05-04: 25 ug via INTRAVENOUS
  Administered 2014-05-04: 100 ug via INTRAVENOUS
  Administered 2014-05-04: 25 ug via INTRAVENOUS
  Administered 2014-05-04: 50 ug via INTRAVENOUS

## 2014-05-04 MED ORDER — PROPOFOL 10 MG/ML IV BOLUS
INTRAVENOUS | Status: AC
  Filled 2014-05-04: qty 20

## 2014-05-04 MED ORDER — ALPRAZOLAM 0.25 MG PO TABS: 0.2500 mg | ORAL_TABLET | Freq: Two times a day (BID) | ORAL | Status: DC | PRN

## 2014-05-04 MED ORDER — PROMETHAZINE HCL 25 MG/ML IJ SOLN: 6.2500 mg | INTRAMUSCULAR | Status: DC | PRN

## 2014-05-04 MED ORDER — LIDOCAINE HCL (CARDIAC) 20 MG/ML IV SOLN
INTRAVENOUS | Status: DC | PRN
  Administered 2014-05-04: 80 mg via INTRAVENOUS

## 2014-05-04 MED ORDER — CEFAZOLIN SODIUM-DEXTROSE 2-3 GM-% IV SOLR
2.0000 g | Freq: Three times a day (TID) | INTRAVENOUS | Status: AC
  Administered 2014-05-04 (×2): 2 g via INTRAVENOUS
  Filled 2014-05-04 (×3): qty 50

## 2014-05-04 MED ORDER — HYDROMORPHONE HCL 1 MG/ML IJ SOLN
1.0000 mg | INTRAMUSCULAR | Status: DC | PRN
  Administered 2014-05-04 – 2014-05-05 (×3): 1 mg via INTRAVENOUS
  Filled 2014-05-04 (×4): qty 1

## 2014-05-04 MED ORDER — POTASSIUM CHLORIDE IN NACL 20-0.9 MEQ/L-% IV SOLN
INTRAVENOUS | Status: DC
  Administered 2014-05-04 – 2014-05-05 (×3): via INTRAVENOUS
  Filled 2014-05-04 (×4): qty 1000

## 2014-05-04 MED ORDER — HEPARIN SODIUM (PORCINE) 5000 UNIT/ML IJ SOLN
5000.0000 [IU] | Freq: Three times a day (TID) | INTRAMUSCULAR | Status: DC
  Administered 2014-05-05 – 2014-05-06 (×4): 5000 [IU] via SUBCUTANEOUS
  Filled 2014-05-04 (×7): qty 1

## 2014-05-04 MED ORDER — POTASSIUM CHLORIDE CRYS ER 20 MEQ PO TBCR
20.0000 meq | EXTENDED_RELEASE_TABLET | Freq: Every day | ORAL | Status: DC
  Administered 2014-05-04 – 2014-05-05 (×2): 20 meq via ORAL
  Filled 2014-05-04 (×3): qty 1

## 2014-05-04 MED ORDER — MIDAZOLAM HCL 5 MG/5ML IJ SOLN
INTRAMUSCULAR | Status: DC | PRN
  Administered 2014-05-04: 2 mg via INTRAVENOUS

## 2014-05-04 MED ORDER — ONDANSETRON HCL 4 MG/2ML IJ SOLN
INTRAMUSCULAR | Status: AC
  Filled 2014-05-04: qty 2

## 2014-05-04 MED ORDER — ONDANSETRON HCL 4 MG/2ML IJ SOLN
4.0000 mg | Freq: Four times a day (QID) | INTRAMUSCULAR | Status: DC | PRN
  Administered 2014-05-05: 4 mg via INTRAVENOUS
  Filled 2014-05-04: qty 2

## 2014-05-04 MED ORDER — PROPOFOL 10 MG/ML IV BOLUS
INTRAVENOUS | Status: DC | PRN
  Administered 2014-05-04: 50 mg via INTRAVENOUS
  Administered 2014-05-04: 150 mg via INTRAVENOUS

## 2014-05-04 MED ORDER — FLUTICASONE PROPIONATE 50 MCG/ACT NA SUSP
1.0000 | Freq: Every day | NASAL | Status: DC
  Administered 2014-05-05: 1 via NASAL
  Filled 2014-05-04: qty 16

## 2014-05-04 MED ORDER — SUCCINYLCHOLINE CHLORIDE 20 MG/ML IJ SOLN
INTRAMUSCULAR | Status: AC
  Filled 2014-05-04: qty 1

## 2014-05-04 MED ORDER — MORPHINE SULFATE 2 MG/ML IJ SOLN
INTRAMUSCULAR | Status: AC
  Filled 2014-05-04: qty 1

## 2014-05-04 MED ORDER — 0.9 % SODIUM CHLORIDE (POUR BTL) OPTIME
TOPICAL | Status: DC | PRN
  Administered 2014-05-04: 1000 mL

## 2014-05-04 MED ORDER — SODIUM CHLORIDE 0.9 % IJ SOLN
INTRAMUSCULAR | Status: AC
  Filled 2014-05-04: qty 10

## 2014-05-04 MED ORDER — ONDANSETRON HCL 4 MG/2ML IJ SOLN
INTRAMUSCULAR | Status: DC | PRN
  Administered 2014-05-04: 4 mg via INTRAVENOUS

## 2014-05-04 MED ORDER — POTASSIUM CHLORIDE 20 MEQ PO PACK
20.0000 meq | PACK | Freq: Every day | ORAL | Status: DC
  Filled 2014-05-04: qty 1

## 2014-05-04 MED ORDER — BUPIVACAINE-EPINEPHRINE (PF) 0.25% -1:200000 IJ SOLN
INTRAMUSCULAR | Status: AC
  Filled 2014-05-04: qty 30

## 2014-05-04 MED ORDER — DEXAMETHASONE SODIUM PHOSPHATE 4 MG/ML IJ SOLN
INTRAMUSCULAR | Status: AC
  Filled 2014-05-04: qty 1

## 2014-05-04 MED ORDER — HYDROMORPHONE HCL 1 MG/ML IJ SOLN
0.2500 mg | INTRAMUSCULAR | Status: DC | PRN
  Administered 2014-05-04 (×4): 0.5 mg via INTRAVENOUS

## 2014-05-04 MED ORDER — LOSARTAN POTASSIUM 50 MG PO TABS
100.0000 mg | ORAL_TABLET | Freq: Every day | ORAL | Status: DC
  Administered 2014-05-04 – 2014-05-05 (×2): 100 mg via ORAL
  Filled 2014-05-04 (×3): qty 2

## 2014-05-04 MED ORDER — LOSARTAN POTASSIUM-HCTZ 100-12.5 MG PO TABS: 1.0000 | ORAL_TABLET | Freq: Every day | ORAL | Status: DC

## 2014-05-04 MED ORDER — ROCURONIUM BROMIDE 50 MG/5ML IV SOLN
INTRAVENOUS | Status: AC
  Filled 2014-05-04: qty 1

## 2014-05-04 MED ORDER — ROCURONIUM BROMIDE 100 MG/10ML IV SOLN
INTRAVENOUS | Status: DC | PRN
  Administered 2014-05-04: 50 mg via INTRAVENOUS

## 2014-05-04 MED ORDER — DIPHENHYDRAMINE HCL 50 MG/ML IJ SOLN
25.0000 mg | Freq: Four times a day (QID) | INTRAMUSCULAR | Status: DC | PRN
  Administered 2014-05-04 – 2014-05-05 (×3): 25 mg via INTRAVENOUS
  Filled 2014-05-04 (×4): qty 1

## 2014-05-04 MED ORDER — HYDROCHLOROTHIAZIDE 12.5 MG PO CAPS
12.5000 mg | ORAL_CAPSULE | Freq: Every day | ORAL | Status: DC
  Administered 2014-05-04 – 2014-05-05 (×2): 12.5 mg via ORAL
  Filled 2014-05-04 (×3): qty 1

## 2014-05-04 MED ORDER — EPHEDRINE SULFATE 50 MG/ML IJ SOLN
INTRAMUSCULAR | Status: AC
  Filled 2014-05-04: qty 1

## 2014-05-04 MED ORDER — LIDOCAINE HCL (CARDIAC) 20 MG/ML IV SOLN
INTRAVENOUS | Status: AC
  Filled 2014-05-04: qty 5

## 2014-05-04 MED ORDER — DEXAMETHASONE SODIUM PHOSPHATE 4 MG/ML IJ SOLN
INTRAMUSCULAR | Status: DC | PRN
  Administered 2014-05-04: 4 mg via INTRAVENOUS

## 2014-05-04 MED ORDER — LACTATED RINGERS IV SOLN
INTRAVENOUS | Status: DC | PRN
Start: 2014-05-04 — End: 2014-05-04
  Administered 2014-05-04 (×2): via INTRAVENOUS

## 2014-05-04 MED ORDER — MIDAZOLAM HCL 2 MG/2ML IJ SOLN
INTRAMUSCULAR | Status: AC
  Filled 2014-05-04: qty 2

## 2014-05-04 MED ORDER — OXYCODONE HCL 5 MG PO TABS: 5.0000 mg | ORAL_TABLET | Freq: Once | ORAL | Status: DC | PRN

## 2014-05-04 MED ORDER — ARTIFICIAL TEARS OP OINT
TOPICAL_OINTMENT | OPHTHALMIC | Status: AC
  Filled 2014-05-04: qty 3.5

## 2014-05-04 SURGICAL SUPPLY — 47 items
APPLIER CLIP LOGIC TI 5 (MISCELLANEOUS) IMPLANT
BINDER ABDOMINAL 12 ML 46-62 (SOFTGOODS) ×3 IMPLANT
BLADE SURG ROTATE 9660 (MISCELLANEOUS) IMPLANT
CANISTER SUCTION 2500CC (MISCELLANEOUS) IMPLANT
CHLORAPREP W/TINT 26ML (MISCELLANEOUS) ×3 IMPLANT
COVER SURGICAL LIGHT HANDLE (MISCELLANEOUS) ×3 IMPLANT
DERMABOND ADVANCED (GAUZE/BANDAGES/DRESSINGS) ×2
DERMABOND ADVANCED .7 DNX12 (GAUZE/BANDAGES/DRESSINGS) ×1 IMPLANT
DEVICE SECURE STRAP 25 ABSORB (INSTRUMENTS) ×6 IMPLANT
DEVICE TROCAR PUNCTURE CLOSURE (ENDOMECHANICALS) ×3 IMPLANT
DRAPE LAPAROSCOPIC ABDOMINAL (DRAPES) ×3 IMPLANT
DRAPE UTILITY 15X26 W/TAPE STR (DRAPE) IMPLANT
ELECT REM PT RETURN 9FT ADLT (ELECTROSURGICAL) ×3
ELECTRODE REM PT RTRN 9FT ADLT (ELECTROSURGICAL) ×1 IMPLANT
GLOVE BIO SURGEON STRL SZ 6.5 (GLOVE) ×2 IMPLANT
GLOVE BIO SURGEONS STRL SZ 6.5 (GLOVE) ×1
GLOVE BIOGEL PI IND STRL 7.0 (GLOVE) ×2 IMPLANT
GLOVE BIOGEL PI IND STRL 7.5 (GLOVE) ×1 IMPLANT
GLOVE BIOGEL PI INDICATOR 7.0 (GLOVE) ×4
GLOVE BIOGEL PI INDICATOR 7.5 (GLOVE) ×2
GLOVE EUDERMIC 7 POWDERFREE (GLOVE) ×3 IMPLANT
GLOVE SURG SS PI 7.0 STRL IVOR (GLOVE) ×3 IMPLANT
GOWN STRL REUS W/ TWL LRG LVL3 (GOWN DISPOSABLE) ×2 IMPLANT
GOWN STRL REUS W/ TWL XL LVL3 (GOWN DISPOSABLE) ×1 IMPLANT
GOWN STRL REUS W/TWL LRG LVL3 (GOWN DISPOSABLE) ×4
GOWN STRL REUS W/TWL XL LVL3 (GOWN DISPOSABLE) ×2
KIT BASIN OR (CUSTOM PROCEDURE TRAY) ×3 IMPLANT
KIT ROOM TURNOVER OR (KITS) ×3 IMPLANT
MARKER SKIN DUAL TIP RULER LAB (MISCELLANEOUS) ×3 IMPLANT
MESH VENTRALIGHT ST 4X6IN (Mesh General) ×3 IMPLANT
NEEDLE SPNL 22GX3.5 QUINCKE BK (NEEDLE) ×3 IMPLANT
NS IRRIG 1000ML POUR BTL (IV SOLUTION) ×3 IMPLANT
PAD ARMBOARD 7.5X6 YLW CONV (MISCELLANEOUS) ×6 IMPLANT
SCALPEL HARMONIC ACE (MISCELLANEOUS) ×3 IMPLANT
SCISSORS LAP 5X35 DISP (ENDOMECHANICALS) IMPLANT
SET IRRIG TUBING LAPAROSCOPIC (IRRIGATION / IRRIGATOR) IMPLANT
SLEEVE ENDOPATH XCEL 5M (ENDOMECHANICALS) ×3 IMPLANT
SUT MNCRL AB 4-0 PS2 18 (SUTURE) ×3 IMPLANT
SUT NOVA NAB DX-16 0-1 5-0 T12 (SUTURE) ×6 IMPLANT
TOWEL OR 17X24 6PK STRL BLUE (TOWEL DISPOSABLE) IMPLANT
TOWEL OR 17X26 10 PK STRL BLUE (TOWEL DISPOSABLE) ×3 IMPLANT
TRAY FOLEY CATH 14FRSI W/METER (CATHETERS) IMPLANT
TRAY LAPAROSCOPIC (CUSTOM PROCEDURE TRAY) ×3 IMPLANT
TROCAR XCEL NON-BLD 11X100MML (ENDOMECHANICALS) ×3 IMPLANT
TROCAR XCEL NON-BLD 5MMX100MML (ENDOMECHANICALS) ×3 IMPLANT
TUBING INSUFFLATION (TUBING) ×3 IMPLANT
WATER STERILE IRR 1000ML POUR (IV SOLUTION) IMPLANT

## 2014-05-04 NOTE — Anesthesia Preprocedure Evaluation (Addendum)
Anesthesia Evaluation  Patient identified by MRN, date of birth, ID band Patient awake    Reviewed: Allergy & Precautions, H&P , NPO status , Patient's Chart, lab work & pertinent test results  Airway        Dental  (+) Dental Advisory Given   Pulmonary          Cardiovascular hypertension, Pt. on medications     Neuro/Psych Anxiety    GI/Hepatic GERD-  ,  Endo/Other    Renal/GU      Musculoskeletal   Abdominal   Peds  Hematology  (+) anemia ,   Anesthesia Other Findings   Reproductive/Obstetrics                            Anesthesia Physical Anesthesia Plan  ASA: II  Anesthesia Plan: General   Post-op Pain Management:    Induction: Intravenous  Airway Management Planned: Oral ETT  Additional Equipment:   Intra-op Plan:   Post-operative Plan: Extubation in OR  Informed Consent: I have reviewed the patients History and Physical, chart, labs and discussed the procedure including the risks, benefits and alternatives for the proposed anesthesia with the patient or authorized representative who has indicated his/her understanding and acceptance.   Dental advisory given  Plan Discussed with: Anesthesiologist and Surgeon  Anesthesia Plan Comments:         Anesthesia Quick Evaluation

## 2014-05-04 NOTE — Interval H&P Note (Signed)
History and Physical Interval Note:  05/04/2014 6:52 AM  Vickie Galvan  has presented today for surgery, with the diagnosis of multiple incarecerated ventral hernias  She is aware that the goals of the surgery are to repair the hernias and stop the progression of pain and enlargement, but wiill not alter digestive function or current symptoms..The goals and the various methods of treatment have been discussed with the patient and family. After consideration of risks, benefits and other options for treatment, the patient has consented to  Procedure(s): [place], POSSIBLE OPEN (N/A) INSERTION OF MESH (N/A) as a surgical intervention .  The patient's history has been reviewed, patient examined today, no change in status, stable for surgery.  I have reviewed the patient's chart and labs.  Questions were answered to the patient's satisfaction.     Adin Hector

## 2014-05-04 NOTE — Plan of Care (Signed)
Problem: Phase I Progression Outcomes Goal: OOB as tolerated unless otherwise ordered Outcome: Progressing     

## 2014-05-04 NOTE — Op Note (Signed)
Patient Name:           Vickie Galvan   Date of Surgery:        05/04/2014  Pre op Diagnosis:      Multiple ventral hernias  Post op Diagnosis:    same  Procedure:                 Laparoscopic repair of multiple ventral hernias with 15 cm x 10 cm ventraLite- ST mesh  Surgeon:                     Edsel Petrin. Dalbert Batman, M.D., FACS  Assistant:                      OR staff  Operative Indications:   This patient returns for discussion of her symptomatic abdominal wall hernias. Her CT scan shows a small umbilical hernia and a medium-sized epigastric hernia containing fat only. There is no involvement of the intestine. Small right kidney stone is noted. I discussed all these findings with her. There does not appear to be any indication for intervention for her kidney stone. She says the hernias are somewhat uncomfortable and she is afraid to let them get larger. She asked that I would go ahead and repair this at this time. I feel that it is reasonable and I think this is a good situation for a laparoscopic approach  Operative Findings:       There was a small umbilical hernia but the fatty tissue fell out of this without much difficulty. There was a small midline hernia just above this, either a small epigastric hernia or a variation of a diastases recti. I took down the falciform ligament. The overlap on the umbilical hernia was 5 cm below and about 10 cm above. No other abnormalities were noted  Procedure in Detail:          Following the induction of general endotracheal anesthesia the patient's abdomen was prepped and draped in a sterile fashion, surgical timeout was performed, and intravenous antibiotics were given. 0.5% Marcaine with epinephrine was used as a local infiltration anesthetic. A 5 mm optical trocar was placed in the left subcostal region without difficulty. Pneumoperitoneum was created and video camera was inserted. There is no evidence of bleeding or injury. 11 mm trocars was  placed  in the left flank and a 5 mm trocar placed in the left lower quadrant. Exploration revealed findings as above. I used a spinal needle to map out the hernias and found that I could get a 5-6 cm overlap in all directions if I used a 10 x 15 cm piece of mesh. This was brought to the operative field. I drew a template on the abdominal wall for for suture fixation sites at the 4 cardinal positions. I was very careful to mark the rough side of the mesh up and the smooth side down. I placed 4 sutures of #1 Novafil of the edge of the mesh. The mesh was moistened rolled up and inserted into the abdominal cavity. I spread out the mesh orienting it to the drawn template. I made a small puncture wound at each of the 4 suture fixation sites and drew the Novafil sutures up to the abdominal wall, being careful to take a 1 cm bite of tissue. I lifted up the mesh and it appeared to deploy nicely in all directions without any redundancy. The four Novafil sutures were tied. I then used  the secure strap device and secured the mesh around the perimeter and internally as a double crown technique. I made sure that the mesh was secured no more than 1 cm space between each of the attacks. This was inspected repeatedly and it looked good. There was no bleeding. The trocars were removed. Pneumoperitoneum was released. Skin incisions were closed with subcuticular 4-0 Monocryl sutures and Dermabond. Abdominal binder was placed the patient taken to PACU in stable condition. EBL 10 mL. Counts correct. Competitions none.     Edsel Petrin. Dalbert Batman, M.D., FACS General and Minimally Invasive Surgery Breast and Colorectal Surgery  05/04/2014 8:34 AM

## 2014-05-04 NOTE — Transfer of Care (Signed)
Immediate Anesthesia Transfer of Care Note  Patient: Vickie Galvan  Procedure(s) Performed: Procedure(s): LAPAROSCOPIC REPAIR MUTIPLE INCARCERATED VENTRAL HERNIAS (N/A) INSERTION OF MESH (N/A)  Patient Location: PACU  Anesthesia Type:General  Level of Consciousness: awake, alert  and oriented  Airway & Oxygen Therapy: Patient Spontanous Breathing and Patient connected to nasal cannula oxygen  Post-op Assessment: Report given to PACU RN, Post -op Vital signs reviewed and stable and Patient moving all extremities X 4  Post vital signs: Reviewed and stable  Complications: No apparent anesthesia complications

## 2014-05-04 NOTE — Anesthesia Procedure Notes (Signed)
Procedure Name: Intubation Date/Time: 05/04/2014 7:30 AM Performed by: Carola Frost Pre-anesthesia Checklist: Patient identified, Timeout performed, Emergency Drugs available, Suction available and Patient being monitored Patient Re-evaluated:Patient Re-evaluated prior to inductionOxygen Delivery Method: Circle system utilized Preoxygenation: Pre-oxygenation with 100% oxygen Intubation Type: IV induction Ventilation: Mask ventilation with difficulty and Oral airway inserted - appropriate to patient size Laryngoscope Size: 3 and Mac Grade View: Grade III Tube type: Oral Tube size: 7.0 mm Number of attempts: 2 Airway Equipment and Method: Stylet Placement Confirmation: CO2 detector,  positive ETCO2,  ETT inserted through vocal cords under direct vision and breath sounds checked- equal and bilateral Secured at: 21 cm Tube secured with: Tape Dental Injury: Teeth and Oropharynx as per pre-operative assessment  Comments: DLX1 with MAC 3 - unsuccessful attempt.  DLX1 with MAC 3 - 7.0 ETT inserted atraumatically.

## 2014-05-04 NOTE — Anesthesia Postprocedure Evaluation (Signed)
  Anesthesia Post-op Note  Patient: Vickie Galvan  Procedure(s) Performed: Procedure(s): LAPAROSCOPIC REPAIR MUTIPLE INCARCERATED VENTRAL HERNIAS (N/A) INSERTION OF MESH (N/A)  Patient Location: PACU  Anesthesia Type:General  Level of Consciousness: awake and alert   Airway and Oxygen Therapy: Patient Spontanous Breathing  Post-op Pain: mild  Post-op Assessment: Post-op Vital signs reviewed  Post-op Vital Signs: stable  Last Vitals:  Filed Vitals:   05/04/14 1005  BP: 136/93  Pulse: 88  Temp:   Resp: 18    Complications: No apparent anesthesia complications

## 2014-05-05 DIAGNOSIS — K436 Other and unspecified ventral hernia with obstruction, without gangrene: Secondary | ICD-10-CM | POA: Diagnosis not present

## 2014-05-05 MED ORDER — POLYETHYLENE GLYCOL 3350 17 G PO PACK
17.0000 g | PACK | Freq: Every day | ORAL | Status: DC | PRN
  Administered 2014-05-05: 17 g via ORAL
  Filled 2014-05-05 (×2): qty 1

## 2014-05-05 NOTE — Plan of Care (Signed)
Problem: Phase III Progression Outcomes Goal: Activity at appropriate level-compared to baseline (UP IN CHAIR FOR HEMODIALYSIS)  Outcome: Completed/Met Date Met:  05/05/14 Goal: Voiding independently Outcome: Completed/Met Date Met:  05/05/14

## 2014-05-05 NOTE — Progress Notes (Signed)
1 Day Post-Op  Subjective: Stable and alert. Ambulating to bathroom and voiding. No nausea. Waiting for breakfast. Pain control adequate although sore and moving slowly. She she states that she thinks she will need to stay another day and go home tomorrow.  Objective: Vital signs in last 24 hours: Temp:  [97.5 F (36.4 C)-98.5 F (36.9 C)] 98.2 F (36.8 C) (11/14 0558) Pulse Rate:  [77-107] 85 (11/14 0558) Resp:  [13-21] 19 (11/14 0558) BP: (127-151)/(85-105) 151/92 mmHg (11/14 0558) SpO2:  [90 %-100 %] 97 % (11/14 0558) Last BM Date: 05/03/14  Intake/Output from previous day: 11/13 0701 - 11/14 0700 In: 3967.3 [P.O.:480; I.V.:3387.3; IV Piggyback:100] Out: 1370 [Urine:1350; Blood:20] Intake/Output this shift:    General appearance: alert. Mild distress. Mental status normal. Husband in room. Resp: clear to auscultation bilaterally GI: abdomen soft. Appropriately tender. Mild obesity. Wounds and trocar sites look good.  Lab Results:  No results found for this or any previous visit (from the past 24 hour(s)).   Studies/Results: No results found.  . fluticasone  1 spray Each Nare Daily  . heparin  5,000 Units Subcutaneous 3 times per day  . losartan  100 mg Oral Daily   And  . hydrochlorothiazide  12.5 mg Oral Daily  . potassium chloride  20 mEq Oral Daily     Assessment/Plan: s/p Procedure(s): LAPAROSCOPIC REPAIR MUTIPLE INCARCERATED VENTRAL HERNIAS INSERTION OF MESH  POD #1. Laparoscopic ventral hernia repair mesh. Stable. Advance diet and activities Anticipate discharge tomorrow   @PROBHOSP @  LOS: 1 day    Twanda Stakes M 05/05/2014  . .prob

## 2014-05-06 DIAGNOSIS — K436 Other and unspecified ventral hernia with obstruction, without gangrene: Secondary | ICD-10-CM | POA: Diagnosis not present

## 2014-05-06 MED ORDER — OXYCODONE-ACETAMINOPHEN 5-325 MG PO TABS: 1.0000 | ORAL_TABLET | ORAL | Status: DC | PRN

## 2014-05-06 MED ORDER — POLYETHYLENE GLYCOL 3350 17 G PO PACK
17.0000 g | PACK | Freq: Once | ORAL | Status: AC
  Administered 2014-05-06: 17 g via ORAL

## 2014-05-06 NOTE — Plan of Care (Signed)
Problem: Discharge Progression Outcomes Goal: Discharge plan in place and appropriate Outcome: Completed/Met Date Met:  05/06/14 Goal: Pain controlled with appropriate interventions Outcome: Completed/Met Date Met:  05/06/14 Goal: Hemodynamically stable Outcome: Completed/Met Date Met:  05/06/14 Goal: Complications resolved/controlled Outcome: Completed/Met Date Met:  05/06/14 Goal: Tolerating diet Outcome: Completed/Met Date Met:  05/06/14 Goal: Activity appropriate for discharge plan Outcome: Completed/Met Date Met:  05/06/14 Goal: Tubes and drains discontinued if indicated Outcome: Completed/Met Date Met:  05/06/14 Goal: Staples/sutures removed Outcome: Not Applicable Date Met:  05/06/14 Goal: Steri-Strips applied Outcome: Not Applicable Date Met:  05/06/14     

## 2014-05-06 NOTE — Discharge Summary (Signed)
  Patient ID: Vickie Galvan [id number] 38 y.o. 12-26-1975  Admit date: 05/04/2014  Discharge date and time: 05/06/2014  Admitting Physician: Adin Hector  Discharge Physician: Adin Hector  Admission Diagnoses: multiple incarecerated ventral hernias  Discharge Diagnoses: multiple incarcerated ventral hernias Hypertension History cesarean section   multinodular goiter  Operations: Procedure(s): LAPAROSCOPIC REPAIR MUTIPLE INCARCERATED VENTRAL HERNIAS INSERTION OF MESH  Admission Condition: good  Discharged Condition: good  Indication for Admission: This patient returns for discussion of her symptomatic abdominal wall hernias. Her CT scan shows a small umbilical hernia and a medium-sized epigastric hernia containing fat only. There is no involvement of the intestine. Small right kidney stone is noted. I discussed all these findings with her. There does not appear to be any indication for intervention for her kidney stone. She says the hernias are somewhat uncomfortable and she is afraid to let them get larger. She asked that I would go ahead and repair this at this time. I feel that it is reasonable and I think this is a good situation for a laparoscopic approach  Hospital Course: on the day of admission the patient was taken to the operating room and underwent laparoscopic repair for ventral hernias. She had a small umbilical hernia and a small epigastric hernia. Inlay mesh was placed with suture fixation and secure strap fixation. Postoperatively she did well. She progressed in her diet and activities and felt ready to go home on postop day 2. At that time she was ambulating in the halls, tolerating regular diet, passing flatus, was not having any nausea. Abdomen is soft. Wounds look good. She was given instructions in diet and activities. She was given a prescription for hydrocodone for pain. She was asked to return to see me in the office in 3 weeks. No change in her usual  medications.  Consults: None  Significant Diagnostic Studies: none  Treatments: surgery: laparoscopic repair of ventral hernia with mesh  Disposition: Home  Patient Instructions:    Medication List    TAKE these medications        ALPRAZolam 0.25 MG tablet  Commonly known as:  XANAX  Take 1 tablet (0.25 mg total) by mouth 2 (two) times daily as needed for anxiety.     losartan-hydrochlorothiazide 100-12.5 MG per tablet  Commonly known as:  HYZAAR  Take 1 tablet by mouth daily.     mometasone 50 MCG/ACT nasal spray  Commonly known as:  NASONEX  Place 2 sprays into the nose 2 (two) times daily.     oxyCODONE-acetaminophen 5-325 MG per tablet  Commonly known as:  PERCOCET/ROXICET  Take 1-2 tablets by mouth every 4 (four) hours as needed for moderate pain.     potassium chloride 20 MEQ packet  Commonly known as:  KLOR-CON  Take 20 mEq by mouth daily.     pseudoephedrine-guaifenesin 60-600 MG per tablet  Commonly known as:  MUCINEX D  Take 1 tablet by mouth every 12 (twelve) hours as needed.        Activity: no lifting, driving, or strenuous exercise for 6 weeks Diet: low fat, low cholesterol diet Wound Care: none needed  Follow-up:  With Dr.Jazia Faraci in 3 weeks.  Signed: Edsel Petrin. Dalbert Batman, M.D., FACS General and minimally invasive surgery Breast and Colorectal Surgery  05/06/2014, 7:39 AM

## 2014-05-06 NOTE — Discharge Instructions (Signed)
-  see above 

## 2014-05-06 NOTE — Progress Notes (Signed)
Discharge instructions gone over with patient. Home medications gone over. Prescription given. Diet, activity, and incisional care gone over. Reasons to call the doctor discussed. Bowel regimen discussed. Patient verbalized understanding of instructions.

## 2014-05-07 ENCOUNTER — Encounter (HOSPITAL_COMMUNITY): Payer: Self-pay | Admitting: General Surgery

## 2014-06-07 ENCOUNTER — Other Ambulatory Visit: Payer: Self-pay | Admitting: Internal Medicine

## 2014-09-03 ENCOUNTER — Telehealth: Payer: Self-pay | Admitting: Internal Medicine

## 2014-09-03 DIAGNOSIS — F419 Anxiety disorder, unspecified: Secondary | ICD-10-CM

## 2014-09-03 NOTE — Telephone Encounter (Signed)
Pt request refill for ALPRAZolam (XANAX) 0.25 MG tablet to be send to [facility] on [address]. Pt aware Dr . Jenny Reichmann is out monday

## 2014-09-05 MED ORDER — ALPRAZOLAM 0.25 MG PO TABS
0.2500 mg | ORAL_TABLET | Freq: Two times a day (BID) | ORAL | Status: DC | PRN
Start: 2014-09-05 — End: 2014-12-11

## 2014-09-05 NOTE — Telephone Encounter (Signed)
Done hardcopy to Cherina  

## 2014-09-06 ENCOUNTER — Other Ambulatory Visit (INDEPENDENT_AMBULATORY_CARE_PROVIDER_SITE_OTHER): Payer: Self-pay | Admitting: General Surgery

## 2014-09-06 ENCOUNTER — Other Ambulatory Visit (INDEPENDENT_AMBULATORY_CARE_PROVIDER_SITE_OTHER): Payer: Self-pay | Admitting: *Deleted

## 2014-09-06 DIAGNOSIS — Z8719 Personal history of other diseases of the digestive system: Secondary | ICD-10-CM

## 2014-09-06 DIAGNOSIS — Z9889 Other specified postprocedural states: Principal | ICD-10-CM

## 2014-09-06 NOTE — Addendum Note (Signed)
Addended by: Adin Hector on: 09/06/2014 08:38 AM   Modules accepted: Orders

## 2014-09-06 NOTE — Telephone Encounter (Signed)
Rx done. 

## 2014-09-12 ENCOUNTER — Ambulatory Visit
Admission: RE | Admit: 2014-09-12 | Discharge: 2014-09-12 | Disposition: A | Discharge status: Home / Self Care | Payer: BLUE CROSS/BLUE SHIELD | Source: Ambulatory Visit | Attending: General Surgery | Admitting: General Surgery

## 2014-09-12 MED ORDER — IOPAMIDOL (ISOVUE-300) INJECTION 61%
100.0000 mL | Freq: Once | INTRAVENOUS | Status: AC | PRN
  Administered 2014-09-12: 100 mL via INTRAVENOUS

## 2014-10-04 ENCOUNTER — Other Ambulatory Visit (INDEPENDENT_AMBULATORY_CARE_PROVIDER_SITE_OTHER): Payer: Self-pay | Admitting: General Surgery

## 2014-10-04 DIAGNOSIS — S301XXA Contusion of abdominal wall, initial encounter: Secondary | ICD-10-CM

## 2014-10-04 NOTE — Addendum Note (Signed)
Addended by: Adin Hector on: 10/04/2014 12:47 PM   Modules accepted: Orders

## 2014-10-05 ENCOUNTER — Other Ambulatory Visit: Payer: Self-pay | Admitting: *Deleted

## 2014-10-05 DIAGNOSIS — S301XXA Contusion of abdominal wall, initial encounter: Secondary | ICD-10-CM

## 2014-10-08 ENCOUNTER — Other Ambulatory Visit: Payer: BLUE CROSS/BLUE SHIELD

## 2014-10-12 ENCOUNTER — Ambulatory Visit
Admission: RE | Admit: 2014-10-12 | Discharge: 2014-10-12 | Disposition: A | Discharge status: Home / Self Care | Payer: BLUE CROSS/BLUE SHIELD | Source: Ambulatory Visit | Attending: General Surgery | Admitting: General Surgery

## 2014-12-10 ENCOUNTER — Telehealth: Payer: Self-pay | Admitting: Internal Medicine

## 2014-12-10 DIAGNOSIS — F419 Anxiety disorder, unspecified: Secondary | ICD-10-CM

## 2014-12-10 NOTE — Telephone Encounter (Signed)
Patient is requesting a refill of ALPRAZolam (XANAX) 0.25 MG tablet [[id number]] sent to [facility] on [address]. i did advise she may need a follow up visit

## 2014-12-10 NOTE — Telephone Encounter (Signed)
Patient has a appointment set 12/25/14 but will be out of meds in a few days. Thanks

## 2014-12-11 MED ORDER — ALPRAZOLAM 0.25 MG PO TABS: 0.2500 mg | ORAL_TABLET | Freq: Two times a day (BID) | ORAL | Status: DC | PRN

## 2014-12-11 NOTE — Telephone Encounter (Signed)
Rx faxed to pharmacy  

## 2014-12-11 NOTE — Telephone Encounter (Signed)
See below

## 2014-12-11 NOTE — Telephone Encounter (Signed)
Done hardcopy to Dahlia  

## 2014-12-25 ENCOUNTER — Ambulatory Visit (INDEPENDENT_AMBULATORY_CARE_PROVIDER_SITE_OTHER): Payer: BLUE CROSS/BLUE SHIELD | Admitting: Internal Medicine

## 2014-12-25 ENCOUNTER — Encounter: Payer: Self-pay | Admitting: Internal Medicine

## 2014-12-25 VITALS — BP 144/90 | HR 108 | Temp 98.7°F | Wt 163.0 lb

## 2014-12-25 DIAGNOSIS — I10 Essential (primary) hypertension: Secondary | ICD-10-CM | POA: Diagnosis not present

## 2014-12-25 DIAGNOSIS — Z0000 Encounter for general adult medical examination without abnormal findings: Secondary | ICD-10-CM | POA: Diagnosis not present

## 2014-12-25 NOTE — Progress Notes (Signed)
Pre visit review using our clinic review tool, if applicable. No additional management support is needed unless otherwise documented below in the visit note. 

## 2014-12-25 NOTE — Progress Notes (Signed)
Subjective:    Patient ID: Vickie Galvan, female    DOB: [birth year], 39 y.o.   MRN: [id number]  HPI  Here for wellness and f/u;  Overall doing ok;  Pt denies Chest pain, worsening SOB, DOE, wheezing, orthopnea, PND, worsening LE edema, palpitations, dizziness or syncope.  Pt denies neurological change such as new headache, facial or extremity weakness.  Pt denies polydipsia, polyuria, or low sugar symptoms. Pt states overall good compliance with treatment and medications, good tolerability, and has been trying to follow appropriate diet.  Pt denies worsening depressive symptoms, suicidal ideation or panic. No fever, night sweats, wt loss, loss of appetite, or other constitutional symptoms.  Pt states good ability with ADL's, has low fall risk, home safety reviewed and adequate, no other significant changes in hearing or vision, and only occasionally active with exercise.  Denies hyper or hypo thyroid symptoms such as voice, skin or hair change. - sees endo for thyroid concerns. No current complaints except has some residual ? Ventral hernia like swelling above the umbilicus Past Medical History  Diagnosis Date  . Anxiety   . Environmental allergies   . ANEMIA, IRON DEFICIENCY   . GERD   . GOITER, MULTINODULAR   . HYPERTENSION   . CONSTIPATION    Past Surgical History  Procedure Laterality Date  . Tubal ligation    . Cesarean section      X 2  . Colonoscopy    . Ventral hernia repair N/A 05/04/2014    Procedure: LAPAROSCOPIC REPAIR MUTIPLE INCARCERATED VENTRAL HERNIAS;  Surgeon: Fanny Skates, MD;  Location: [facility];  Service: General;  Laterality: N/A;  . Insertion of mesh N/A 05/04/2014    Procedure: INSERTION OF MESH;  Surgeon: Fanny Skates, MD;  Location: [facility];  Service: General;  Laterality: N/A;    reports that she has never smoked. She does not have any smokeless tobacco history on file. She reports that she drinks alcohol. She reports that she does not use illicit  drugs. family history includes Diabetes in her maternal grandfather; Hypertension in her maternal grandfather and maternal grandmother; Stroke in her maternal grandfather. Allergies  Allergen Reactions  . Azithromycin Other (See Comments)    abd pain   Current Outpatient Prescriptions on File Prior to Visit  Medication Sig Dispense Refill  . ALPRAZolam (XANAX) 0.25 MG tablet Take 1 tablet (0.25 mg total) by mouth 2 (two) times daily as needed for anxiety. 60 tablet 0  . losartan-hydrochlorothiazide (HYZAAR) 100-12.5 MG per tablet Take 1 tablet by mouth daily.    Marland Kitchen losartan-hydrochlorothiazide (HYZAAR) 100-12.5 MG per tablet TAKE 1 TABLET BY MOUTH DAILY. 30 tablet 8  . potassium chloride (K-DUR) 10 MEQ tablet TAKE 1 TABLET (10 MEQ TOTAL) BY MOUTH DAILY. 30 tablet 8  . pseudoephedrine-guaifenesin (MUCINEX D) 60-600 MG per tablet Take 1 tablet by mouth every 12 (twelve) hours as needed.     . mometasone (NASONEX) 50 MCG/ACT nasal spray Place 2 sprays into the nose 2 (two) times daily.    Marland Kitchen oxyCODONE-acetaminophen (PERCOCET/ROXICET) 5-325 MG per tablet Take 1-2 tablets by mouth every 4 (four) hours as needed for moderate pain. (Patient not taking: Reported on 12/25/2014) 30 tablet 0   No current facility-administered medications on file prior to visit.   Review of Systems Constitutional: Negative for increased diaphoresis, other activity, appetite or siginficant weight change other than noted HENT: Negative for worsening hearing loss, ear pain, facial swelling, mouth sores and neck stiffness.   Eyes: Negative  for other worsening pain, redness or visual disturbance.  Respiratory: Negative for shortness of breath and wheezing  Cardiovascular: Negative for chest pain and palpitations.  Gastrointestinal: Negative for diarrhea, blood in stool, abdominal distention or other pain Genitourinary: Negative for hematuria, flank pain or change in urine volume.  Musculoskeletal: Negative for myalgias or  other joint complaints.  Skin: Negative for color change and wound or drainage.  Neurological: Negative for syncope and numbness. other than noted Hematological: Negative for adenopathy. or other swelling Psychiatric/Behavioral: Negative for hallucinations, SI, self-injury, decreased concentration or other worsening agitation.      Objective:   Physical Exam BP 144/90 mmHg  Pulse 108  Temp(Src) 98.7 F (37.1 C) (Oral)  Wt 163 lb (73.936 kg)  SpO2 96%  LMP 12/10/2014 VS noted,  Constitutional: Pt is oriented to person, place, and time. Appears well-developed and well-nourished, in no significant distress Head: Normocephalic and atraumatic.  Right Ear: External ear normal.  Left Ear: External ear normal.  Nose: Nose normal.  Mouth/Throat: Oropharynx is clear and moist.  Eyes: Conjunctivae and EOM are normal. Pupils are equal, round, and reactive to light.  Neck: Normal range of motion. Neck supple. No JVD present. No tracheal deviation present or significant neck LA or mass Cardiovascular: Normal rate, regular rhythm, normal heart sounds and intact distal pulses.   Pulmonary/Chest: Effort normal and breath sounds without rales or wheezing  Abdominal: Soft. Bowel sounds are normal. NT. No HSM  Musculoskeletal: Normal range of motion. Exhibits no edema.  Lymphadenopathy:  Has no cervical adenopathy.  Neurological: Pt is alert and oriented to person, place, and time. Pt has normal reflexes. No cranial nerve deficit. Motor grossly intact Skin: Skin is warm and dry. No rash noted.  Psychiatric:  Has mild nervous, somewhat dysphoric mood and affect. Behavior is normal.      Assessment & Plan:

## 2014-12-25 NOTE — Patient Instructions (Signed)
Please continue all other medications as before, and refills have been done if requested.  Please have the pharmacy call with any other refills you may need.  Please continue your efforts at being more active, low cholesterol diet, and weight control.  You are otherwise up to date with prevention measures today.  Please keep your appointments with your specialists as you may have planned  Please go to the LAB in the Basement (turn left off the elevator) for the tests to be done at your convenience  You will be contacted by phone if any changes need to be made immediately.  Otherwise, you will receive a letter about your results with an explanation, but please check with MyChart first.  Please remember to sign up for MyChart if you have not done so, as this will be important to you in the future with finding out test results, communicating by private email, and scheduling acute appointments online when needed.  Please return in 1 year for your yearly visit, or sooner if needed  

## 2014-12-27 NOTE — Assessment & Plan Note (Signed)

## 2014-12-27 NOTE — Assessment & Plan Note (Signed)
stable overall by history and exam, recent data reviewed with pt, and pt to continue medical treatment as before,  to f/u any worsening symptoms or concerns BP Readings from Last 3 Encounters:  12/25/14 144/90  05/06/14 140/90  04/26/14 148/90

## 2015-02-13 ENCOUNTER — Telehealth: Payer: Self-pay | Admitting: Internal Medicine

## 2015-02-13 DIAGNOSIS — K429 Umbilical hernia without obstruction or gangrene: Secondary | ICD-10-CM

## 2015-02-13 NOTE — Telephone Encounter (Signed)
Referral done

## 2015-02-13 NOTE — Telephone Encounter (Signed)
Pt called stating that she has developed a "lump" near her umbilical hernia repair site. She followed up with general surgeon who advised that her best surgical option would be cosmetic. Pt is requesting a referral to a cosmetic surgeon for evaluation, please advise

## 2015-02-13 NOTE — Telephone Encounter (Signed)
Patient ask you to call her. Didn't give me reason

## 2015-02-26 ENCOUNTER — Telehealth: Payer: Self-pay | Admitting: *Deleted

## 2015-02-26 DIAGNOSIS — F419 Anxiety disorder, unspecified: Secondary | ICD-10-CM

## 2015-02-26 NOTE — Telephone Encounter (Signed)
Requesting refills on her xanax...Johny Chess

## 2015-02-27 MED ORDER — ALPRAZOLAM 0.25 MG PO TABS
0.2500 mg | ORAL_TABLET | Freq: Two times a day (BID) | ORAL | Status: DC | PRN
Start: 2015-02-27 — End: 2015-06-03

## 2015-02-27 NOTE — Telephone Encounter (Signed)
Called pt no answer LMOM rx fax to [facility].../lmb 

## 2015-02-27 NOTE — Telephone Encounter (Signed)
Pls advise on msg below.../lmb 

## 2015-02-27 NOTE — Telephone Encounter (Signed)
Done hardcopy to Dahlia  

## 2015-04-29 ENCOUNTER — Ambulatory Visit: Payer: Self-pay | Admitting: Specialist

## 2015-04-29 ENCOUNTER — Other Ambulatory Visit: Payer: Self-pay | Admitting: Specialist

## 2015-05-01 ENCOUNTER — Encounter (HOSPITAL_BASED_OUTPATIENT_CLINIC_OR_DEPARTMENT_OTHER): Payer: Self-pay | Admitting: *Deleted

## 2015-05-02 ENCOUNTER — Ambulatory Visit: Payer: Self-pay | Admitting: Specialist

## 2015-05-02 ENCOUNTER — Encounter (HOSPITAL_BASED_OUTPATIENT_CLINIC_OR_DEPARTMENT_OTHER)
Admission: RE | Admit: 2015-05-02 | Discharge: 2015-05-02 | Disposition: A | Discharge status: Home / Self Care | Payer: BLUE CROSS/BLUE SHIELD | Source: Ambulatory Visit | Attending: Specialist | Admitting: Specialist

## 2015-05-02 DIAGNOSIS — Z79899 Other long term (current) drug therapy: Secondary | ICD-10-CM | POA: Diagnosis not present

## 2015-05-02 DIAGNOSIS — Z881 Allergy status to other antibiotic agents status: Secondary | ICD-10-CM | POA: Diagnosis not present

## 2015-05-02 DIAGNOSIS — I10 Essential (primary) hypertension: Secondary | ICD-10-CM | POA: Diagnosis not present

## 2015-05-02 DIAGNOSIS — K219 Gastro-esophageal reflux disease without esophagitis: Secondary | ICD-10-CM | POA: Diagnosis not present

## 2015-05-02 DIAGNOSIS — Z9851 Tubal ligation status: Secondary | ICD-10-CM | POA: Diagnosis not present

## 2015-05-02 DIAGNOSIS — K436 Other and unspecified ventral hernia with obstruction, without gangrene: Secondary | ICD-10-CM | POA: Diagnosis not present

## 2015-05-02 DIAGNOSIS — F419 Anxiety disorder, unspecified: Secondary | ICD-10-CM | POA: Diagnosis not present

## 2015-05-02 LAB — BASIC METABOLIC PANEL
Anion gap: 7 (ref 5–15)
BUN: 9 mg/dL (ref 6–20)
CALCIUM: 9 mg/dL (ref 8.9–10.3)
CHLORIDE: 101 mmol/L (ref 101–111)
CO2: 28 mmol/L (ref 22–32)
CREATININE: 0.93 mg/dL (ref 0.44–1.00)
GFR calc Af Amer: >60 mL/min (ref >60)
GFR calc non Af Amer: >60 mL/min (ref >60)
GLUCOSE: 110 mg/dL — AB (ref 65–99)
Potassium: 3.7 mmol/L (ref 3.5–5.1)
SODIUM: 136 mmol/L (ref 135–145)

## 2015-05-02 LAB — CBC
HCT: 33 % — ABNORMAL LOW (ref 36.0–46.0)
Hemoglobin: 10.2 g/dL — ABNORMAL LOW (ref 12.0–15.0)
MCH: 26.5 pg (ref 26.0–34.0)
MCHC: 30.9 g/dL (ref 30.0–36.0)
MCV: 85.7 fL (ref 78.0–100.0)
PLATELETS: 441 10*3/uL — AB (ref 150–400)
RBC: 3.85 MIL/uL — ABNORMAL LOW (ref 3.87–5.11)
RDW: 16.4 % — ABNORMAL HIGH (ref 11.5–15.5)
WBC: 5.3 10*3/uL (ref 4.0–10.5)

## 2015-05-02 NOTE — Pre-Procedure Instructions (Signed)
Dr. [name] aware of Hgb of 10.2; pt. OK for surgery.

## 2015-05-02 NOTE — Progress Notes (Signed)
Called Dr [name] office, left a message with Maudie Mercury and faxed lab abnormal values on pt. To office.

## 2015-05-06 ENCOUNTER — Encounter (HOSPITAL_BASED_OUTPATIENT_CLINIC_OR_DEPARTMENT_OTHER)
Admission: RE | Disposition: A | Discharge status: Home / Self Care | Payer: Self-pay | Source: Ambulatory Visit | Attending: Specialist

## 2015-05-06 ENCOUNTER — Ambulatory Visit (HOSPITAL_BASED_OUTPATIENT_CLINIC_OR_DEPARTMENT_OTHER): Payer: BLUE CROSS/BLUE SHIELD | Admitting: Anesthesiology

## 2015-05-06 ENCOUNTER — Ambulatory Visit (HOSPITAL_BASED_OUTPATIENT_CLINIC_OR_DEPARTMENT_OTHER)
Admission: RE | Admit: 2015-05-06 | Discharge: 2015-05-06 | Disposition: A | Discharge status: Home / Self Care | Payer: BLUE CROSS/BLUE SHIELD | Source: Ambulatory Visit | Attending: Specialist | Admitting: Specialist

## 2015-05-06 ENCOUNTER — Encounter (HOSPITAL_BASED_OUTPATIENT_CLINIC_OR_DEPARTMENT_OTHER): Payer: Self-pay | Admitting: *Deleted

## 2015-05-06 DIAGNOSIS — K436 Other and unspecified ventral hernia with obstruction, without gangrene: Secondary | ICD-10-CM | POA: Insufficient documentation

## 2015-05-06 DIAGNOSIS — Z9851 Tubal ligation status: Secondary | ICD-10-CM | POA: Insufficient documentation

## 2015-05-06 DIAGNOSIS — F419 Anxiety disorder, unspecified: Secondary | ICD-10-CM | POA: Insufficient documentation

## 2015-05-06 DIAGNOSIS — I10 Essential (primary) hypertension: Secondary | ICD-10-CM | POA: Insufficient documentation

## 2015-05-06 DIAGNOSIS — Z79899 Other long term (current) drug therapy: Secondary | ICD-10-CM | POA: Insufficient documentation

## 2015-05-06 DIAGNOSIS — Z881 Allergy status to other antibiotic agents status: Secondary | ICD-10-CM | POA: Insufficient documentation

## 2015-05-06 DIAGNOSIS — K219 Gastro-esophageal reflux disease without esophagitis: Secondary | ICD-10-CM | POA: Insufficient documentation

## 2015-05-06 HISTORY — PX: VENTRAL HERNIA REPAIR: SHX424

## 2015-05-06 HISTORY — PX: INSERTION OF MESH: SHX5868

## 2015-05-06 SURGERY — REPAIR, HERNIA, VENTRAL
Anesthesia: General | Site: Abdomen

## 2015-05-06 MED ORDER — GLYCOPYRROLATE 0.2 MG/ML IJ SOLN
INTRAMUSCULAR | Status: AC
  Filled 2015-05-06: qty 2

## 2015-05-06 MED ORDER — HYDROMORPHONE HCL 1 MG/ML IJ SOLN
INTRAMUSCULAR | Status: AC
  Filled 2015-05-06: qty 1

## 2015-05-06 MED ORDER — LIDOCAINE HCL (CARDIAC) 20 MG/ML IV SOLN
INTRAVENOUS | Status: AC
  Filled 2015-05-06: qty 5

## 2015-05-06 MED ORDER — PROMETHAZINE HCL 25 MG/ML IJ SOLN: 6.2500 mg | INTRAMUSCULAR | Status: DC | PRN

## 2015-05-06 MED ORDER — DEXAMETHASONE SODIUM PHOSPHATE 10 MG/ML IJ SOLN
INTRAMUSCULAR | Status: AC
  Filled 2015-05-06: qty 1

## 2015-05-06 MED ORDER — LACTATED RINGERS IV SOLN
INTRAVENOUS | Status: DC
  Administered 2015-05-06 (×2): via INTRAVENOUS

## 2015-05-06 MED ORDER — CEFAZOLIN SODIUM-DEXTROSE 2-3 GM-% IV SOLR
2.0000 g | INTRAVENOUS | Status: AC
  Administered 2015-05-06: 2 g via INTRAVENOUS

## 2015-05-06 MED ORDER — FENTANYL CITRATE (PF) 100 MCG/2ML IJ SOLN
INTRAMUSCULAR | Status: AC
  Filled 2015-05-06: qty 4

## 2015-05-06 MED ORDER — PROPOFOL 10 MG/ML IV BOLUS
INTRAVENOUS | Status: AC
Start: 2015-05-06 — End: 2015-05-06
  Filled 2015-05-06: qty 20

## 2015-05-06 MED ORDER — HYDROMORPHONE HCL 1 MG/ML IJ SOLN
0.5000 mg | INTRAMUSCULAR | Status: DC | PRN
  Administered 2015-05-06: 0.5 mg via INTRAVENOUS

## 2015-05-06 MED ORDER — ARTIFICIAL TEARS OP OINT
TOPICAL_OINTMENT | OPHTHALMIC | Status: AC
  Filled 2015-05-06: qty 3.5

## 2015-05-06 MED ORDER — ONDANSETRON HCL 4 MG/2ML IJ SOLN
INTRAMUSCULAR | Status: AC
  Filled 2015-05-06: qty 2

## 2015-05-06 MED ORDER — KETOROLAC TROMETHAMINE 30 MG/ML IJ SOLN
30.0000 mg | Freq: Once | INTRAMUSCULAR | Status: DC
Start: 2015-05-06 — End: 2015-05-06

## 2015-05-06 MED ORDER — SCOPOLAMINE 1 MG/3DAYS TD PT72: 1.0000 | MEDICATED_PATCH | Freq: Once | TRANSDERMAL | Status: DC | PRN

## 2015-05-06 MED ORDER — CEFAZOLIN SODIUM-DEXTROSE 2-3 GM-% IV SOLR
INTRAVENOUS | Status: AC
  Filled 2015-05-06: qty 50

## 2015-05-06 MED ORDER — SODIUM CHLORIDE 0.9 % IV SOLN
INTRAVENOUS | Status: DC | PRN
  Administered 2015-05-06: 150 mL via INTRAMUSCULAR

## 2015-05-06 MED ORDER — NEOSTIGMINE METHYLSULFATE 10 MG/10ML IV SOLN
INTRAVENOUS | Status: AC
  Filled 2015-05-06: qty 1

## 2015-05-06 MED ORDER — OXYCODONE HCL 5 MG/5ML PO SOLN: 5.0000 mg | Freq: Once | ORAL | Status: AC | PRN

## 2015-05-06 MED ORDER — LIDOCAINE-EPINEPHRINE 0.5 %-1:200000 IJ SOLN
INTRAMUSCULAR | Status: AC
  Filled 2015-05-06: qty 2

## 2015-05-06 MED ORDER — FENTANYL CITRATE (PF) 100 MCG/2ML IJ SOLN
50.0000 ug | INTRAMUSCULAR | Status: AC | PRN
  Administered 2015-05-06 (×3): 25 ug via INTRAVENOUS
  Administered 2015-05-06: 100 ug via INTRAVENOUS
  Administered 2015-05-06: 25 ug via INTRAVENOUS

## 2015-05-06 MED ORDER — MIDAZOLAM HCL 2 MG/2ML IJ SOLN
INTRAMUSCULAR | Status: AC
  Filled 2015-05-06: qty 4

## 2015-05-06 MED ORDER — ONDANSETRON HCL 4 MG/2ML IJ SOLN
INTRAMUSCULAR | Status: DC | PRN
  Administered 2015-05-06: 4 mg via INTRAVENOUS

## 2015-05-06 MED ORDER — LIDOCAINE HCL (CARDIAC) 10 MG/ML IV SOLN
INTRAVENOUS | Status: DC | PRN
  Administered 2015-05-06: 60 mg via INTRAVENOUS

## 2015-05-06 MED ORDER — GLYCOPYRROLATE 0.2 MG/ML IJ SOLN
0.2000 mg | Freq: Once | INTRAMUSCULAR | Status: AC | PRN
  Administered 2015-05-06: 0.4 mg via INTRAVENOUS

## 2015-05-06 MED ORDER — MIDAZOLAM HCL 2 MG/2ML IJ SOLN
1.0000 mg | INTRAMUSCULAR | Status: DC | PRN
  Administered 2015-05-06: 2 mg via INTRAVENOUS

## 2015-05-06 MED ORDER — OXYCODONE HCL 5 MG PO TABS
ORAL_TABLET | ORAL | Status: AC
  Filled 2015-05-06: qty 1

## 2015-05-06 MED ORDER — PROPOFOL 10 MG/ML IV BOLUS
INTRAVENOUS | Status: DC | PRN
  Administered 2015-05-06: 150 mg via INTRAVENOUS

## 2015-05-06 MED ORDER — OXYCODONE HCL 5 MG PO TABS
5.0000 mg | ORAL_TABLET | Freq: Once | ORAL | Status: AC | PRN
  Administered 2015-05-06: 5 mg via ORAL

## 2015-05-06 MED ORDER — PHENYLEPHRINE HCL 10 MG/ML IJ SOLN
INTRAMUSCULAR | Status: DC | PRN
  Administered 2015-05-06: 80 ug via INTRAVENOUS

## 2015-05-06 MED ORDER — NEOSTIGMINE METHYLSULFATE 10 MG/10ML IV SOLN
INTRAVENOUS | Status: DC | PRN
  Administered 2015-05-06: 3 mg via INTRAVENOUS

## 2015-05-06 MED ORDER — HYDROMORPHONE HCL 1 MG/ML IJ SOLN
0.2500 mg | INTRAMUSCULAR | Status: DC | PRN
  Administered 2015-05-06 (×4): 0.5 mg via INTRAVENOUS

## 2015-05-06 MED ORDER — SUCCINYLCHOLINE CHLORIDE 20 MG/ML IJ SOLN
INTRAMUSCULAR | Status: AC
  Filled 2015-05-06: qty 1

## 2015-05-06 MED ORDER — DEXAMETHASONE SODIUM PHOSPHATE 4 MG/ML IJ SOLN
INTRAMUSCULAR | Status: DC | PRN
  Administered 2015-05-06: 10 mg via INTRAVENOUS

## 2015-05-06 MED ORDER — SUCCINYLCHOLINE CHLORIDE 20 MG/ML IJ SOLN
INTRAMUSCULAR | Status: DC | PRN
  Administered 2015-05-06: 100 mg via INTRAVENOUS

## 2015-05-06 MED ORDER — ROCURONIUM BROMIDE 100 MG/10ML IV SOLN
INTRAVENOUS | Status: DC | PRN
  Administered 2015-05-06: 20 mg via INTRAVENOUS

## 2015-05-06 SURGICAL SUPPLY — 69 items
APPLICATOR COTTON TIP 6IN STRL (MISCELLANEOUS) ×4 IMPLANT
BAG DECANTER FOR FLEXI CONT (MISCELLANEOUS) IMPLANT
BENZOIN TINCTURE PRP APPL 2/3 (GAUZE/BANDAGES/DRESSINGS) IMPLANT
BINDER ABDOMINAL 10 UNV 27-48 (MISCELLANEOUS) ×4 IMPLANT
BINDER ABDOMINAL 12 SM 30-45 (SOFTGOODS) IMPLANT
BLADE HEX COATED 2.75 (ELECTRODE) ×4 IMPLANT
BLADE KNIFE PERSONA 10 (BLADE) ×4 IMPLANT
BLADE KNIFE PERSONA 15 (BLADE) ×4 IMPLANT
CANISTER SUCT 1200ML W/VALVE (MISCELLANEOUS) ×4 IMPLANT
COTTONBALL LRG STERILE PKG (GAUZE/BANDAGES/DRESSINGS) IMPLANT
COVER BACK TABLE 60X90IN (DRAPES) ×4 IMPLANT
COVER MAYO STAND STRL (DRAPES) ×4 IMPLANT
DRAIN CHANNEL 10F 3/8 F FF (DRAIN) ×4 IMPLANT
DRAPE LAPAROSCOPIC ABDOMINAL (DRAPES) ×4 IMPLANT
DRSG PAD ABDOMINAL 8X10 ST (GAUZE/BANDAGES/DRESSINGS) IMPLANT
ELECT BLADE 6.5 .24CM SHAFT (ELECTRODE) IMPLANT
ELECT REM PT RETURN 9FT ADLT (ELECTROSURGICAL) ×4
ELECTRODE REM PT RTRN 9FT ADLT (ELECTROSURGICAL) ×2 IMPLANT
EVACUATOR 3/16  PVC DRAIN (DRAIN)
EVACUATOR 3/16 PVC DRAIN (DRAIN) IMPLANT
EVACUATOR SILICONE 100CC (DRAIN) ×4 IMPLANT
FILTER LIPOSUCTION (MISCELLANEOUS) IMPLANT
GAUZE SPONGE 4X4 12PLY STRL (GAUZE/BANDAGES/DRESSINGS) ×4 IMPLANT
GAUZE XEROFORM 5X9 LF (GAUZE/BANDAGES/DRESSINGS) ×4 IMPLANT
GLOVE ECLIPSE 7.0 STRL STRAW (GLOVE) ×4 IMPLANT
GOWN STRL REUS W/ TWL LRG LVL3 (GOWN DISPOSABLE) IMPLANT
GOWN STRL REUS W/ TWL XL LVL3 (GOWN DISPOSABLE) ×2 IMPLANT
GOWN STRL REUS W/TWL LRG LVL3 (GOWN DISPOSABLE)
GOWN STRL REUS W/TWL XL LVL3 (GOWN DISPOSABLE) ×2
IV LACTATED RINGERS 1000ML (IV SOLUTION) IMPLANT
IV NS 500ML (IV SOLUTION)
IV NS 500ML BAXH (IV SOLUTION) IMPLANT
LINER CANISTER 1000CC FLEX (MISCELLANEOUS) IMPLANT
MESH ULTRAPRO 6X6 15CM15CM (Mesh General) ×4 IMPLANT
NDL SAFETY ECLIPSE 18X1.5 (NEEDLE) IMPLANT
NEEDLE HYPO 18GX1.5 SHARP (NEEDLE)
NEEDLE SPNL 18GX3.5 QUINCKE PK (NEEDLE) IMPLANT
NS IRRIG 1000ML POUR BTL (IV SOLUTION) IMPLANT
PACK BASIN DAY SURGERY FS (CUSTOM PROCEDURE TRAY) ×4 IMPLANT
PEN SKIN MARKING BROAD TIP (MISCELLANEOUS) ×4 IMPLANT
PIN SAFETY STERILE (MISCELLANEOUS) ×4 IMPLANT
SHEET MEDIUM DRAPE 40X70 STRL (DRAPES) IMPLANT
SHEETING SILICONE GEL EPI DERM (MISCELLANEOUS) ×4 IMPLANT
SLEEVE SCD COMPRESS KNEE MED (MISCELLANEOUS) ×4 IMPLANT
SPONGE LAP 18X18 X RAY DECT (DISPOSABLE) ×4 IMPLANT
STAPLER VISISTAT 35W (STAPLE) IMPLANT
STRIP SUTURE WOUND CLOSURE 1/2 (SUTURE) IMPLANT
SUT MNCRL AB 3-0 PS2 18 (SUTURE) IMPLANT
SUT MON AB 2-0 CT1 36 (SUTURE) IMPLANT
SUT MON AB 5-0 PS2 18 (SUTURE) IMPLANT
SUT NOVA NAB GS-22 2 0 T19 (SUTURE) ×12 IMPLANT
SUT PDS AB 0 CT 36 (SUTURE) IMPLANT
SUT PROLENE 1 CT (SUTURE) IMPLANT
SUT PROLENE 4 0 PS 2 18 (SUTURE) IMPLANT
SUT SILK 3 0 TIES 17X18 (SUTURE) ×2
SUT SILK 3-0 18XBRD TIE BLK (SUTURE) ×2 IMPLANT
SYR 50ML LL SCALE MARK (SYRINGE) IMPLANT
SYR 5ML LL (SYRINGE) IMPLANT
SYR CONTROL 10ML LL (SYRINGE) IMPLANT
TAPE HYPAFIX 6 X30' (GAUZE/BANDAGES/DRESSINGS) ×1
TAPE HYPAFIX 6X30 (GAUZE/BANDAGES/DRESSINGS) ×3 IMPLANT
TOWEL OR 17X24 6PK STRL BLUE (TOWEL DISPOSABLE) ×8 IMPLANT
TRAY FOLEY CATH SILVER 16FR (SET/KITS/TRAYS/PACK) IMPLANT
TUBE CONNECTING 20'X1/4 (TUBING) ×1
TUBE CONNECTING 20X1/4 (TUBING) ×3 IMPLANT
TUBING SET GRADUATE ASPIR 12FT (MISCELLANEOUS) IMPLANT
UNDERPAD 30X30 (UNDERPADS AND DIAPERS) ×8 IMPLANT
VAC PENCILS W/TUBING CLEAR (MISCELLANEOUS) ×4 IMPLANT
YANKAUER SUCT BULB TIP NO VENT (SUCTIONS) ×4 IMPLANT

## 2015-05-06 NOTE — Transfer of Care (Signed)
Immediate Anesthesia Transfer of Care Note  Patient: Vickie Galvan  Procedure(s) Performed: Procedure(s): EXPLORATION OF ABDOMIN, REPAIR RECURRENT VENTRAL HERNIA AND SEVERE DIASTASIS RECTI (N/A)  Patient Location: PACU  Anesthesia Type:General  Level of Consciousness: awake, sedated and patient cooperative  Airway & Oxygen Therapy: Patient Spontanous Breathing and Patient connected to face mask oxygen  Post-op Assessment: Report given to RN and Post -op Vital signs reviewed and stable  Post vital signs: Reviewed and stable  Last Vitals:  Filed Vitals:   05/06/15 0633  BP: 164/104  Pulse: 79  Temp: 36.8 C  Resp: 18    Complications: No apparent anesthesia complications

## 2015-05-06 NOTE — Anesthesia Postprocedure Evaluation (Signed)
  Anesthesia Post-op Note  Patient: Vickie Galvan  Procedure(s) Performed: Procedure(s) with comments: EXPLORATION OF ABDOMIN, REPAIR RECURRENT VENTRAL HERNIA AND SEVERE DIASTASIS RECTI (N/A) INSERTION OF MESH (N/A) - umbiical  Patient Location: PACU  Anesthesia Type: General   Level of Consciousness: awake, alert  and oriented  Airway and Oxygen Therapy: Patient Spontanous Breathing  Post-op Pain: mild  Post-op Assessment: Post-op Vital signs reviewed  Post-op Vital Signs: Reviewed  Last Vitals:  Filed Vitals:   05/06/15 1156  BP:   Pulse: 83  Temp: 36.9 C  Resp:     Complications: No apparent anesthesia complications

## 2015-05-06 NOTE — Anesthesia Procedure Notes (Signed)
Procedure Name: Intubation Date/Time: 05/06/2015 7:42 AM Performed by: Lyndee Leo Pre-anesthesia Checklist: Patient identified, Emergency Drugs available, Suction available and Patient being monitored Patient Re-evaluated:Patient Re-evaluated prior to inductionOxygen Delivery Method: Circle System Utilized Preoxygenation: Pre-oxygenation with 100% oxygen Intubation Type: IV induction Ventilation: Mask ventilation without difficulty Laryngoscope Size: Miller and 2 Grade View: Grade II Tube type: Oral Tube size: 7.0 mm Number of attempts: 1 Airway Equipment and Method: Stylet and Oral airway Placement Confirmation: ETT inserted through vocal cords under direct vision,  positive ETCO2 and breath sounds checked- equal and bilateral Secured at: 21 cm Tube secured with: Tape Dental Injury: Teeth and Oropharynx as per pre-operative assessment

## 2015-05-06 NOTE — Brief Op Note (Signed)
05/06/2015  8:57 AM  PATIENT:  Vickie Galvan  39 y.o. female  PRE-OPERATIVE DIAGNOSIS:  RECURRENT VENTRAL HERNIA, SEVERE DIASTASIS RECTI  POST-OPERATIVE DIAGNOSIS:  RECURRENT VENTRAL HERNIA, SEVERE DIASTASIS RECTI  PROCEDURE:  Procedure(s): EXPLORATION OF ABDOMIN, REPAIR RECURRENT VENTRAL HERNIA AND SEVERE DIASTASIS RECTI (N/A)  SURGEON:  Surgeon(s) and Role:    * Cristine Polio, MD - Primary  PHYSICIAN ASSISTANT:   ASSISTANTS: none   ANESTHESIA:   general  EBL:     BLOOD ADMINISTERED:none  DRAINS: (rr) Jackson-Pratt drain(s) with closed bulb suction in the right lateral abdominal area   LOCAL MEDICATIONS USED:  LIDOCAINE   SPECIMEN:  Excision  DISPOSITION OF SPECIMEN:  PATHOLOGY  COUNTS:  YES  TOURNIQUET:  * No tourniquets in log *  DICTATION: .Other Dictation: Dictation Number [id number]  PLAN OF CARE: Discharge to home after PACU  PATIENT DISPOSITION:  PACU - hemodynamically stable.   Delay start of Pharmacological VTE agent (>24hrs) due to surgical blood loss or risk of bleeding: yes

## 2015-05-06 NOTE — Anesthesia Preprocedure Evaluation (Addendum)
Anesthesia Evaluation  Patient identified by MRN, date of birth, ID band Patient awake    Reviewed: Allergy & Precautions, NPO status , Patient's Chart, lab work & pertinent test results  Airway Mallampati: II  TM Distance: >3 FB Neck ROM: Full    Dental  (+) Teeth Intact, Dental Advisory Given   Pulmonary neg pulmonary ROS,    breath sounds clear to auscultation       Cardiovascular hypertension, Pt. on medications  Rhythm:Regular Rate:Normal     Neuro/Psych Anxiety negative neurological ROS     GI/Hepatic Neg liver ROS, GERD  ,  Endo/Other  negative endocrine ROS  Renal/GU negative Renal ROS     Musculoskeletal   Abdominal   Peds  Hematology  (+) anemia ,   Anesthesia Other Findings   Reproductive/Obstetrics                            Lab Results  Component Value Date   WBC 5.3 05/02/2015   HGB 10.2* 05/02/2015   HCT 33.0* 05/02/2015   MCV 85.7 05/02/2015   PLT 441* 05/02/2015   Lab Results  Component Value Date   CREATININE 0.93 05/02/2015   BUN 9 05/02/2015   NA 136 05/02/2015   K 3.7 05/02/2015   CL 101 05/02/2015   CO2 28 05/02/2015    Anesthesia Physical Anesthesia Plan  ASA: II  Anesthesia Plan: General   Post-op Pain Management:    Induction: Intravenous  Airway Management Planned: Oral ETT  Additional Equipment:   Intra-op Plan:   Post-operative Plan: Extubation in OR  Informed Consent: I have reviewed the patients History and Physical, chart, labs and discussed the procedure including the risks, benefits and alternatives for the proposed anesthesia with the patient or authorized representative who has indicated his/her understanding and acceptance.   Dental advisory given  Plan Discussed with: CRNA  Anesthesia Plan Comments:         Anesthesia Quick Evaluation

## 2015-05-06 NOTE — H&P (Signed)
Vickie Galvan is an 39 y.o. female.   Chief Complaint: Recurrent ventral hernia and diastasis recti [id number] ventral and umbilical hernia repair by Dr Dalbert Batman with recurrence and severe diastasis. Patient has had increased pain and bulging at the areas especially periumbilically. Increased bulging above the belly button and diastasis recti very prominent which has worsened over the last few months  Past Medical History  Diagnosis Date  . Anxiety   . Environmental allergies   . ANEMIA, IRON DEFICIENCY   . GERD   . GOITER, MULTINODULAR   . HYPERTENSION   . CONSTIPATION     Past Surgical History  Procedure Laterality Date  . Tubal ligation    . Cesarean section      X 2  . Colonoscopy    . Ventral hernia repair N/A 05/04/2014    Procedure: LAPAROSCOPIC REPAIR MUTIPLE INCARCERATED VENTRAL HERNIAS;  Surgeon: Fanny Skates, MD;  Location: [facility];  Service: General;  Laterality: N/A;  . Insertion of mesh N/A 05/04/2014    Procedure: INSERTION OF MESH;  Surgeon: Fanny Skates, MD;  Location: [facility] OR;  Service: General;  Laterality: N/A;    Family History  Problem Relation Age of Onset  . Hypertension Maternal Grandmother   . Hypertension Maternal Grandfather   . Diabetes Maternal Grandfather   . Stroke Maternal Grandfather    Social History:  reports that she has never smoked. She does not have any smokeless tobacco history on file. She reports that she drinks alcohol. She reports that she does not use illicit drugs.  Allergies:  Allergies  Allergen Reactions  . Azithromycin Other (See Comments)    abd pain    Medications Prior to Admission  Medication Sig Dispense Refill  . ALPRAZolam (XANAX) 0.25 MG tablet Take 1 tablet (0.25 mg total) by mouth 2 (two) times daily as needed for anxiety. 60 tablet 0  . losartan-hydrochlorothiazide (HYZAAR) 100-12.5 MG per tablet Take 1 tablet by mouth daily.    . potassium chloride (K-DUR) 10 MEQ tablet TAKE 1 TABLET (10 MEQ TOTAL) BY  MOUTH DAILY. 30 tablet 8    No results found for this or any previous visit (from the past 48 hour(s)). No results found.  Review of Systems  Constitutional: Negative.   HENT: Negative.   Eyes: Negative.   Respiratory: Negative.   Cardiovascular: Negative.   Gastrointestinal: Positive for abdominal pain.  Genitourinary: Negative.   Musculoskeletal: Negative.   Skin: Negative.   Neurological: Negative.   Endo/Heme/Allergies: Negative.   Psychiatric/Behavioral: Negative.     Blood pressure 164/104, pulse 79, temperature 98.2 F (36.8 C), temperature source Oral, resp. rate 18, height 5\' 6"  (1.676 m), weight 75.297 kg (166 lb), last menstrual period 05/03/2015, SpO2 100 %. Physical Exam   Assessment/Plan Incarcerated reccurent ventral and umbilical hernia with severe diastasis recti for repair of the abo0ve and possible mesh reconstruction Dillion Stowers L 05/06/2015, 7:08 AM

## 2015-05-06 NOTE — Discharge Instructions (Signed)
Activity (include date of return to work if known) As tolerated: NO showers NO driving No heavy activities  Diet:regular No restrictions:  Wound Care: Keep dressing clean & dry  Do not change dressings For Abdominoplasties wear abdominal binder Special Instructions: Do not raise arms up Continue to empty, recharge, & record drainage from J-P drains &/or Hemovacs 2-3 times a day, as needed. Call Doctor if any unusual problems occur such as pain, excessive Bleeding, unrelieved Nausea/vomiting, Fever &/or chills When lying down, keep head elevated on 2-3 pillows or back-rest For Addominoplasties the Jack-knife position Follow-up appointment: Please call the office.  The patient received discharge instruction from:___________________________________________   Patient signature ________________________________________ / Date___________    Signature of individual providing instructions/ Date________________                About my Jackson-Pratt Bulb Drain  What is a Jackson-Pratt bulb? A Jackson-Pratt is a soft, round device used to collect drainage. It is connected to a long, thin drainage catheter, which is held in place by one or two small stiches near your surgical incision site. When the bulb is squeezed, it forms a vacuum, forcing the drainage to empty into the bulb.  Emptying the Jackson-Pratt bulb- To empty the bulb: 1. Release the plug on the top of the bulb. 2. Pour the bulb's contents into a measuring container which your nurse will provide. 3. Record the time emptied and amount of drainage. Empty the drain(s) as often as your     doctor or nurse recommends.  Date                  Time                    Amount (Drain 1)                 Amount (Drain  2)  _____________________________________________________________________  _____________________________________________________________________  _____________________________________________________________________  _____________________________________________________________________  _____________________________________________________________________  _____________________________________________________________________  _____________________________________________________________________  _____________________________________________________________________  Squeezing the Jackson-Pratt Bulb- To squeeze the bulb: 1. Make sure the plug at the top of the bulb is open. 2. Squeeze the bulb tightly in your fist. You will hear air squeezing from the bulb. 3. Replace the plug while the bulb is squeezed. 4. Use a safety pin to attach the bulb to your clothing. This will keep the catheter from     pulling at the bulb insertion site.  When to call your doctor- Call your doctor if:  Drain site becomes red, swollen or hot.  You have a fever greater than 101 degrees F.  There is oozing at the drain site.  Drain falls out (apply a guaze bandage over the drain hole and secure it with tape).  Drainage increases daily not related to activity patterns. (You will usually have more drainage when you are active than when you are resting.)  Drainage has a bad odor.       Post Anesthesia Home Care Instructions  Activity: Get plenty of rest for the remainder of the day. A responsible adult should stay with you for 24 hours following the procedure.  For the next 24 hours, DO NOT: -Drive a car -Paediatric nurse -Drink alcoholic beverages -Take any medication unless instructed by your physician -Make any legal decisions or sign important papers.  Meals: Start with liquid foods such as gelatin or soup. Progress to regular foods as tolerated. Avoid greasy, spicy, heavy foods. If  nausea and/or vomiting occur, drink only clear liquids until  the nausea and/or vomiting subsides. Call your physician if vomiting continues.  Special Instructions/Symptoms: Your throat may feel dry or sore from the anesthesia or the breathing tube placed in your throat during surgery. If this causes discomfort, gargle with warm salt water. The discomfort should disappear within 24 hours.  If you had a scopolamine patch placed behind your ear for the management of post- operative nausea and/or vomiting:  1. The medication in the patch is effective for 72 hours, after which it should be removed.  Wrap patch in a tissue and discard in the trash. Wash hands thoroughly with soap and water. 2. You may remove the patch earlier than 72 hours if you experience unpleasant side effects which may include dry mouth, dizziness or visual disturbances. 3. Avoid touching the patch. Wash your hands with soap and water after contact with the patch.

## 2015-05-07 ENCOUNTER — Encounter (HOSPITAL_BASED_OUTPATIENT_CLINIC_OR_DEPARTMENT_OTHER): Payer: Self-pay | Admitting: Specialist

## 2015-05-07 NOTE — Op Note (Signed)
Vickie Galvan, Vickie Galvan               ACCOUNT NO.:  [id number]  MEDICAL RECORD NO.:  [id number]  LOCATION:                               FACILITY:  [facility]  PHYSICIAN:  Berneta Sages L. [name], M.D.DATE OF BIRTH:  08-15-1975  DATE OF PROCEDURE:  05/06/2015 DATE OF DISCHARGE:  05/06/2015                              OPERATIVE REPORT   A 39 year old lady who approximately 1 year ago had a ventral hernia repair done by Dr. Fanny Skates and according to the patient, it was done with mesh, which was documented by the previously secured operative reports.  The patient had demonstrates severe diastasis recti, but also incarceration of umbilical hernia, very tender at this course and also has a large ventral hernia above that mesh and approximately 6 x 10 cm.  PROCEDURES PLANNED:  Exploration of abdomen, repair of incarcerated hernia, repair of ventral hernia with mesh using UltraPro mesh, lot number is [id number] by [employer].  We used a 15 x 15 cm piece quadruple on this repair and then we did a repair of severe diastasis recti.  All the procedures in detail were explained to the patient, who understands the risks and possible complications, including recurrences. The patient understands and consents to surgery.  DESCRIPTION OF PROCEDURE:  The patient was taken to the operating room, underwent general anesthesia and intubated orally.  Prep was done today. Abdominal area using Hibiclens soap and solution, walled off with sterile towels and drapes so as to make a sterile field.  We were able to outline a horizontal incision across the umbilicus protecting the umbilicus in a U fashion.  We then injected the areas with 0.25% Xylocaine with epinephrine 1:400,000 pounds concentration, a total of 200 mL.  Incision was made.  Carried down through the skin and subcutaneous tissue, down the underlying rectus fascia laterally, then dissected over to above the umbilicus and the curvature of previously drawn.   We dissected down and then the first thing we did was to repair the incarcerated hernia of the umbilicus.  I was able to dissect down to the base with the Metzenbaum scissors and removed the base and opened it up, and we had fat herniating through to the mellitus where we were able to de-herniate that areas and also 2 or 3 areas of intestine, has scarred up against that area as well and we also did adhesions there to release these back down into the peritoneum.  After that, the fascia was then reclosed with multiple sutures of 2-0 Novafil to the area with good success.  Now, dissection was carried up over the rectus abdominis up to the xiphoid process and in doing so, we saw one large like #1 Prolene suture in the area of the ventral hernia, which measured pretty big, approximately 6 x 12 cm.  I was not able to identify any mesh in that area and I was able to repair the area with a quadruple UltraPro mesh graft, which was placed over the defect and secured with multiple sutures of 2-0 Novafil.  Next, the diastasis recti was then dealt with. We were able to repair the severe diastasis recti from the xiphoid process down to the umbilicus  imbricating some of the previously placed UltraPro mesh with good success.  The wounds were irrigated with saline. Hemostasis was maintained again with the Bovie on coagulation.  Flaps were placed back in that location and secured with 2-0 Monocryl subcutaneously and then a running subcuticular stitch of 3-0 Monocryl. The wounds were drained with #10 fully-fluted Blake drain, which was placed in depths of wound and brought out through the lateral-most portion of the incision, secured with 3-0 Prolene.  The wounds were cleansed.  Steri-Strips and soft dressing were applied, then pressure dressings and Hypafix tape.  She tolerated the procedures very well, was taken to the recovery in excellent condition.  Estimated blood loss less than 100 mL.   Complications, none.     [name]. [name], M.D.     Elie Confer  D:  05/06/2015  T:  05/06/2015  Job:  [id number]

## 2015-06-03 ENCOUNTER — Telehealth: Payer: Self-pay | Admitting: *Deleted

## 2015-06-03 DIAGNOSIS — F419 Anxiety disorder, unspecified: Secondary | ICD-10-CM

## 2015-06-03 MED ORDER — ALPRAZOLAM 0.25 MG PO TABS: 0.2500 mg | ORAL_TABLET | Freq: Two times a day (BID) | ORAL | Status: DC | PRN

## 2015-06-03 NOTE — Telephone Encounter (Signed)
Notified pt rx fax to [facility].../lmb 

## 2015-06-03 NOTE — Telephone Encounter (Signed)
Left msg on triage requesting refill on her Xanax. MD out of office pls advise...Johny Chess

## 2015-06-03 NOTE — Telephone Encounter (Signed)
Refill for #30 approved and signed.

## 2015-07-12 ENCOUNTER — Telehealth: Payer: Self-pay | Admitting: *Deleted

## 2015-07-12 ENCOUNTER — Other Ambulatory Visit: Payer: Self-pay | Admitting: Internal Medicine

## 2015-07-12 MED ORDER — LOSARTAN POTASSIUM-HCTZ 100-12.5 MG PO TABS: 1.0000 | ORAL_TABLET | Freq: Every day | ORAL | Status: DC

## 2015-07-12 MED ORDER — POTASSIUM CHLORIDE ER 10 MEQ PO TBCR: EXTENDED_RELEASE_TABLET | ORAL | Status: DC

## 2015-07-12 NOTE — Telephone Encounter (Signed)
Received call pt states her rx's has expired needing new rx's sent to [facility] on her Losartan & klor con. Inform pt sending electronically...Johny Chess

## 2015-08-21 DIAGNOSIS — M6200 Separation of muscle (nontraumatic), unspecified site: Secondary | ICD-10-CM

## 2015-08-21 HISTORY — DX: Separation of muscle (nontraumatic), unspecified site: M62.00

## 2015-09-16 ENCOUNTER — Telehealth: Payer: Self-pay | Admitting: *Deleted

## 2015-09-16 DIAGNOSIS — F419 Anxiety disorder, unspecified: Secondary | ICD-10-CM

## 2015-09-16 MED ORDER — ALPRAZOLAM 0.25 MG PO TABS: 0.2500 mg | ORAL_TABLET | Freq: Two times a day (BID) | ORAL | Status: DC | PRN

## 2015-09-16 NOTE — Telephone Encounter (Signed)
Done hardcopy to Corinne  

## 2015-09-16 NOTE — Telephone Encounter (Signed)
Received call pt is requesting refill on her alprazolam.../lmb 

## 2015-09-17 ENCOUNTER — Encounter (HOSPITAL_BASED_OUTPATIENT_CLINIC_OR_DEPARTMENT_OTHER): Payer: Self-pay | Admitting: *Deleted

## 2015-09-17 DIAGNOSIS — J3489 Other specified disorders of nose and nasal sinuses: Secondary | ICD-10-CM

## 2015-09-17 DIAGNOSIS — R059 Cough, unspecified: Secondary | ICD-10-CM

## 2015-09-17 HISTORY — DX: Other specified disorders of nose and nasal sinuses: J34.89

## 2015-09-17 HISTORY — DX: Cough, unspecified: R05.9

## 2015-09-17 NOTE — Telephone Encounter (Signed)
Faxed script to [facility].../LMB

## 2015-09-17 NOTE — Pre-Procedure Instructions (Signed)
To come for BMET and EKG 

## 2015-09-20 ENCOUNTER — Ambulatory Visit: Payer: Self-pay | Admitting: Specialist

## 2015-09-20 ENCOUNTER — Other Ambulatory Visit: Payer: Self-pay

## 2015-09-20 ENCOUNTER — Encounter (HOSPITAL_BASED_OUTPATIENT_CLINIC_OR_DEPARTMENT_OTHER)
Admission: RE | Admit: 2015-09-20 | Discharge: 2015-09-20 | Disposition: A | Discharge status: Home / Self Care | Payer: BLUE CROSS/BLUE SHIELD | Source: Ambulatory Visit | Attending: Specialist | Admitting: Specialist

## 2015-09-20 DIAGNOSIS — Z79899 Other long term (current) drug therapy: Secondary | ICD-10-CM | POA: Diagnosis not present

## 2015-09-20 DIAGNOSIS — L905 Scar conditions and fibrosis of skin: Secondary | ICD-10-CM | POA: Diagnosis not present

## 2015-09-20 DIAGNOSIS — K432 Incisional hernia without obstruction or gangrene: Secondary | ICD-10-CM | POA: Diagnosis not present

## 2015-09-20 DIAGNOSIS — I10 Essential (primary) hypertension: Secondary | ICD-10-CM | POA: Diagnosis not present

## 2015-09-20 DIAGNOSIS — Y838 Other surgical procedures as the cause of abnormal reaction of the patient, or of later complication, without mention of misadventure at the time of the procedure: Secondary | ICD-10-CM | POA: Diagnosis not present

## 2015-09-20 DIAGNOSIS — K429 Umbilical hernia without obstruction or gangrene: Secondary | ICD-10-CM | POA: Diagnosis present

## 2015-09-20 DIAGNOSIS — L7634 Postprocedural seroma of skin and subcutaneous tissue following other procedure: Secondary | ICD-10-CM | POA: Diagnosis not present

## 2015-09-20 DIAGNOSIS — F419 Anxiety disorder, unspecified: Secondary | ICD-10-CM | POA: Diagnosis not present

## 2015-09-20 LAB — BASIC METABOLIC PANEL
Anion gap: 10 (ref 5–15)
BUN: 9 mg/dL (ref 6–20)
CO2: 27 mmol/L (ref 22–32)
Calcium: 9.2 mg/dL (ref 8.9–10.3)
Chloride: 101 mmol/L (ref 101–111)
Creatinine, Ser: 0.75 mg/dL (ref 0.44–1.00)
GFR calc Af Amer: >60 mL/min (ref >60)
GLUCOSE: 109 mg/dL — AB (ref 65–99)
POTASSIUM: 3.4 mmol/L — AB (ref 3.5–5.1)
Sodium: 138 mmol/L (ref 135–145)

## 2015-09-20 NOTE — Progress Notes (Signed)
ekg cleared by Dr Landry Dyke

## 2015-09-23 ENCOUNTER — Ambulatory Visit (HOSPITAL_BASED_OUTPATIENT_CLINIC_OR_DEPARTMENT_OTHER)
Admission: RE | Admit: 2015-09-23 | Discharge: 2015-09-23 | Disposition: A | Discharge status: Home / Self Care | Payer: BLUE CROSS/BLUE SHIELD | Source: Ambulatory Visit | Attending: Specialist | Admitting: Specialist

## 2015-09-23 ENCOUNTER — Encounter (HOSPITAL_BASED_OUTPATIENT_CLINIC_OR_DEPARTMENT_OTHER): Payer: Self-pay

## 2015-09-23 ENCOUNTER — Ambulatory Visit (HOSPITAL_BASED_OUTPATIENT_CLINIC_OR_DEPARTMENT_OTHER): Payer: BLUE CROSS/BLUE SHIELD | Admitting: Anesthesiology

## 2015-09-23 ENCOUNTER — Ambulatory Visit (HOSPITAL_BASED_OUTPATIENT_CLINIC_OR_DEPARTMENT_OTHER): Admit: 2015-09-23 | Payer: Self-pay | Admitting: Specialist

## 2015-09-23 ENCOUNTER — Encounter (HOSPITAL_BASED_OUTPATIENT_CLINIC_OR_DEPARTMENT_OTHER)
Admission: RE | Disposition: A | Discharge status: Home / Self Care | Payer: Self-pay | Source: Ambulatory Visit | Attending: Specialist

## 2015-09-23 DIAGNOSIS — L7634 Postprocedural seroma of skin and subcutaneous tissue following other procedure: Secondary | ICD-10-CM | POA: Diagnosis not present

## 2015-09-23 DIAGNOSIS — L905 Scar conditions and fibrosis of skin: Secondary | ICD-10-CM | POA: Insufficient documentation

## 2015-09-23 DIAGNOSIS — K432 Incisional hernia without obstruction or gangrene: Secondary | ICD-10-CM | POA: Insufficient documentation

## 2015-09-23 DIAGNOSIS — Z79899 Other long term (current) drug therapy: Secondary | ICD-10-CM | POA: Insufficient documentation

## 2015-09-23 DIAGNOSIS — I10 Essential (primary) hypertension: Secondary | ICD-10-CM | POA: Insufficient documentation

## 2015-09-23 DIAGNOSIS — F419 Anxiety disorder, unspecified: Secondary | ICD-10-CM | POA: Insufficient documentation

## 2015-09-23 DIAGNOSIS — Y838 Other surgical procedures as the cause of abnormal reaction of the patient, or of later complication, without mention of misadventure at the time of the procedure: Secondary | ICD-10-CM | POA: Insufficient documentation

## 2015-09-23 HISTORY — DX: Other specified disorders of nose and nasal sinuses: J34.89

## 2015-09-23 HISTORY — PX: ABDOMINOPLASTY: SHX5355

## 2015-09-23 HISTORY — DX: Separation of muscle (nontraumatic), unspecified site: M62.00

## 2015-09-23 HISTORY — DX: Cough: R05

## 2015-09-23 HISTORY — DX: Nontoxic goiter, unspecified: E04.9

## 2015-09-23 HISTORY — DX: Essential (primary) hypertension: I10

## 2015-09-23 SURGERY — REPAIR, HERNIA, VENTRAL
Anesthesia: General

## 2015-09-23 SURGERY — ABDOMINOPLASTY
Anesthesia: General | Site: Abdomen | Laterality: Bilateral

## 2015-09-23 MED ORDER — DEXAMETHASONE SODIUM PHOSPHATE 4 MG/ML IJ SOLN
INTRAMUSCULAR | Status: DC | PRN
  Administered 2015-09-23: 10 mg via INTRAVENOUS

## 2015-09-23 MED ORDER — PROPOFOL 10 MG/ML IV BOLUS
INTRAVENOUS | Status: AC
  Filled 2015-09-23: qty 20

## 2015-09-23 MED ORDER — ONDANSETRON HCL 4 MG/2ML IJ SOLN
INTRAMUSCULAR | Status: AC
  Filled 2015-09-23: qty 2

## 2015-09-23 MED ORDER — FENTANYL CITRATE (PF) 100 MCG/2ML IJ SOLN
INTRAMUSCULAR | Status: AC
  Filled 2015-09-23: qty 2

## 2015-09-23 MED ORDER — BACITRACIN ZINC 500 UNIT/GM EX OINT
TOPICAL_OINTMENT | CUTANEOUS | Status: AC
  Filled 2015-09-23: qty 2.7

## 2015-09-23 MED ORDER — FENTANYL CITRATE (PF) 100 MCG/2ML IJ SOLN
INTRAMUSCULAR | Status: AC
Start: 2015-09-23 — End: 2015-09-23
  Filled 2015-09-23: qty 2

## 2015-09-23 MED ORDER — PROPOFOL 10 MG/ML IV BOLUS
INTRAVENOUS | Status: DC | PRN
  Administered 2015-09-23: 150 mg via INTRAVENOUS
  Administered 2015-09-23: 50 mg via INTRAVENOUS

## 2015-09-23 MED ORDER — LACTATED RINGERS IV SOLN
INTRAVENOUS | Status: DC
  Administered 2015-09-23 (×2): via INTRAVENOUS

## 2015-09-23 MED ORDER — LIDOCAINE HCL (CARDIAC) 20 MG/ML IV SOLN
INTRAVENOUS | Status: AC
Start: 2015-09-23 — End: 2015-09-23
  Filled 2015-09-23: qty 5

## 2015-09-23 MED ORDER — OXYCODONE HCL 5 MG PO TABS
ORAL_TABLET | ORAL | Status: AC
  Filled 2015-09-23: qty 1

## 2015-09-23 MED ORDER — LIDOCAINE HCL (CARDIAC) 20 MG/ML IV SOLN
INTRAVENOUS | Status: DC | PRN
  Administered 2015-09-23: 100 mg via INTRAVENOUS

## 2015-09-23 MED ORDER — GLYCOPYRROLATE 0.2 MG/ML IJ SOLN: 0.2000 mg | Freq: Once | INTRAMUSCULAR | Status: DC | PRN

## 2015-09-23 MED ORDER — OXYCODONE HCL 5 MG/5ML PO SOLN: 5.0000 mg | Freq: Once | ORAL | Status: AC | PRN

## 2015-09-23 MED ORDER — SUCCINYLCHOLINE CHLORIDE 20 MG/ML IJ SOLN
INTRAMUSCULAR | Status: AC
  Filled 2015-09-23: qty 1

## 2015-09-23 MED ORDER — LIDOCAINE-EPINEPHRINE 0.5 %-1:200000 IJ SOLN
INTRAMUSCULAR | Status: AC
  Filled 2015-09-23: qty 2

## 2015-09-23 MED ORDER — MEPERIDINE HCL 25 MG/ML IJ SOLN: 6.2500 mg | INTRAMUSCULAR | Status: DC | PRN

## 2015-09-23 MED ORDER — HYDROMORPHONE HCL 1 MG/ML IJ SOLN
INTRAMUSCULAR | Status: AC
  Filled 2015-09-23: qty 1

## 2015-09-23 MED ORDER — ONDANSETRON HCL 4 MG/2ML IJ SOLN
INTRAMUSCULAR | Status: DC | PRN
  Administered 2015-09-23: 4 mg via INTRAVENOUS

## 2015-09-23 MED ORDER — MINERAL OIL LIGHT 100 % EX OIL
TOPICAL_OIL | CUTANEOUS | Status: AC
  Filled 2015-09-23: qty 25

## 2015-09-23 MED ORDER — CEFAZOLIN SODIUM-DEXTROSE 2-4 GM/100ML-% IV SOLN
2.0000 g | INTRAVENOUS | Status: AC
  Administered 2015-09-23: 2 g via INTRAVENOUS

## 2015-09-23 MED ORDER — SCOPOLAMINE 1 MG/3DAYS TD PT72: 1.0000 | MEDICATED_PATCH | Freq: Once | TRANSDERMAL | Status: DC | PRN

## 2015-09-23 MED ORDER — HYDROMORPHONE HCL 1 MG/ML IJ SOLN
0.2500 mg | INTRAMUSCULAR | Status: DC | PRN
  Administered 2015-09-23: 0.25 mg via INTRAVENOUS
  Administered 2015-09-23: 0.5 mg via INTRAVENOUS
  Administered 2015-09-23: 0.25 mg via INTRAVENOUS
  Administered 2015-09-23 (×2): 0.5 mg via INTRAVENOUS

## 2015-09-23 MED ORDER — OXYCODONE HCL 5 MG PO TABS
5.0000 mg | ORAL_TABLET | Freq: Once | ORAL | Status: AC | PRN
  Administered 2015-09-23: 5 mg via ORAL

## 2015-09-23 MED ORDER — FENTANYL CITRATE (PF) 100 MCG/2ML IJ SOLN
50.0000 ug | INTRAMUSCULAR | Status: AC | PRN
  Administered 2015-09-23 (×2): 25 ug via INTRAVENOUS
  Administered 2015-09-23: 100 ug via INTRAVENOUS
  Administered 2015-09-23: 25 ug via INTRAVENOUS

## 2015-09-23 MED ORDER — EPINEPHRINE HCL 1 MG/ML IJ SOLN
INTRAMUSCULAR | Status: AC
  Filled 2015-09-23: qty 1

## 2015-09-23 MED ORDER — DEXAMETHASONE SODIUM PHOSPHATE 10 MG/ML IJ SOLN
INTRAMUSCULAR | Status: AC
Start: 2015-09-23 — End: 2015-09-23
  Filled 2015-09-23: qty 1

## 2015-09-23 MED ORDER — MIDAZOLAM HCL 2 MG/2ML IJ SOLN
INTRAMUSCULAR | Status: AC
  Filled 2015-09-23: qty 2

## 2015-09-23 MED ORDER — LIDOCAINE HCL (PF) 1 % IJ SOLN
INTRAMUSCULAR | Status: AC
  Filled 2015-09-23: qty 60

## 2015-09-23 MED ORDER — CEFAZOLIN SODIUM-DEXTROSE 2-4 GM/100ML-% IV SOLN
INTRAVENOUS | Status: AC
  Filled 2015-09-23: qty 100

## 2015-09-23 MED ORDER — LIDOCAINE-EPINEPHRINE 0.5 %-1:200000 IJ SOLN
INTRAMUSCULAR | Status: DC | PRN
  Administered 2015-09-23: 08:00:00 via INTRAMUSCULAR

## 2015-09-23 MED ORDER — MIDAZOLAM HCL 2 MG/2ML IJ SOLN
1.0000 mg | INTRAMUSCULAR | Status: DC | PRN
  Administered 2015-09-23: 2 mg via INTRAVENOUS

## 2015-09-23 MED ORDER — SODIUM BICARBONATE 4 % IV SOLN
INTRAVENOUS | Status: AC
  Filled 2015-09-23: qty 15

## 2015-09-23 SURGICAL SUPPLY — 81 items
APPLICATOR COTTON TIP 6IN STRL (MISCELLANEOUS) ×3 IMPLANT
BAG DECANTER FOR FLEXI CONT (MISCELLANEOUS) IMPLANT
BENZOIN TINCTURE PRP APPL 2/3 (GAUZE/BANDAGES/DRESSINGS) ×6 IMPLANT
BINDER ABDOMINAL 10 UNV 27-48 (MISCELLANEOUS) IMPLANT
BINDER ABDOMINAL 12 SM 30-45 (SOFTGOODS) ×3 IMPLANT
BLADE HEX COATED 2.75 (ELECTRODE) ×3 IMPLANT
BLADE KNIFE PERSONA 10 (BLADE) ×3 IMPLANT
BLADE KNIFE PERSONA 15 (BLADE) ×3 IMPLANT
BNDG COHESIVE 4X5 TAN STRL (GAUZE/BANDAGES/DRESSINGS) IMPLANT
CANISTER SUCT 1200ML W/VALVE (MISCELLANEOUS) ×3 IMPLANT
COMPRESSION GARMENT LG MOREWEL (MISCELLANEOUS) IMPLANT
COTTONBALL LRG STERILE PKG (GAUZE/BANDAGES/DRESSINGS) IMPLANT
COVER BACK TABLE 60X90IN (DRAPES) ×3 IMPLANT
COVER MAYO STAND STRL (DRAPES) ×3 IMPLANT
DRAIN CHANNEL 10F 3/8 F FF (DRAIN) ×6 IMPLANT
DRAPE LAPAROSCOPIC ABDOMINAL (DRAPES) ×3 IMPLANT
DRAPE UTILITY XL STRL (DRAPES) ×3 IMPLANT
DRSG PAD ABDOMINAL 8X10 ST (GAUZE/BANDAGES/DRESSINGS) ×12 IMPLANT
ELECT BLADE 6.5 .24CM SHAFT (ELECTRODE) ×3 IMPLANT
ELECT REM PT RETURN 9FT ADLT (ELECTROSURGICAL) ×3
ELECTRODE REM PT RTRN 9FT ADLT (ELECTROSURGICAL) ×1 IMPLANT
EVACUATOR 3/16  PVC DRAIN (DRAIN)
EVACUATOR 3/16 PVC DRAIN (DRAIN) IMPLANT
EVACUATOR SILICONE 100CC (DRAIN) ×6 IMPLANT
FILTER 7/8 IN (FILTER) ×3 IMPLANT
FILTER LIPOSUCTION (MISCELLANEOUS) IMPLANT
GAUZE SPONGE 4X4 12PLY STRL (GAUZE/BANDAGES/DRESSINGS) ×6 IMPLANT
GAUZE XEROFORM 5X9 LF (GAUZE/BANDAGES/DRESSINGS) ×6 IMPLANT
GLOVE BIO SURGEON STRL SZ 6.5 (GLOVE) ×2 IMPLANT
GLOVE BIO SURGEONS STRL SZ 6.5 (GLOVE) ×1
GLOVE BIOGEL M STRL SZ7.5 (GLOVE) ×3 IMPLANT
GLOVE BIOGEL PI IND STRL 8 (GLOVE) ×1 IMPLANT
GLOVE BIOGEL PI INDICATOR 8 (GLOVE) ×2
GLOVE ECLIPSE 7.0 STRL STRAW (GLOVE) ×3 IMPLANT
GOWN STRL REUS W/ TWL LRG LVL3 (GOWN DISPOSABLE) IMPLANT
GOWN STRL REUS W/ TWL XL LVL3 (GOWN DISPOSABLE) ×2 IMPLANT
GOWN STRL REUS W/TWL LRG LVL3 (GOWN DISPOSABLE)
GOWN STRL REUS W/TWL XL LVL3 (GOWN DISPOSABLE) ×4
HOLDER FOLEY CATH W/STRAP (MISCELLANEOUS) IMPLANT
IV LACTATED RINGERS 1000ML (IV SOLUTION) IMPLANT
IV NS 500ML (IV SOLUTION) ×2
IV NS 500ML BAXH (IV SOLUTION) ×1 IMPLANT
LINER CANISTER 1000CC FLEX (MISCELLANEOUS) ×3 IMPLANT
NDL SAFETY ECLIPSE 18X1.5 (NEEDLE) IMPLANT
NEEDLE HYPO 18GX1.5 SHARP (NEEDLE)
NEEDLE SPNL 18GX3.5 QUINCKE PK (NEEDLE) IMPLANT
NS IRRIG 1000ML POUR BTL (IV SOLUTION) IMPLANT
PACK BASIN DAY SURGERY FS (CUSTOM PROCEDURE TRAY) ×3 IMPLANT
PEN SKIN MARKING BROAD TIP (MISCELLANEOUS) ×3 IMPLANT
PIN SAFETY STERILE (MISCELLANEOUS) ×3 IMPLANT
SHEET MEDIUM DRAPE 40X70 STRL (DRAPES) IMPLANT
SLEEVE SCD COMPRESS KNEE MED (MISCELLANEOUS) ×3 IMPLANT
SPONGE LAP 18X18 X RAY DECT (DISPOSABLE) ×6 IMPLANT
STAPLER VISISTAT 35W (STAPLE) IMPLANT
STOCKINETTE IMPERVIOUS LG (DRAPES) IMPLANT
STRIP SUTURE WOUND CLOSURE 1/2 (SUTURE) ×9 IMPLANT
SUT ETHILON 3 0 PS 1 (SUTURE) ×6 IMPLANT
SUT MNCRL AB 3-0 PS2 18 (SUTURE) ×6 IMPLANT
SUT MON AB 2-0 CT1 36 (SUTURE) IMPLANT
SUT MON AB 5-0 PS2 18 (SUTURE) IMPLANT
SUT NOVA NAB GS-22 2 0 T19 (SUTURE) IMPLANT
SUT PROLENE 1 CT (SUTURE) ×6 IMPLANT
SUT PROLENE 4 0 PS 2 18 (SUTURE) ×3 IMPLANT
SUT SILK 3 0 TIES 17X18 (SUTURE)
SUT SILK 3-0 18XBRD TIE BLK (SUTURE) IMPLANT
SYR 50ML LL SCALE MARK (SYRINGE) ×6 IMPLANT
SYR BULB IRRIGATION 50ML (SYRINGE) ×3 IMPLANT
SYR CONTROL 10ML LL (SYRINGE) IMPLANT
TAPE HYPAFIX 6 X30' (GAUZE/BANDAGES/DRESSINGS) ×1
TAPE HYPAFIX 6X30 (GAUZE/BANDAGES/DRESSINGS) ×2 IMPLANT
TAPE MEASURE 72IN RETRACT (INSTRUMENTS)
TAPE MEASURE LINEN 72IN RETRCT (INSTRUMENTS) IMPLANT
TOWEL OR 17X24 6PK STRL BLUE (TOWEL DISPOSABLE) ×12 IMPLANT
TOWEL OR NON WOVEN STRL DISP B (DISPOSABLE) ×3 IMPLANT
TRAY FOLEY CATH SILVER 16FR (SET/KITS/TRAYS/PACK) ×3 IMPLANT
TUBE CONNECTING 20'X1/4 (TUBING) ×1
TUBE CONNECTING 20X1/4 (TUBING) ×2 IMPLANT
TUBING SET GRADUATE ASPIR 12FT (MISCELLANEOUS) IMPLANT
UNDERPAD 30X30 (UNDERPADS AND DIAPERS) ×6 IMPLANT
VAC PENCILS W/TUBING CLEAR (MISCELLANEOUS) ×3 IMPLANT
YANKAUER SUCT BULB TIP NO VENT (SUCTIONS) ×3 IMPLANT

## 2015-09-23 NOTE — Transfer of Care (Signed)
Immediate Anesthesia Transfer of Care Note  Patient: Vickie Galvan  Procedure(s) Performed: Procedure(s): EXPLORATION OF ABDOMEN REPAIR OF SCAR AND DIASTASIS  (Bilateral)  Patient Location: PACU  Anesthesia Type:General  Level of Consciousness: awake, oriented and patient cooperative  Airway & Oxygen Therapy: Patient Spontanous Breathing and Patient connected to face mask oxygen  Post-op Assessment: Report given to RN and Post -op Vital signs reviewed and stable  Post vital signs: Reviewed and stable  Last Vitals:  Filed Vitals:   09/23/15 0636  BP: 159/97  Pulse: 87  Temp: 36.8 C  Resp: 18    Complications: No apparent anesthesia complications

## 2015-09-23 NOTE — Anesthesia Procedure Notes (Signed)
Procedure Name: LMA Insertion Date/Time: 09/23/2015 7:33 AM Performed by: Lyndee Leo Pre-anesthesia Checklist: Patient identified, Emergency Drugs available, Suction available and Patient being monitored Patient Re-evaluated:Patient Re-evaluated prior to inductionOxygen Delivery Method: Circle System Utilized Preoxygenation: Pre-oxygenation with 100% oxygen Intubation Type: IV induction Ventilation: Mask ventilation without difficulty LMA: LMA inserted LMA Size: 4.0 Number of attempts: 1 Airway Equipment and Method: Bite block Placement Confirmation: positive ETCO2 Tube secured with: Tape Dental Injury: Teeth and Oropharynx as per pre-operative assessment

## 2015-09-23 NOTE — Brief Op Note (Signed)
09/23/2015  8:56 AM  PATIENT:  Vickie Galvan  40 y.o. female  PRE-OPERATIVE DIAGNOSIS:  VENTRAL, UMBILICAL HERNIA  POST-OPERATIVE DIAGNOSIS:  VENTRAL, UMBILICAL HERNIA  PROCEDURE:  Procedure(s): EXPLORATION OF ABDOMEN REPAIR OF SCAR AND DIASTASIS  (Bilateral)  SURGEON:  Surgeon(s) and Role:    * Cristine Polio, MD - Primary  PHYSICIAN ASSISTANT:   ASSISTANTS: none   ANESTHESIA:   general  EBL:  Total I/O In: 1000 [I.V.:1000] Out: 50 [Blood:50]  BLOOD ADMINISTERED:none  DRAINS: (55) Jackson-Pratt drain(s) with closed bulb suction in the abdomen NUMBER 10 X 2   LOCAL MEDICATIONS USED:  LIDOCAINE   SPECIMEN:  Excision  DISPOSITION OF SPECIMEN:  PATHOLOGY  COUNTS:  YES  TOURNIQUET:  * No tourniquets in log *  DICTATION: .Other Dictation: Dictation Number [id number]  PLAN OF CARE: Discharge to home after PACU  PATIENT DISPOSITION:  PACU - hemodynamically stable.   Delay start of Pharmacological VTE agent (>24hrs) due to surgical blood loss or risk of bleeding: yes

## 2015-09-23 NOTE — Anesthesia Postprocedure Evaluation (Signed)
Anesthesia Post Note  Patient: Vickie Galvan  Procedure(s) Performed: Procedure(s) (LRB): EXPLORATION OF ABDOMEN REPAIR OF SCAR AND DIASTASIS  (Bilateral)  Patient location during evaluation: PACU Anesthesia Type: General Level of consciousness: awake and alert Pain management: pain level controlled Vital Signs Assessment: post-procedure vital signs reviewed and stable Respiratory status: spontaneous breathing, nonlabored ventilation, respiratory function stable and patient connected to nasal cannula oxygen Cardiovascular status: blood pressure returned to baseline and stable Postop Assessment: no signs of nausea or vomiting Anesthetic complications: no    Last Vitals:  Filed Vitals:   09/23/15 0930 09/23/15 0945  BP: 155/102 150/106  Pulse: 94 94  Temp:    Resp: 16 14    Last Pain:  Filed Vitals:   09/23/15 0957  PainSc: [place] D Mor[facility] [place]

## 2015-09-23 NOTE — Anesthesia Preprocedure Evaluation (Addendum)
Anesthesia Evaluation  Patient identified by MRN, date of birth, ID band Patient awake    Reviewed: Allergy & Precautions, NPO status , Patient's Chart, lab work & pertinent test results  Airway Mallampati: I  TM Distance: >3 FB Neck ROM: Full    Dental  (+) Teeth Intact, Dental Advisory Given   Pulmonary    breath sounds clear to auscultation       Cardiovascular hypertension, Pt. on medications  Rhythm:Regular Rate:Normal     Neuro/Psych    GI/Hepatic GERD  Medicated and Controlled,  Endo/Other    Renal/GU      Musculoskeletal   Abdominal   Peds  Hematology   Anesthesia Other Findings   Reproductive/Obstetrics                             Anesthesia Physical Anesthesia Plan  ASA: II  Anesthesia Plan: General   Post-op Pain Management:    Induction: Intravenous  Airway Management Planned: LMA  Additional Equipment:   Intra-op Plan:   Post-operative Plan: Extubation in OR  Informed Consent: I have reviewed the patients History and Physical, chart, labs and discussed the procedure including the risks, benefits and alternatives for the proposed anesthesia with the patient or authorized representative who has indicated his/her understanding and acceptance.   Dental advisory given  Plan Discussed with: CRNA, Anesthesiologist and Surgeon  Anesthesia Plan Comments:         Anesthesia Quick Evaluation  

## 2015-09-23 NOTE — Discharge Instructions (Signed)
Activity (include date of return to work if known) As tolerated: NO showers NO driving No heavy activities  Diet:regular No restrictions:  Wound Care: Keep dressing clean & dry  Do not change dressings For Abdominoplasties wear abdominal binder  Special Instructions: Do not raise arms up Continue to empty, recharge, & record drainage from J-P drains &/or Hemovacs 2-3 times a day, as needed.  Call Doctor if any unusual problems occur such as pain, excessive Bleeding, unrelieved Nausea/vomiting, Fever &/or chills  When lying down, keep head elevated on 2-3 pillows or back-rest For Addominoplasties the Jack-knife position Follow-up appointment: Please call the office.  The patient received discharge instruction from:___________________________________________   Patient signature ________________________________________ / Date___________    Signature of individual providing instructions/ Date________________              Chrys Racer Totals  Bring this sheet to all of your post-operative appointments while you have your drains.  Please measure your drains by CC's or ML's.  Make sure you drain and measure your JP Drains 2 or 3 times per day.  At the end of each day, add up totals for the left side and add up totals for the right side.    ( 9 am )     ( 3 pm )        ( 9 pm )                Date L  R  L  R  L  R  Total L/R                                                                                                                                                                                         Post Anesthesia Home Care Instructions  Activity: Get plenty of rest for the remainder of the day. A responsible adult should stay with you for 24 hours following the procedure.  For the next 24 hours, DO NOT: -Drive a car -Paediatric nurse -Drink alcoholic beverages -Take any medication unless instructed by your physician -Make any legal decisions or sign  important papers.  Meals: Start with liquid foods such as gelatin or soup. Progress to regular foods as tolerated. Avoid greasy, spicy, heavy foods. If nausea and/or vomiting occur, drink only clear liquids until the nausea and/or vomiting subsides. Call your physician if vomiting continues.  Special Instructions/Symptoms: Your throat may feel dry or sore from the anesthesia or the breathing tube placed in your throat during surgery. If this causes discomfort, gargle with warm salt water. The discomfort should disappear within 24 hours.  If you had a scopolamine patch  placed behind your ear for the management of post- operative nausea and/or vomiting:  1. The medication in the patch is effective for 72 hours, after which it should be removed.  Wrap patch in a tissue and discard in the trash. Wash hands thoroughly with soap and water. 2. You may remove the patch earlier than 72 hours if you experience unpleasant side effects which may include dry mouth, dizziness or visual disturbances. 3. Avoid touching the patch. Wash your hands with soap and water after contact with the patch.

## 2015-09-23 NOTE — H&P (Signed)
Vickie Galvan is an 40 y.o. female.   Chief Complaint: Abdominal pain and swelling HPI: SP repair of umbilical and ventral hernia by another surfeon with re repair by myself. Still has a persistent swelling and discomfort and ? Fluid buildup  For re exploration and repair of any underlying tissues  Past Medical History  Diagnosis Date  . Anxiety   . Goiter   . Hypertension     states under control with med., has been on med. x 9 yr.  . Muscle diastasis 08/2015    abd.  . Stuffy and runny nose 09/17/2015    clear drainage from nose, per pt.  . Cough 09/17/2015    Past Surgical History  Procedure Laterality Date  . Cesarean section  01/06/2005; 07/02/2006    X 2  . Colonoscopy    . Ventral hernia repair N/A 05/04/2014    Procedure: LAPAROSCOPIC REPAIR MUTIPLE INCARCERATED VENTRAL HERNIAS;  Surgeon: Fanny Skates, MD;  Location: [facility];  Service: General;  Laterality: N/A;  . Insertion of mesh N/A 05/04/2014    Procedure: INSERTION OF MESH;  Surgeon: Fanny Skates, MD;  Location: [facility];  Service: General;  Laterality: N/A;  . Ventral hernia repair N/A 05/06/2015    Procedure: EXPLORATION OF ABDOMIN, REPAIR RECURRENT VENTRAL HERNIA AND SEVERE DIASTASIS RECTI;  Surgeon: Cristine Polio, MD;  Location: [facility];  Service: Plastics;  Laterality: N/A;  . Insertion of mesh N/A 05/06/2015    Procedure: INSERTION OF MESH;  Surgeon: Cristine Polio, MD;  Location: [facility];  Service: Plastics;  Laterality: N/A;  umbiical  . Tubal ligation  07/02/2006  . Right oophorectomy  01/06/2005    Family History  Problem Relation Age of Onset  . Hypertension Maternal Grandmother   . Hypertension Maternal Grandfather   . Diabetes Maternal Grandfather   . Stroke Maternal Grandfather    Social History:  reports that she has never smoked. She has never used smokeless tobacco. She reports that she drinks alcohol. She reports that she does not use illicit  drugs.  Allergies:  Allergies  Allergen Reactions  . Azithromycin Other (See Comments)    ABD. PAIN    Medications Prior to Admission  Medication Sig Dispense Refill  . ALPRAZolam (XANAX) 0.25 MG tablet Take 1 tablet (0.25 mg total) by mouth 2 (two) times daily as needed for anxiety. 30 tablet 3  . losartan-hydrochlorothiazide (HYZAAR) 100-12.5 MG tablet Take 1 tablet by mouth daily. 90 tablet 1  . potassium chloride (K-DUR) 10 MEQ tablet Take 10 mEq by mouth daily.      No results found for this or any previous visit (from the past 48 hour(s)). No results found.  Review of Systems  Constitutional: Negative.   HENT: Negative.   Eyes: Negative.   Respiratory: Negative.   Cardiovascular: Negative.   Gastrointestinal: Positive for abdominal pain.  Genitourinary: Negative.   Musculoskeletal: Negative.   Skin: Negative.   Neurological: Negative.   Endo/Heme/Allergies: Negative.   Psychiatric/Behavioral: Negative.     Blood pressure 159/97, pulse 87, temperature 98.2 F (36.8 C), temperature source Oral, resp. rate 18, height 5\' 6"  (1.676 m), weight 74.447 kg (164 lb 2 oz), last menstrual period 08/23/2015, SpO2 100 %. Physical Exam   Assessment/Plan Swelling and pain abdomen  For exploration and repair of any underlying tissues or fluid extraction  [name], MD 09/23/2015, 7:15 AM

## 2015-09-24 ENCOUNTER — Encounter (HOSPITAL_BASED_OUTPATIENT_CLINIC_OR_DEPARTMENT_OTHER): Payer: Self-pay | Admitting: Specialist

## 2015-09-24 NOTE — Op Note (Signed)
Vickie Galvan, Vickie Galvan               ACCOUNT NO.:  [id number]  MEDICAL RECORD NO.:  [id number]  LOCATION:                                 FACILITY:  PHYSICIAN:  Berneta Sages L. Arthelia Callicott, M.D.DATE OF BIRTH:  07/03/1975  DATE OF PROCEDURE:  09/23/2015 DATE OF DISCHARGE:                              OPERATIVE REPORT   A 40 year old lady had previous ventral hernia repair done by general surgeon several months ago, had increased discomfort and swelling.  Had exploration of the area, repair of the area with mesh.  She has now developed a mass effect in the upper portion of her abdomen.  There has since been resistance to drainage.  I have drained in my office several times subcutaneously with no fluid coming back out of the mass, it has gotten slightly larger.  The patient is having increased pain and discomfort and there is more laxity of the abdomen.  PROCEDURES DONE:  Exploration, removal of large mass effect, consistent with seroma mass, repair of large ventral hernia, measured approximately 8 inches in length by 2 inches, scar revision of the abdomen.  ANESTHESIA:  General.  DESCRIPTION OF PROCEDURE:  Preoperatively, the patient was drawn for the surgical procedure.  She was then taken to the operating room, placed on the operating room table in supine position, was given adequate general anesthesia, intubated orally, prep was done to the total abdominal area using Hibiclens soap and solution, walled off with sterile towels and drapes so as to make a sterile field.  1/3% Xylocaine with epinephrine was injected locally, total of 150 mL for vasoconstriction.  Previous transverse incision was opened out laterally approximately 2 cm and scar cicatrix on the right side was excised.  Dissection was carried down through skin, subcutaneous tissue with a #15 blade.  Hemostasis maintained with the Bovie anticoagulation.  Next, coagulation was taken down to the rectus fascia.  Then, dissection  carried up over the upper abdomen where there was a mass effect, measured approximately 6 inches x 8 inches.  We dissected over the mass after proper hemostasis.  Gentle dissection was carried out and the mass had layers of dissection.  Using Metzenbaum, I was able to remove the total mass from the area.  I opened it up and had fluid inside, consistent with seromucinous formation. There was no evidence of any bowel or any extrusion of visceral contents through that area, however, we did examine the area, showing several sutures were ruptured, consistent with too much activity or if there is exertion by the patient from the xiphoid process down to the umbilicus. This was cleared out.  Irrigation was done with multiple irrigations. Previous mesh was still intact in the area.  The open area was then reclosed with a running #1 Prolene suture to repair the hernia and then another one opposite way, repairing the hernia in a locking suture display.  After this, after proper hemostasis, irrigation was done with copious amounts of saline, approximately 1 liter.  The flaps were then placed on them and repair was done with deep sutures of 2-0 Monocryl x2 layers, a subdermal suture of 3-0 Monocryl, then a running subcuticular stitch of 3-0 Monocryl.  The wounds were drained with #10 fully fluted Blake drains x2 which were brought out at each end and secured with 3-0 Prolene.  The wounds were cleansed.  Steri- Strips and soft dressing were applied to all areas and soft fluffy dressings and pressure dressings.  Hypafix tape was applied over the dressings and abdominal support.  She tolerated the procedures very, very well, and was taken to the recovery in stable condition.     [name]. [name], M.D.     Elie Confer  D:  09/23/2015  T:  09/24/2015  Job:  [id number]

## 2015-10-27 ENCOUNTER — Other Ambulatory Visit: Payer: Self-pay | Admitting: Internal Medicine

## 2015-10-28 ENCOUNTER — Other Ambulatory Visit: Payer: Self-pay | Admitting: Internal Medicine

## 2015-12-16 ENCOUNTER — Telehealth: Payer: Self-pay | Admitting: *Deleted

## 2015-12-16 DIAGNOSIS — F419 Anxiety disorder, unspecified: Secondary | ICD-10-CM

## 2015-12-16 NOTE — Telephone Encounter (Signed)
Left msg on triage needing refill on her Lorazepam.../lmb

## 2015-12-17 MED ORDER — ALPRAZOLAM 0.25 MG PO TABS: 0.2500 mg | ORAL_TABLET | Freq: Two times a day (BID) | ORAL | Status: DC | PRN

## 2015-12-17 NOTE — Telephone Encounter (Signed)
Notified pt w/MD response. Made cpx appt for 7/13,...Vickie Galvan

## 2015-12-17 NOTE — Telephone Encounter (Signed)
Xanax done 1 mo only,   Pt due for ROV please

## 2016-01-02 ENCOUNTER — Other Ambulatory Visit (INDEPENDENT_AMBULATORY_CARE_PROVIDER_SITE_OTHER): Payer: BLUE CROSS/BLUE SHIELD

## 2016-01-02 ENCOUNTER — Ambulatory Visit (INDEPENDENT_AMBULATORY_CARE_PROVIDER_SITE_OTHER): Payer: BLUE CROSS/BLUE SHIELD | Admitting: Internal Medicine

## 2016-01-02 ENCOUNTER — Encounter: Payer: Self-pay | Admitting: Internal Medicine

## 2016-01-02 VITALS — BP 136/70 | HR 96 | Temp 99.1°F | Resp 20 | Wt 168.0 lb

## 2016-01-02 DIAGNOSIS — Z0001 Encounter for general adult medical examination with abnormal findings: Secondary | ICD-10-CM

## 2016-01-02 DIAGNOSIS — G4710 Hypersomnia, unspecified: Secondary | ICD-10-CM

## 2016-01-02 DIAGNOSIS — R739 Hyperglycemia, unspecified: Secondary | ICD-10-CM

## 2016-01-02 DIAGNOSIS — D509 Iron deficiency anemia, unspecified: Secondary | ICD-10-CM

## 2016-01-02 DIAGNOSIS — E042 Nontoxic multinodular goiter: Secondary | ICD-10-CM

## 2016-01-02 DIAGNOSIS — R6889 Other general symptoms and signs: Secondary | ICD-10-CM | POA: Diagnosis not present

## 2016-01-02 DIAGNOSIS — I10 Essential (primary) hypertension: Secondary | ICD-10-CM

## 2016-01-02 DIAGNOSIS — F419 Anxiety disorder, unspecified: Secondary | ICD-10-CM

## 2016-01-02 DIAGNOSIS — G4733 Obstructive sleep apnea (adult) (pediatric): Secondary | ICD-10-CM | POA: Insufficient documentation

## 2016-01-02 LAB — URINALYSIS, ROUTINE W REFLEX MICROSCOPIC
BILIRUBIN URINE: NEGATIVE
Hgb urine dipstick: NEGATIVE
Ketones, ur: NEGATIVE
Leukocytes, UA: NEGATIVE
NITRITE: NEGATIVE
PH: 7 (ref 5.0–8.0)
SPECIFIC GRAVITY, URINE: 1.015 (ref 1.000–1.030)
TOTAL PROTEIN, URINE-UPE24: NEGATIVE
Urine Glucose: NEGATIVE
Urobilinogen, UA: 0.2 (ref 0.0–1.0)

## 2016-01-02 LAB — CBC WITH DIFFERENTIAL/PLATELET
BASOS PCT: 0.7 % (ref 0.0–3.0)
Basophils Absolute: 0 10*3/uL (ref 0.0–0.1)
EOS ABS: 0.1 10*3/uL (ref 0.0–0.7)
Eosinophils Relative: 2.5 % (ref 0.0–5.0)
HCT: 31 % — ABNORMAL LOW (ref 36.0–46.0)
HEMOGLOBIN: 9.9 g/dL — AB (ref 12.0–15.0)
LYMPHS ABS: 2.2 10*3/uL (ref 0.7–4.0)
Lymphocytes Relative: 37.9 % (ref 12.0–46.0)
MCHC: 32 g/dL (ref 30.0–36.0)
MCV: 80 fl (ref 78.0–100.0)
MONO ABS: 0.7 10*3/uL (ref 0.1–1.0)
Monocytes Relative: 11.3 % (ref 3.0–12.0)
NEUTROS PCT: 47.6 % (ref 43.0–77.0)
Neutro Abs: 2.8 10*3/uL (ref 1.4–7.7)
Platelets: 389 10*3/uL (ref 150.0–400.0)
RBC: 3.87 Mil/uL (ref 3.87–5.11)
RDW: 18.5 % — AB (ref 11.5–15.5)
WBC: 5.8 10*3/uL (ref 4.0–10.5)

## 2016-01-02 LAB — TSH: TSH: 2.42 u[IU]/mL (ref 0.35–4.50)

## 2016-01-02 LAB — HEMOGLOBIN A1C: HEMOGLOBIN A1C: 5.9 % (ref 4.6–6.5)

## 2016-01-02 LAB — T4, FREE: Free T4: 0.75 ng/dL (ref 0.60–1.60)

## 2016-01-02 NOTE — Assessment & Plan Note (Signed)
Asympt, for a1c,  to f/u any worsening symptoms or concerns  

## 2016-01-02 NOTE — Progress Notes (Signed)
Pre visit review using our clinic review tool, if applicable. No additional management support is needed unless otherwise documented below in the visit note. 

## 2016-01-02 NOTE — Assessment & Plan Note (Signed)
Stable symptoms and exam, for free t4 in addition to labs

## 2016-01-02 NOTE — Assessment & Plan Note (Signed)

## 2016-01-02 NOTE — Assessment & Plan Note (Signed)
stable overall by history and exam, recent data reviewed with pt, and pt to continue medical treatment as before,  to f/u any worsening symptoms or concerns Lab Results  Component Value Date   WBC 5.3 05/02/2015   HGB 10.2* 05/02/2015   HCT 33.0* 05/02/2015   PLT 441* 05/02/2015   GLUCOSE 109* 09/20/2015   CHOL 184 08/01/2013   TRIG 87.0 08/01/2013   HDL 54.00 08/01/2013   LDLDIRECT 124.5 05/24/2012   LDLCALC 113* 08/01/2013   ALT 15 04/26/2014   AST 21 04/26/2014   NA 138 09/20/2015   K 3.4* 09/20/2015   CL 101 09/20/2015   CREATININE 0.75 09/20/2015   BUN 9 09/20/2015   CO2 27 09/20/2015   TSH 1.34 03/30/2014

## 2016-01-02 NOTE — Patient Instructions (Signed)
Please continue all other medications as before, and refills have been done if requested.  Please have the pharmacy call with any other refills you may need.  Please continue your efforts at being more active, low cholesterol diet, and weight control.  You are otherwise up to date with prevention measures today.  Please keep your appointments with your specialists as you may have planned  You will be contacted regarding the referral for: pulmonary  Please go to the LAB in the Basement (turn left off the elevator) for the tests to be done today  You will be contacted by phone if any changes need to be made immediately.  Otherwise, you will receive a letter about your results with an explanation, but please check with MyChart first.  Please remember to sign up for MyChart if you have not done so, as this will be important to you in the future with finding out test results, communicating by private email, and scheduling acute appointments online when needed.  Please return in 1 year for your yearly visit, or sooner if needed, with Lab testing done 3-5 days before  

## 2016-01-02 NOTE — Progress Notes (Signed)
Subjective:    Patient ID: Vickie Galvan, female    DOB: 1975-09-05, 40 y.o.   MRN: [id number]  HPI  Here for wellness and f/u;  Overall doing ok;  Pt denies Chest pain, worsening SOB, DOE, wheezing, orthopnea, PND, worsening LE edema, palpitations, dizziness or syncope.  Pt denies neurological change such as new headache, facial or extremity weakness.. Pt states overall good compliance with treatment and medications, good tolerability, and has been trying to follow appropriate diet.No fever, night sweats, wt loss, loss of appetite, or other constitutional symptoms.  Pt states good ability with ADL's, has low fall risk, home safety reviewed and adequate, no other significant changes in hearing or vision, and only occasionally active with exercise.  Now recuperating from right ankle surgury after fx April, 2017, also s/p April 0000000 umbilical hernia repair, not yet back to doing more excercise BP Readings from Last 3 Encounters:  01/02/16 136/70  09/23/15 165/101  05/06/15 141/106   Wt Readings from Last 3 Encounters:  01/02/16 168 lb (76.204 kg)  09/23/15 164 lb 2 oz (74.447 kg)  05/06/15 166 lb (75.297 kg)  Denies hyper or hypo thyroid symptoms such as voice, skin or hair change.  Has seen endo in past but asks for labs done here.,  Pt denies polydipsia, polyuria,  states overall good compliance with meds, trying to follow lower cholesterol diet, Does also have significant daytime somnolence, snoring at night, asks for referral to r/o sleep apnea.  Denies worsening depressive symptoms, suicidal ideation, or panic; has ongoing anxiety, not increased recently. No overt bleeding or bruising.  Not pregnant, s/p BTL Past Medical History  Diagnosis Date  . Anxiety   . Goiter   . Hypertension     states under control with med., has been on med. x 9 yr.  . Muscle diastasis 08/2015    abd.  . Stuffy and runny nose 09/17/2015    clear drainage from nose, per pt.  . Cough 09/17/2015   Past Surgical  History  Procedure Laterality Date  . Cesarean section  01/06/2005; 07/02/2006    X 2  . Colonoscopy    . Ventral hernia repair N/A 05/04/2014    Procedure: LAPAROSCOPIC REPAIR MUTIPLE INCARCERATED VENTRAL HERNIAS;  Surgeon: Fanny Skates, MD;  Location: [place];  Service: General;  Laterality: N/A;  . Insertion of mesh N/A 05/04/2014    Procedure: INSERTION OF MESH;  Surgeon: Fanny Skates, MD;  Location: [place];  Service: General;  Laterality: N/A;  . Ventral hernia repair N/A 05/06/2015    Procedure: EXPLORATION OF ABDOMIN, REPAIR RECURRENT VENTRAL HERNIA AND SEVERE DIASTASIS RECTI;  Surgeon: Cristine Polio, MD;  Location: [place];  Service: Plastics;  Laterality: N/A;  . Insertion of mesh N/A 05/06/2015    Procedure: INSERTION OF MESH;  Surgeon: Cristine Polio, MD;  Location: [place];  Service: Plastics;  Laterality: N/A;  umbiical  . Tubal ligation  07/02/2006  . Right oophorectomy  01/06/2005  . Abdominoplasty Bilateral 09/23/2015    Procedure: EXPLORATION OF ABDOMEN REPAIR OF SCAR AND DIASTASIS ;  Surgeon: Cristine Polio, MD;  Location: [place];  Service: Plastics;  Laterality: Bilateral;    reports that she has never smoked. She has never used smokeless tobacco. She reports that she drinks alcohol. She reports that she does not use illicit drugs. family history includes Diabetes in her maternal grandfather; Hypertension in her maternal grandfather and maternal grandmother; Stroke in her maternal grandfather. Allergies  Allergen Reactions  . Azithromycin Other (See Comments)    ABD. PAIN   Current Outpatient Prescriptions on File Prior to Visit  Medication Sig Dispense Refill  . ALPRAZolam (XANAX) 0.25 MG tablet Take 1 tablet (0.25 mg total) by mouth 2 (two) times daily as needed for anxiety. 30 tablet 0  . losartan-hydrochlorothiazide (HYZAAR) 100-12.5 MG tablet TAKE 1 TABLET BY MOUTH DAILY. 90 tablet 0  . potassium chloride  (K-DUR) 10 MEQ tablet Take 10 mEq by mouth daily.     No current facility-administered medications on file prior to visit.   Review of Systems Constitutional: Negative for increased diaphoresis, or other activity, appetite or siginficant weight change other than noted HENT: Negative for worsening hearing loss, ear pain, facial swelling, mouth sores and neck stiffness.   Eyes: Negative for other worsening pain, redness or visual disturbance.  Respiratory: Negative for choking or stridor Cardiovascular: Negative for other chest pain and palpitations.  Gastrointestinal: Negative for worsening diarrhea, blood in stool, or abdominal distention Genitourinary: Negative for hematuria, flank pain or change in urine volume.  Musculoskeletal: Negative for myalgias or other joint complaints.  Skin: Negative for other color change and wound or drainage.  Neurological: Negative for syncope and numbness. other than noted Hematological: Negative for adenopathy. or other swelling Psychiatric/Behavioral: Negative for hallucinations, SI, self-injury, decreased concentration or other worsening agitation.      Objective:   Physical Exam BP 136/70 mmHg  Pulse 96  Temp(Src) 99.1 F (37.3 C) (Oral)  Resp 20  Wt 168 lb (76.204 kg)  SpO2 98% VS noted, obese Constitutional: Pt is oriented to person, place, and time. Appears well-developed and well-nourished, in no significant distress Head: Normocephalic and atraumatic  Eyes: Conjunctivae and EOM are normal. Pupils are equal, round, and reactive to light Right Ear: External ear normal.  Left Ear: External ear normal Nose: Nose normal.  Mouth/Throat: Oropharynx is clear and moist  Neck: Normal range of motion. Neck supple. No JVD present. No tracheal deviation present or significant neck LA or mass Cardiovascular: Normal rate, regular rhythm, normal heart sounds and intact distal pulses.   Pulmonary/Chest: Effort normal and breath sounds without rales or  wheezing  Abdominal: Soft. Bowel sounds are normal. NT. No HSM  Musculoskeletal: Normal range of motion. Exhibits no edema Lymphadenopathy: Has no cervical adenopathy.  Neurological: Pt is alert and oriented to person, place, and time. Pt has normal reflexes. No cranial nerve deficit. Motor grossly intact Skin: Skin is warm and dry. No rash noted or new ulcers Psychiatric:  Has normal mood and affect. Behavior is normal.    Assessment & Plan:

## 2016-01-02 NOTE — Assessment & Plan Note (Signed)
stable overall by history and exam, recent data reviewed with pt, and pt to continue medical treatment as before,  to f/u any worsening symptoms or concerns BP Readings from Last 3 Encounters:  01/02/16 136/70  09/23/15 165/101  05/06/15 141/106

## 2016-01-02 NOTE — Assessment & Plan Note (Signed)
No recent overt blood loss, for cbc, iron today,  to f/u any worsening symptoms or concerns

## 2016-01-02 NOTE — Assessment & Plan Note (Signed)
Cant r/o osa, for pulm referral  In addition to the time spent performing CPE, I spent an additional 25 minutes face to face,in which greater than 50% of this time was spent in counseling and coordination of care for patient's acute illness as documented.

## 2016-01-03 ENCOUNTER — Other Ambulatory Visit: Payer: Self-pay | Admitting: Internal Medicine

## 2016-01-03 DIAGNOSIS — D509 Iron deficiency anemia, unspecified: Secondary | ICD-10-CM

## 2016-01-03 LAB — BASIC METABOLIC PANEL
BUN: 13 mg/dL (ref 6–23)
CO2: 26 mEq/L (ref 19–32)
CREATININE: 0.75 mg/dL (ref 0.40–1.20)
Calcium: 9.7 mg/dL (ref 8.4–10.5)
Chloride: 100 mEq/L (ref 96–112)
GFR: 109.93 mL/min (ref >60.00)
GLUCOSE: 87 mg/dL (ref 70–99)
POTASSIUM: 4.1 meq/L (ref 3.5–5.1)
SODIUM: 137 meq/L (ref 135–145)

## 2016-01-03 LAB — LIPID PANEL
CHOLESTEROL: 201 mg/dL — AB (ref 0–200)
HDL: 55.7 mg/dL (ref >39.00)
LDL Cholesterol: 135 mg/dL — ABNORMAL HIGH (ref 0–99)
NonHDL: 144.87
TRIGLYCERIDES: 48 mg/dL (ref 0.0–149.0)
Total CHOL/HDL Ratio: 4
VLDL: 9.6 mg/dL (ref 0.0–40.0)

## 2016-01-03 LAB — IBC PANEL
IRON: 21 ug/dL — AB (ref 42–145)
SATURATION RATIOS: 3.5 % — AB (ref 20.0–50.0)
TRANSFERRIN: 427 mg/dL — AB (ref 212.0–360.0)

## 2016-01-03 LAB — HEPATIC FUNCTION PANEL
ALT: 21 U/L (ref 0–35)
AST: 39 U/L — AB (ref 0–37)
Albumin: 4.1 g/dL (ref 3.5–5.2)
Alkaline Phosphatase: 51 U/L (ref 39–117)
BILIRUBIN DIRECT: 0.1 mg/dL (ref 0.0–0.3)
BILIRUBIN TOTAL: 0.5 mg/dL (ref 0.2–1.2)
TOTAL PROTEIN: 8.5 g/dL — AB (ref 6.0–8.3)

## 2016-01-06 ENCOUNTER — Telehealth: Payer: Self-pay | Admitting: *Deleted

## 2016-01-06 DIAGNOSIS — F419 Anxiety disorder, unspecified: Secondary | ICD-10-CM

## 2016-01-06 MED ORDER — POTASSIUM CHLORIDE ER 10 MEQ PO TBCR: 10.0000 meq | EXTENDED_RELEASE_TABLET | Freq: Every day | ORAL | Status: DC

## 2016-01-06 MED ORDER — LOSARTAN POTASSIUM-HCTZ 100-12.5 MG PO TABS: 1.0000 | ORAL_TABLET | Freq: Every day | ORAL | Status: DC

## 2016-01-06 NOTE — Telephone Encounter (Signed)
Left msg on triage stating she came in last Thurs to gave her CPX nurse ask did she needed refills, but when she went to pharmacy there was no rx was sent in. Sent maintenance pls advise on xanax...Johny Chess

## 2016-01-07 MED ORDER — ALPRAZOLAM 0.25 MG PO TABS: 0.2500 mg | ORAL_TABLET | Freq: Two times a day (BID) | ORAL | Status: DC | PRN

## 2016-01-07 NOTE — Telephone Encounter (Signed)
Done hardcopy to Corinne  

## 2016-01-07 NOTE — Telephone Encounter (Signed)
Medication refill sent to pharmacy  

## 2016-01-15 ENCOUNTER — Encounter: Payer: Self-pay | Admitting: Internal Medicine

## 2016-01-15 ENCOUNTER — Ambulatory Visit (INDEPENDENT_AMBULATORY_CARE_PROVIDER_SITE_OTHER): Payer: BLUE CROSS/BLUE SHIELD | Admitting: Internal Medicine

## 2016-01-15 VITALS — BP 114/72 | HR 96 | Ht 66.0 in | Wt 169.2 lb

## 2016-01-15 DIAGNOSIS — E042 Nontoxic multinodular goiter: Secondary | ICD-10-CM

## 2016-01-15 DIAGNOSIS — G4733 Obstructive sleep apnea (adult) (pediatric): Secondary | ICD-10-CM

## 2016-01-15 DIAGNOSIS — G4710 Hypersomnia, unspecified: Secondary | ICD-10-CM | POA: Diagnosis not present

## 2016-01-15 NOTE — Progress Notes (Signed)
01/15/2016-40 year old female never smoker referred courtesy of Dr Jenny Reichmann; Hypersomnolence; never had sleep study. Medical problem list including goiter, HBP, iron deficiency anemia, GERD She says for several years her husband has noted loud snoring and witnessed apneas. She recognizes frequent brief awakenings at night. Visibly comfortable in bed with regular sleep schedule. No ENT surgery and denies history of heart or lung disease. Occasionally sleepy in the daytime. Denies symptoms suggesting cataplexy or sleep paralysis. Treated for hypertension. Weight has gone up 15-20 pounds in the last 2 years.  Prior to Admission medications   Medication Sig Start Date End Date Taking? Authorizing Provider  ALPRAZolam (XANAX) 0.25 MG tablet Take 1 tablet (0.25 mg total) by mouth 2 (two) times daily as needed for anxiety. 01/07/16 01/06/17 Yes Biagio Borg, MD  losartan-hydrochlorothiazide (HYZAAR) 100-12.5 MG tablet Take 1 tablet by mouth daily. 01/06/16  Yes Biagio Borg, MD  potassium chloride (K-DUR) 10 MEQ tablet Take 1 tablet (10 mEq total) by mouth daily. 01/06/16  Yes Biagio Borg, MD   Past Medical History:  Diagnosis Date  . Anxiety   . Cough 09/17/2015  . Goiter   . Hypertension    states under control with med., has been on med. x 9 yr.  . Muscle diastasis 08/2015   abd.  . Stuffy and runny nose 09/17/2015   clear drainage from nose, per pt.   Past Surgical History:  Procedure Laterality Date  . ABDOMINOPLASTY Bilateral 09/23/2015   Procedure: EXPLORATION OF ABDOMEN REPAIR OF SCAR AND DIASTASIS ;  Surgeon: Cristine Polio, MD;  Location: [place];  Service: Plastics;  Laterality: Bilateral;  . CESAREAN SECTION  01/06/2005; 07/02/2006   X 2  . COLONOSCOPY    . INSERTION OF MESH N/A 05/04/2014   Procedure: INSERTION OF MESH;  Surgeon: Fanny Skates, MD;  Location: [place];  Service: General;  Laterality: N/A;  . INSERTION OF MESH N/A 05/06/2015   Procedure: INSERTION OF MESH;   Surgeon: Cristine Polio, MD;  Location: [place];  Service: Plastics;  Laterality: N/A;  umbiical  . RIGHT OOPHORECTOMY  01/06/2005  . TUBAL LIGATION  07/02/2006  . VENTRAL HERNIA REPAIR N/A 05/04/2014   Procedure: LAPAROSCOPIC REPAIR MUTIPLE INCARCERATED VENTRAL HERNIAS;  Surgeon: Fanny Skates, MD;  Location: [place];  Service: General;  Laterality: N/A;  . VENTRAL HERNIA REPAIR N/A 05/06/2015   Procedure: EXPLORATION OF ABDOMIN, REPAIR RECURRENT VENTRAL HERNIA AND SEVERE DIASTASIS RECTI;  Surgeon: Cristine Polio, MD;  Location: [place];  Service: Plastics;  Laterality: N/A;   Family History  Problem Relation Age of Onset  . Hypertension Maternal Grandmother   . Hypertension Maternal Grandfather   . Diabetes Maternal Grandfather   . Stroke Maternal Grandfather   . Hypertension Mother    Social History   Social History  . Marital status: Married    Spouse name: N/A  . Number of children: 3  . Years of education: N/A   Occupational History  . Not on file.   Social History Main Topics  . Smoking status: Never Smoker  . Smokeless tobacco: Never Used  . Alcohol use Yes     Comment: occasionally  . Drug use: No  . Sexual activity: Yes    Birth control/ protection: Surgical   Other Topics Concern  . Not on file   Social History Narrative  . No narrative on file   ROS-see HPI   Negative unless "+" Constitutional:    weight loss,  night sweats, fevers, chills, fatigue, lassitude. HEENT:    headaches, difficulty swallowing, tooth/dental problems, sore throat,       sneezing, itching, ear ache, nasal congestion, post nasal drip, snoring CV:    chest pain, orthopnea, PND, swelling in lower extremities, anasarca,                                                   dizziness, palpitations Resp:   shortness of breath with exertion or at rest.                productive cough,   non-productive cough, coughing up of blood.              change in  color of mucus.  wheezing.   Skin:    rash or lesions. GI:  No-   heartburn, indigestion, abdominal pain, nausea, vomiting, diarrhea,                 change in bowel habits, loss of appetite GU: dysuria, change in color of urine, no urgency or frequency.   flank pain. MS:   joint pain, stiffness, decreased range of motion, back pain. Neuro-     nothing unusual Psych:  change in mood or affect.  depression or anxiety.   memory loss.   OBJ- Physical Exam General- Alert, Oriented, Affect-appropriate, Distress- none acute, + Overweight Skin- rash-none, lesions- none, excoriation- none Lymphadenopathy- none Head- atraumatic            Eyes- Gross vision intact, PERRLA, conjunctivae and secretions clear            Ears- Hearing, canals-normal            Nose- Clear, no-Septal dev, mucus, polyps, erosion, perforation             Throat- Mallampati IV , mucosa clear , drainage- none, tonsils- atrophic Neck- flexible , trachea midline, no stridor , thyroid nl, carotid no bruit Chest - symmetrical excursion , unlabored           Heart/CV- RRR , no murmur , no gallop  , no rub, nl s1 s2                           - JVD- none , edema- none, stasis changes- none, varices- none           Lung- clear to P&A, wheeze- none, cough- none , dullness-none, rub- none           Chest wall-  Abd-  Br/ Gen/ Rectal- Not done, not indicated Extrem- cyanosis- none, clubbing, none, atrophy- none, strength- nl Neuro- grossly intact to observation

## 2016-01-15 NOTE — Patient Instructions (Addendum)
Order- schedule unattended home sleep test  Dx OSA  We will work with you to schedule follow-up here after your sleep test is done.

## 2016-01-16 NOTE — Assessment & Plan Note (Signed)
Being managed by other physicians and not requiring thyroid supplement.

## 2016-01-16 NOTE — Assessment & Plan Note (Signed)
History and exam suggest obstructive sleep apnea is more likely a primary disorder of hypersomnolence. Plan-educated basic sleep hygiene. Responsibility to drive safely. Schedule sleep study.

## 2016-01-22 ENCOUNTER — Encounter: Payer: BLUE CROSS/BLUE SHIELD | Admitting: Internal Medicine

## 2016-01-28 DIAGNOSIS — G4733 Obstructive sleep apnea (adult) (pediatric): Secondary | ICD-10-CM

## 2016-02-06 DIAGNOSIS — G4733 Obstructive sleep apnea (adult) (pediatric): Secondary | ICD-10-CM

## 2016-02-07 ENCOUNTER — Other Ambulatory Visit: Payer: Self-pay | Admitting: *Deleted

## 2016-02-07 DIAGNOSIS — G4733 Obstructive sleep apnea (adult) (pediatric): Secondary | ICD-10-CM

## 2016-02-12 ENCOUNTER — Encounter: Payer: Self-pay | Admitting: Internal Medicine

## 2016-02-12 ENCOUNTER — Ambulatory Visit (INDEPENDENT_AMBULATORY_CARE_PROVIDER_SITE_OTHER): Payer: BLUE CROSS/BLUE SHIELD | Admitting: Internal Medicine

## 2016-02-12 VITALS — BP 122/78 | HR 93 | Ht 66.0 in | Wt 167.4 lb

## 2016-02-12 DIAGNOSIS — G4733 Obstructive sleep apnea (adult) (pediatric): Secondary | ICD-10-CM | POA: Diagnosis not present

## 2016-02-12 NOTE — Patient Instructions (Signed)
Order- new DME new CPAP auto 5-20, mask of choice, humidifier, supplies, AirView  Dx OSA  Please call as needed 

## 2016-02-12 NOTE — Progress Notes (Signed)
01/15/2016-40 year old female never smoker referred courtesy of Dr Jenny Reichmann; Hypersomnolence; never had sleep study. Medical problem list including goiter, HBP, iron deficiency anemia, GERD She says for several years her husband has noted loud snoring and witnessed apneas. She recognizes frequent brief awakenings at night. Comfortable in bed with regular sleep schedule. No ENT surgery and denies history of heart or lung disease. Occasionally sleepy in the daytime. Denies symptoms suggesting cataplexy or sleep paralysis. Treated for hypertension. Weight has gone up 15-20 pounds in the last 2 years.  02/12/2016-40 year old female never smoker followed for OSA, complicated by goiter, HBP, iron deficiency anemia, GERD Unattended Home Sleep Test-01/28/2016-AHI 65.3/hour, desaturation to 40%, body weight 169 pounds. FOLLOWS FOR: Review HST with patient. We discussed treatment for obstructive sleep apnea. Her primary symptom has been persistent daytime sleepiness with some awareness of snoring reported by her husband.  ROS-see HPI   Negative unless "+" Constitutional:    weight loss, night sweats, fevers, chills, +fatigue, lassitude. HEENT:    headaches, difficulty swallowing, tooth/dental problems, sore throat,       sneezing, itching, ear ache, nasal congestion, post nasal drip, snoring CV:    chest pain, orthopnea, PND, swelling in lower extremities, anasarca,                                                   dizziness, palpitations Resp:   shortness of breath with exertion or at rest.                productive cough,   non-productive cough, coughing up of blood.              change in color of mucus.  wheezing.   Skin:    rash or lesions. GI:  No-   heartburn, indigestion, abdominal pain, nausea, vomiting, diarrhea,                 change in bowel habits, loss of appetite GU: dysuria, change in color of urine, no urgency or frequency.   flank pain. MS:   joint pain, stiffness, decreased range of  motion, back pain. Neuro-     nothing unusual Psych:  change in mood or affect.  depression or anxiety.   memory loss.   OBJ- Physical Exam General- Alert, Oriented, Affect-appropriate, Distress- none acute, + Overweight Skin- rash-none, lesions- none, excoriation- none Lymphadenopathy- none Head- atraumatic            Eyes- Gross vision intact, PERRLA, conjunctivae and secretions clear            Ears- Hearing, canals-normal            Nose- Clear, no-Septal dev, mucus, polyps, erosion, perforation             Throat- Mallampati IV , mucosa clear , drainage- none, tonsils- atrophic Neck- flexible , trachea midline, no stridor , thyroid nl, carotid no bruit Chest - symmetrical excursion , unlabored           Heart/CV- RRR , no murmur , no gallop  , no rub, nl s1 s2                           - JVD- none , edema- none, stasis changes- none, varices- none  Lung- clear to P&A, wheeze- none, cough- none , dullness-none, rub- none           Chest wall-  Abd-  Br/ Gen/ Rectal- Not done, not indicated Extrem- cyanosis- none, clubbing, none, atrophy- none, strength- nl Neuro- grossly intact to observation

## 2016-02-12 NOTE — Assessment & Plan Note (Addendum)
Severe obstructive sleep apnea. The primary problem is her pharyngeal anatomy. We talked about alternatives to CPAP but it should be her best choice. Plan-begin new CPAP, new DME company, starting with auto titration. Comfort measures and the importance of communication with her DME company and would thus was emphasized. Compliance goals were reviewed.

## 2016-02-25 ENCOUNTER — Telehealth: Payer: Self-pay | Admitting: Emergency Medicine

## 2016-02-25 DIAGNOSIS — F419 Anxiety disorder, unspecified: Secondary | ICD-10-CM

## 2016-02-25 MED ORDER — ALPRAZOLAM 0.25 MG PO TABS: 0.2500 mg | ORAL_TABLET | Freq: Two times a day (BID) | ORAL | 1 refills | Status: DC | PRN

## 2016-02-25 NOTE — Telephone Encounter (Signed)
Done hardcopy to Corinne  

## 2016-02-25 NOTE — Telephone Encounter (Signed)
Pt called and needs a prescription refill on ALPRAZolam (XANAX) 0.25 MG tablet. Please follow up thanks.

## 2016-02-25 NOTE — Telephone Encounter (Signed)
faxed

## 2016-04-22 HISTORY — PX: BREAST BIOPSY: SHX20

## 2016-04-30 ENCOUNTER — Other Ambulatory Visit: Payer: Self-pay | Admitting: Obstetrics & Gynecology

## 2016-04-30 DIAGNOSIS — R928 Other abnormal and inconclusive findings on diagnostic imaging of breast: Secondary | ICD-10-CM

## 2016-05-08 ENCOUNTER — Ambulatory Visit
Admission: RE | Admit: 2016-05-08 | Discharge: 2016-05-08 | Disposition: A | Discharge status: Home / Self Care | Payer: BLUE CROSS/BLUE SHIELD | Source: Ambulatory Visit | Attending: Obstetrics & Gynecology | Admitting: Obstetrics & Gynecology

## 2016-05-08 ENCOUNTER — Other Ambulatory Visit: Payer: Self-pay | Admitting: Obstetrics & Gynecology

## 2016-05-08 ENCOUNTER — Other Ambulatory Visit: Payer: BLUE CROSS/BLUE SHIELD

## 2016-05-08 DIAGNOSIS — IMO0002 Reserved for concepts with insufficient information to code with codable children: Secondary | ICD-10-CM

## 2016-05-08 DIAGNOSIS — R229 Localized swelling, mass and lump, unspecified: Principal | ICD-10-CM

## 2016-05-08 DIAGNOSIS — R928 Other abnormal and inconclusive findings on diagnostic imaging of breast: Secondary | ICD-10-CM

## 2016-05-12 ENCOUNTER — Other Ambulatory Visit: Payer: Self-pay | Admitting: Obstetrics & Gynecology

## 2016-05-12 ENCOUNTER — Ambulatory Visit
Admission: RE | Admit: 2016-05-12 | Discharge: 2016-05-12 | Disposition: A | Discharge status: Home / Self Care | Payer: BLUE CROSS/BLUE SHIELD | Source: Ambulatory Visit | Attending: Obstetrics & Gynecology | Admitting: Obstetrics & Gynecology

## 2016-05-12 DIAGNOSIS — IMO0002 Reserved for concepts with insufficient information to code with codable children: Secondary | ICD-10-CM

## 2016-05-12 DIAGNOSIS — R229 Localized swelling, mass and lump, unspecified: Principal | ICD-10-CM

## 2016-05-18 ENCOUNTER — Ambulatory Visit: Payer: BLUE CROSS/BLUE SHIELD | Admitting: Internal Medicine

## 2016-05-18 ENCOUNTER — Telehealth: Payer: Self-pay | Admitting: Internal Medicine

## 2016-05-18 NOTE — Telephone Encounter (Signed)
LM for pt x 1  

## 2016-05-18 NOTE — Telephone Encounter (Signed)
Pt is calling wanting to know if there was a specific reason for following up in 3 months or if it was okay to wait until Jan 2018. Last Ov she was told to follow up in 3 months after starting CPAP, which would have been 04/2016.  Pt recently started a new job and she is on 1st shift now and is unable to get off work for an appt at this time. Pt has regular [employer] so there should not be an insurance issue and per [employer] the patient is showing 77% compliance for the last 30 days. I advised patient that she needs to be wearing her CPAP longer at night to increase her overall percentage so that insurance will not kick out coverage. Pt aware that she needs to be wearing her CPAP closer to 6 hours and not 4 hours or less.   Will send to Dr Annamaria Boots for any other recommendations and to advise on the appt.

## 2016-05-18 NOTE — Telephone Encounter (Signed)
Ok to defer return visit until January as requested, as long as her DME company can tell that she is using her CPAP machine as required. Encourage her to call if needed.

## 2016-05-18 NOTE — Telephone Encounter (Signed)
Pt returning call.Vickie Galvan ° °

## 2016-05-18 NOTE — Telephone Encounter (Signed)
Spoke with patient-she is aware that CY is okay with her moving appt to January 2018 as long as she remembers to use her CPAP nightly and documentation is there if insurance needs it prior to then. Nothing more needed at this time.

## 2016-05-21 ENCOUNTER — Encounter: Payer: Self-pay | Admitting: Internal Medicine

## 2016-06-08 ENCOUNTER — Telehealth: Payer: Self-pay | Admitting: *Deleted

## 2016-06-08 DIAGNOSIS — F419 Anxiety disorder, unspecified: Secondary | ICD-10-CM

## 2016-06-08 NOTE — Telephone Encounter (Signed)
Rec'd call pt requesting refill on alprazolam.../lmb 

## 2016-06-09 MED ORDER — ALPRAZOLAM 0.25 MG PO TABS: 0.2500 mg | ORAL_TABLET | Freq: Two times a day (BID) | ORAL | 1 refills | Status: DC | PRN

## 2016-06-09 NOTE — Telephone Encounter (Signed)
faxed

## 2016-06-09 NOTE — Telephone Encounter (Signed)
Done hardcopy to Corinne  

## 2016-06-26 ENCOUNTER — Ambulatory Visit (INDEPENDENT_AMBULATORY_CARE_PROVIDER_SITE_OTHER): Payer: BLUE CROSS/BLUE SHIELD | Admitting: Internal Medicine

## 2016-06-26 ENCOUNTER — Encounter: Payer: Self-pay | Admitting: Internal Medicine

## 2016-06-26 VITALS — BP 126/76 | HR 96 | Ht 66.0 in | Wt 170.8 lb

## 2016-06-26 DIAGNOSIS — J310 Chronic rhinitis: Secondary | ICD-10-CM | POA: Diagnosis not present

## 2016-06-26 DIAGNOSIS — G4733 Obstructive sleep apnea (adult) (pediatric): Secondary | ICD-10-CM | POA: Diagnosis not present

## 2016-06-26 MED ORDER — AZELASTINE-FLUTICASONE 137-50 MCG/ACT NA SUSP: 1.0000 | Freq: Every day | NASAL | 0 refills | Status: DC

## 2016-06-26 NOTE — Patient Instructions (Signed)
We can continue CPAP auto 5-20, mask of chice, humidifier, supplies, AirView/ [employer]      Dx OSA  Try adding a Breathe Right nasal strip on your nose at night before putting on your CPAP. See if it helps the stuffy nose feeling.  Sample Dymista nasal spray     Try 1-2 puffs each nostril once daily at bedtime  Ok to use a decongestant nasal spray like Sinex occasionally, as discussed  Ok to use saline nasal spray as often as needed

## 2016-06-26 NOTE — Assessment & Plan Note (Signed)
Nonspecific nasal congestion with little drainage and no itching or sneezing. She may be at risk for rhinitis medicamentosa although she describes only "occasional" use. Education done. Plan-nasal strip under CPAP mask at night. Sample Dymista nasal spray for trial. Saline nasal spray. Further evaluation if needed.

## 2016-06-26 NOTE — Assessment & Plan Note (Signed)
Good control with auto 5-20 AutoPap/Advanced. We reviewed download explaining importance of compliance goals. She indicates nasal congestion is a barrier better compliance. Suggestions made as above. I think we will be able to continue CPAP successfully. Has met her needs.

## 2016-06-26 NOTE — Progress Notes (Signed)
HPI female never smoker followed for OSA, complicated by goiter, HBP, iron deficiency anemia, GERD Unattended Home Sleep Test-01/28/2016-AHI 65.3/hour, desaturation to 40%, body weight 169 pounds. --------------------------------------------------------------------------------  02/12/2016-41 year old female never smoker followed for OSA, complicated by goiter, HBP, iron deficiency anemia, GERD Unattended Home Sleep Test-01/28/2016-AHI 65.3/hour, desaturation to 40%, body weight 169 pounds. FOLLOWS FOR: Review HST with patient. We discussed treatment for obstructive sleep apnea. Her primary symptom has been persistent daytime sleepiness with some awareness of snoring reported by her husband.  06/26/2016-41 year old female never smoker followed for OSA, complicated by goiter, HBP, iron deficiency anemia, GERD CPAP auto 5-20/ Advanced FOLLOWS FOR: pt states wears cpap avg 4hr nightly, feels pressure & mask are okay. pt states occ she is unable to wear cpap due to her nose being stopped up. DME:[facility] Her initial complaint of daytime sleepiness has resolved when she wears CPAP at husband tells her she no longer snores. She prefers fullface mask. Download review-47% 4 hour compliance (blames nasal congestion), good control AHI 3.0/hour. Pressure usually between 16 and 18 using AutoPap. Has used Flonase in past-doesn't remember result.  Using OTC decongestant nasal spray "occasionally"-discussed.  ROS-see HPI   Negative unless "+" Constitutional:    weight loss, night sweats, fevers, chills, fatigue, lassitude. HEENT:    headaches, difficulty swallowing, tooth/dental problems, sore throat,       sneezing, itching, ear ache,+ nasal congestion, post nasal drip, snoring CV:    chest pain, orthopnea, PND, swelling in lower extremities, anasarca,                                                   dizziness, palpitations Resp:   shortness of breath with exertion or at rest.                productive cough,    non-productive cough, coughing up of blood.              change in color of mucus.  wheezing.   Skin:    rash or lesions. GI:  No-   heartburn, indigestion, abdominal pain, nausea, vomiting, diarrhea,                 change in bowel habits, loss of appetite GU: dysuria, change in color of urine, no urgency or frequency.   flank pain. MS:   joint pain, stiffness, decreased range of motion, back pain. Neuro-     nothing unusual Psych:  change in mood or affect.  depression or anxiety.   memory loss.   OBJ- Physical Exam General- Alert, Oriented, Affect-appropriate, Distress- none acute, + Overweight Skin- rash-none, lesions- none, excoriation- none Lymphadenopathy- none Head- atraumatic            Eyes- Gross vision intact, PERRLA, conjunctivae and secretions clear            Ears- Hearing, canals-normal            Nose- + mild turbinate edema, no-Septal dev, mucus, polyps, erosion, perforation             Throat- Mallampati IV , mucosa clear , drainage- none, tonsils- atrophic Neck- flexible , trachea midline, no stridor , thyroid nl, carotid no bruit Chest - symmetrical excursion , unlabored           Heart/CV- RRR , no murmur , no gallop  ,  no rub, nl s1 s2                           - JVD- none , edema- none, stasis changes- none, varices- none           Lung- clear to P&A, wheeze- none, cough- none , dullness-none, rub- none           Chest wall-  Abd-  Br/ Gen/ Rectal- Not done, not indicated Extrem- cyanosis- none, clubbing, none, atrophy- none, strength- nl Neuro- grossly intact to observation

## 2016-07-13 ENCOUNTER — Encounter: Payer: Self-pay | Admitting: Internal Medicine

## 2016-08-27 ENCOUNTER — Telehealth: Payer: Self-pay | Admitting: *Deleted

## 2016-08-27 DIAGNOSIS — F419 Anxiety disorder, unspecified: Secondary | ICD-10-CM

## 2016-08-27 MED ORDER — ALPRAZOLAM 0.25 MG PO TABS: 0.2500 mg | ORAL_TABLET | Freq: Two times a day (BID) | ORAL | 1 refills | Status: DC | PRN

## 2016-08-27 NOTE — Telephone Encounter (Signed)
Rec'd call pt requesting refills on her alprazolam 0.25 mg...Vickie Galvan

## 2016-08-27 NOTE — Telephone Encounter (Signed)
Done hardcopy to Anna  

## 2016-08-28 NOTE — Telephone Encounter (Signed)
Notified pt rx has been faxed to [facility].../lmb

## 2016-11-13 IMAGING — US US ABDOMEN LIMITED
1 series · 14 of 22 positions shown · non-contrast
Comparison: CT 09/12/2014.

CLINICAL DATA: Abdominal wall mass.  Prior hernia repair [DATE].

EXAM:
LIMITED ABDOMINAL ULTRASOUND

[Series 1: us abdomen limited · 0.08mm/px · 22 acquisitions, 14 frames shown]
[im 1/22]
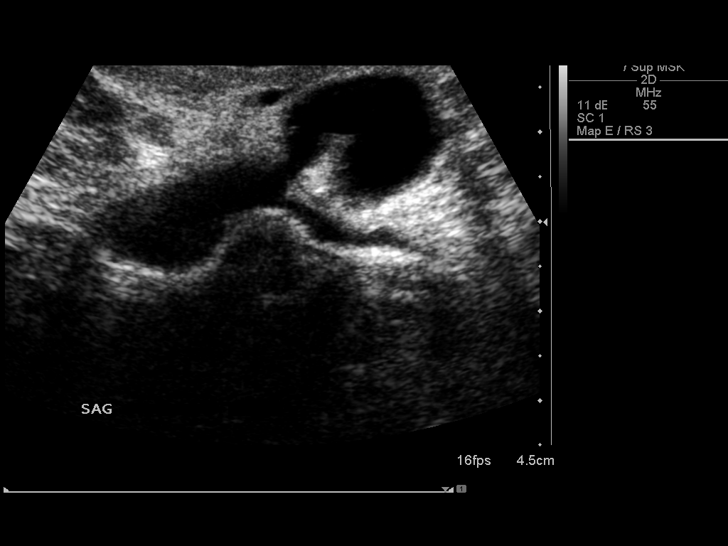
[im 3/22]
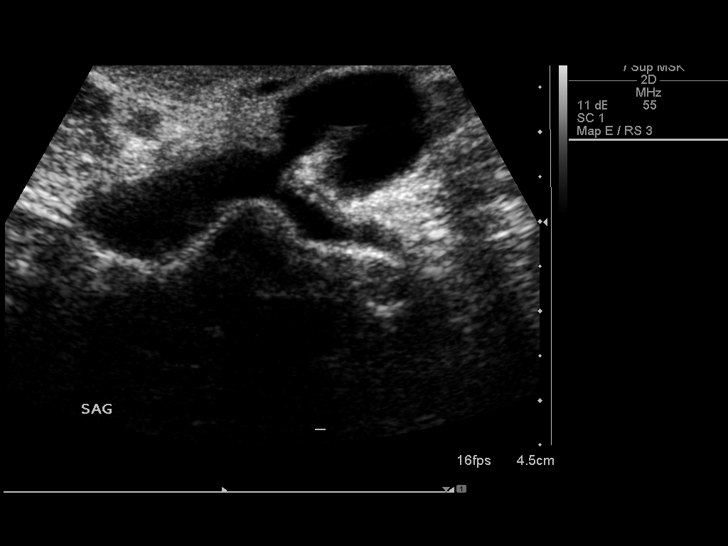
[im 4/22]
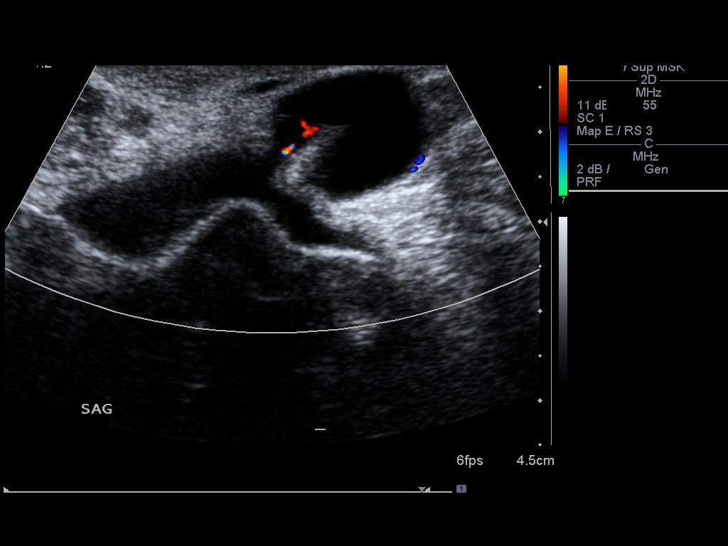
[im 6/22]
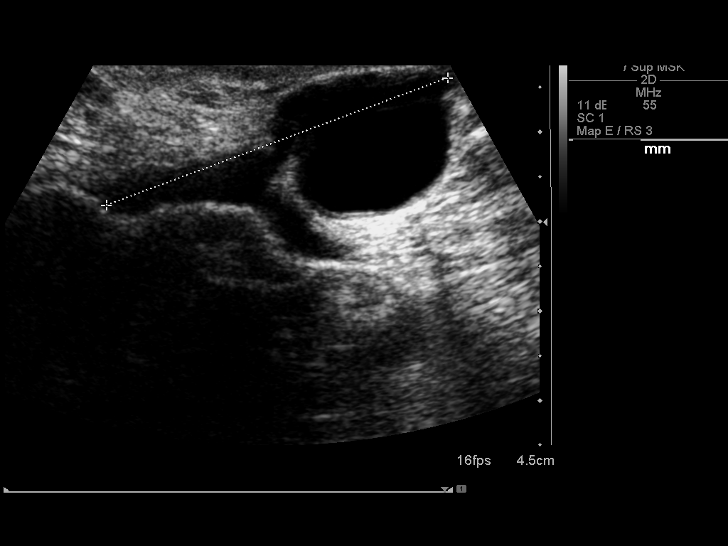
[im 8/22]
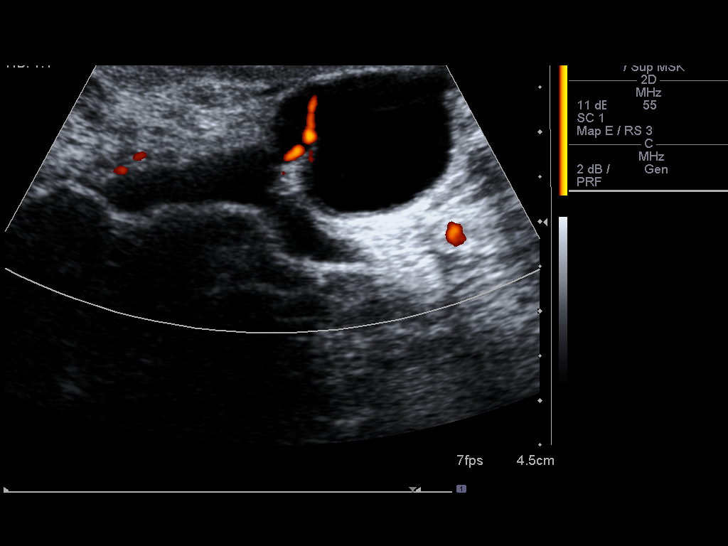
[im 9/22]
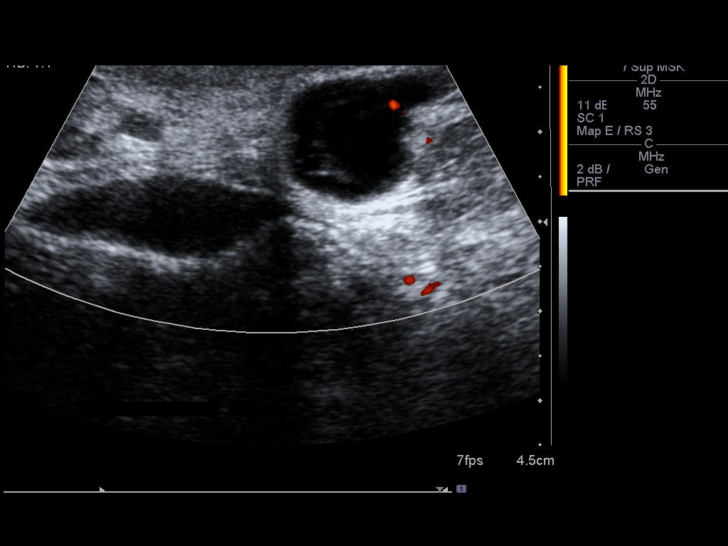
[im 11/22]
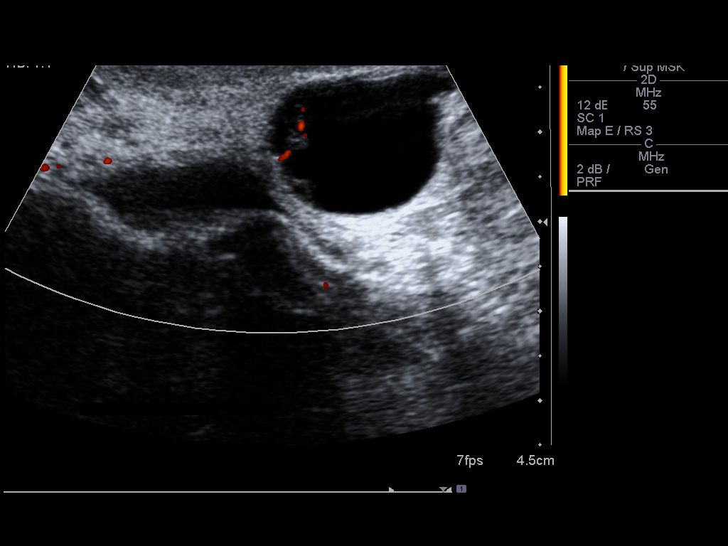
[im 12/22]
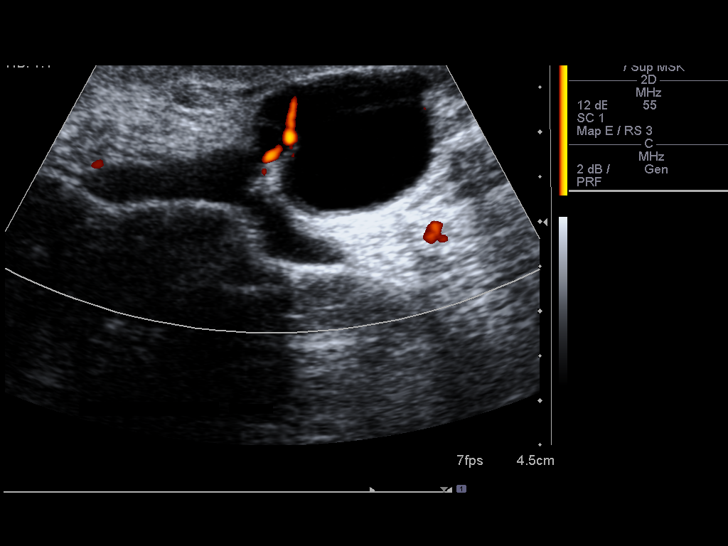
[im 14/22]
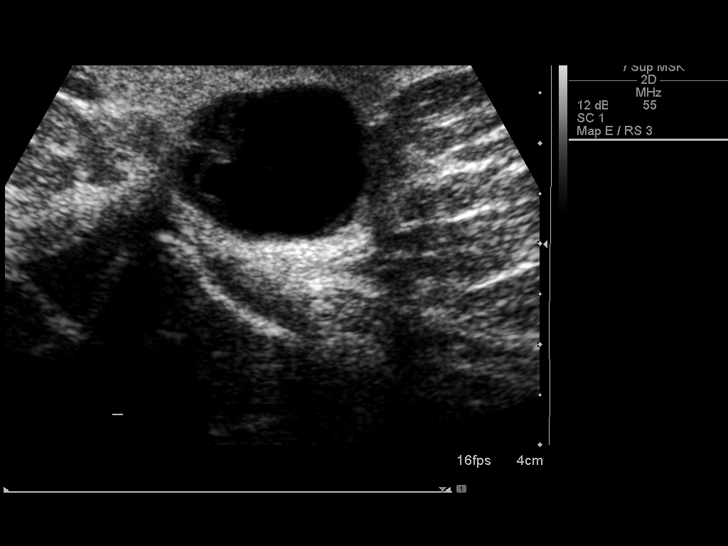
[im 15/22]
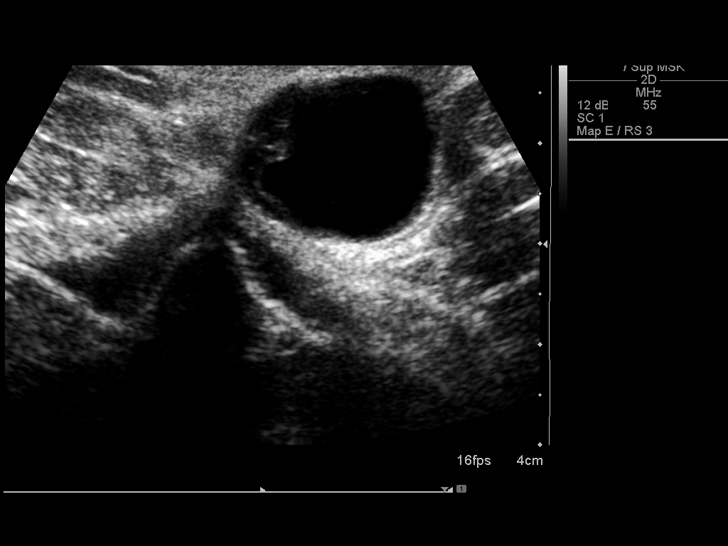
[im 17/22]
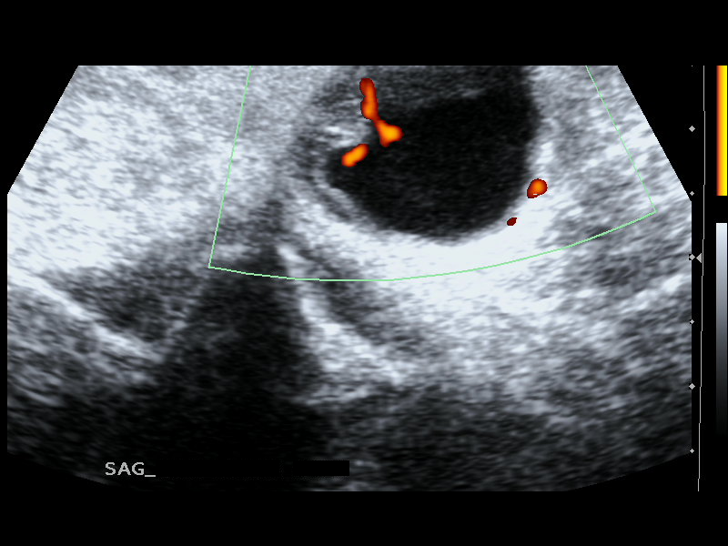
[im 19/22]
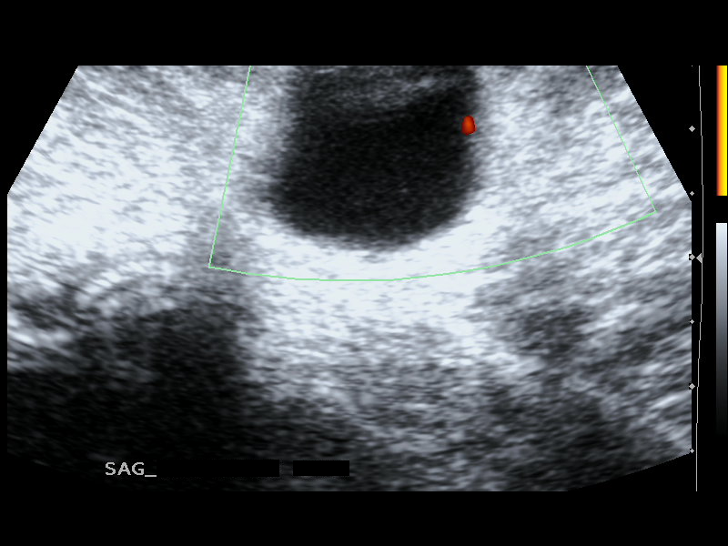
[im 20/22]
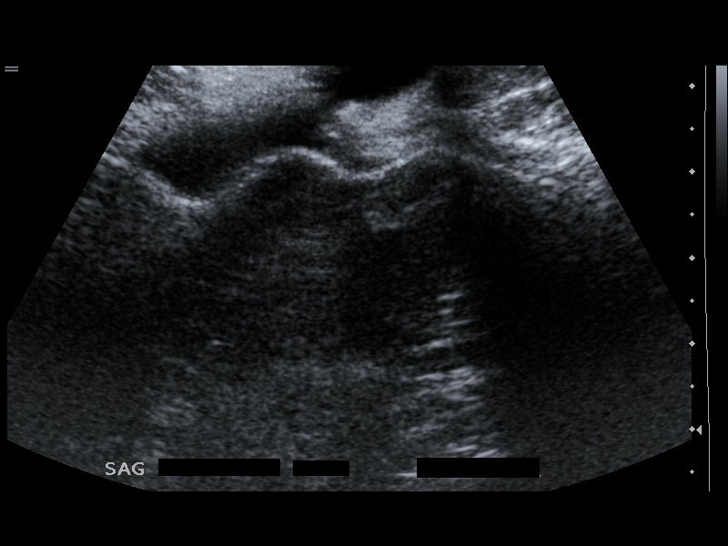
[im 22/22]
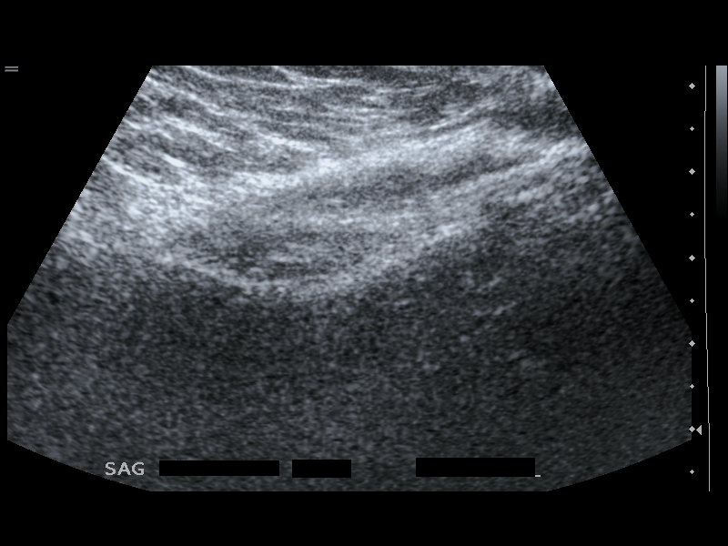

[14 of 22 positions shown; findings below may reference images not displayed]

FINDINGS: Ultrasound was performed in the region of clinical concern. Complex
mass is present in the periumbilical region. Mobile debris and
internal echoes noted within the mass. This suggests the possibility
of date hernia. Abscess, seroma, hematoma cannot be excluded.
IMPRESSION: Findings suggesting recurrent hernia. Oral contrast enhanced CT of
the abdomen and pelvis can be obtained to confirm.

## 2016-12-28 ENCOUNTER — Ambulatory Visit: Payer: BLUE CROSS/BLUE SHIELD | Admitting: Internal Medicine

## 2017-01-25 ENCOUNTER — Other Ambulatory Visit: Payer: Self-pay | Admitting: Internal Medicine

## 2017-02-15 ENCOUNTER — Telehealth: Payer: Self-pay | Admitting: Internal Medicine

## 2017-02-15 DIAGNOSIS — F419 Anxiety disorder, unspecified: Secondary | ICD-10-CM

## 2017-02-15 NOTE — Telephone Encounter (Signed)
Pt called for a refill of all of her meds.  She is mainly focused on her Duanne Moron because she has a funeral this weekend  She is aware she if overdue for her appt did not want to make one at this time because she cannot get off work early  Last CPE 12/2015 Please advise

## 2017-02-15 NOTE — Telephone Encounter (Signed)
Check [place] registry last filled for the alprazolam was  5 /29/2018...Vickie Galvan

## 2017-02-16 MED ORDER — POTASSIUM CHLORIDE ER 10 MEQ PO TBCR: 10.0000 meq | EXTENDED_RELEASE_TABLET | Freq: Every day | ORAL | 0 refills | Status: DC

## 2017-02-16 MED ORDER — ALPRAZOLAM 0.25 MG PO TABS: 0.2500 mg | ORAL_TABLET | Freq: Two times a day (BID) | ORAL | 0 refills | Status: DC | PRN

## 2017-02-16 MED ORDER — LOSARTAN POTASSIUM-HCTZ 100-12.5 MG PO TABS: 1.0000 | ORAL_TABLET | Freq: Every day | ORAL | 0 refills | Status: DC

## 2017-02-16 NOTE — Addendum Note (Signed)
Addended by: Juliet Rude on: 02/16/2017 02:20 PM   Modules accepted: Orders

## 2017-02-16 NOTE — Telephone Encounter (Signed)
Done

## 2017-02-16 NOTE — Telephone Encounter (Signed)
I called pt to make this apt. The first available that she was able to do is Tuesday, Sept 4th. She said that she will be out of her Potassium and Blood Pressure medications and wanted to know if a small supply of this could be sent to [facility].

## 2017-02-16 NOTE — Telephone Encounter (Signed)
Faxed  --please schedule med follow up for pt. Thanks!

## 2017-02-16 NOTE — Telephone Encounter (Signed)
Done hardcopy to [facility]  Please make return office visit for further refills - to call pt

## 2017-02-23 ENCOUNTER — Ambulatory Visit (INDEPENDENT_AMBULATORY_CARE_PROVIDER_SITE_OTHER): Payer: BLUE CROSS/BLUE SHIELD | Admitting: Internal Medicine

## 2017-02-23 ENCOUNTER — Encounter: Payer: Self-pay | Admitting: Internal Medicine

## 2017-02-23 VITALS — BP 126/88 | HR 99 | Temp 98.8°F | Ht 66.0 in | Wt 165.0 lb

## 2017-02-23 DIAGNOSIS — D509 Iron deficiency anemia, unspecified: Secondary | ICD-10-CM

## 2017-02-23 DIAGNOSIS — Z0000 Encounter for general adult medical examination without abnormal findings: Secondary | ICD-10-CM | POA: Diagnosis not present

## 2017-02-23 DIAGNOSIS — F419 Anxiety disorder, unspecified: Secondary | ICD-10-CM | POA: Diagnosis not present

## 2017-02-23 DIAGNOSIS — Z114 Encounter for screening for human immunodeficiency virus [HIV]: Secondary | ICD-10-CM

## 2017-02-23 DIAGNOSIS — R739 Hyperglycemia, unspecified: Secondary | ICD-10-CM | POA: Diagnosis not present

## 2017-02-23 MED ORDER — AZELASTINE-FLUTICASONE 137-50 MCG/ACT NA SUSP: 1.0000 | Freq: Every day | NASAL | 2 refills | Status: DC

## 2017-02-23 MED ORDER — ALPRAZOLAM 0.25 MG PO TABS: 0.2500 mg | ORAL_TABLET | Freq: Two times a day (BID) | ORAL | 0 refills | Status: DC | PRN

## 2017-02-23 MED ORDER — POTASSIUM CHLORIDE ER 10 MEQ PO TBCR: 10.0000 meq | EXTENDED_RELEASE_TABLET | Freq: Every day | ORAL | 3 refills | Status: DC

## 2017-02-23 MED ORDER — LOSARTAN POTASSIUM-HCTZ 100-12.5 MG PO TABS: 1.0000 | ORAL_TABLET | Freq: Every day | ORAL | 3 refills | Status: DC

## 2017-02-23 NOTE — Progress Notes (Signed)
Subjective:    Patient ID: Vickie Galvan, female    DOB: 02/22/1976, 41 y.o.   MRN: [id number]  HPI  Here for wellness and f/u;  Overall doing ok;  Pt denies Chest pain, worsening SOB, DOE, wheezing, orthopnea, PND, worsening LE edema, palpitations, dizziness or syncope.  Pt denies neurological change such as new headache, facial or extremity weakness.  Pt denies polydipsia, polyuria, or low sugar symptoms. Pt states overall good compliance with treatment and medications, good tolerability, and has been trying to follow appropriate diet.  Pt denies worsening depressive symptoms, suicidal ideation or panic. No fever, night sweats, wt loss, loss of appetite, or other constitutional symptoms.  Pt states good ability with ADL's, has low fall risk, home safety reviewed and adequate, no other significant changes in hearing or vision, and only occasionally active with exercise.  Declines flu shot.  Good compliance with CPAP.   Wt Readings from Last 3 Encounters:  02/23/17 165 lb (74.8 kg)  06/26/16 170 lb 12.8 oz (77.5 kg)  02/12/16 167 lb 6.4 oz (75.9 kg)  Has heavy bleeding with iron deficiency but had IUD that unfortunately fell out,hse seen GYN recenlty, being considered for other tx.  Cannot take most iron, but did find a MVI with iron she could take.  Past Medical History:  Diagnosis Date  . Anxiety   . Cough 09/17/2015  . Goiter   . Hypertension    states under control with med., has been on med. x 9 yr.  . Muscle diastasis 08/2015   abd.  . Stuffy and runny nose 09/17/2015   clear drainage from nose, per pt.   Past Surgical History:  Procedure Laterality Date  . ABDOMINOPLASTY Bilateral 09/23/2015   Procedure: EXPLORATION OF ABDOMEN REPAIR OF SCAR AND DIASTASIS ;  Surgeon: Cristine Polio, MD;  Location: [facility];  Service: Plastics;  Laterality: Bilateral;  . CESAREAN SECTION  01/06/2005; 07/02/2006   X 2  . COLONOSCOPY    . INSERTION OF MESH N/A 05/04/2014   Procedure:  INSERTION OF MESH;  Surgeon: Fanny Skates, MD;  Location: [facility];  Service: General;  Laterality: N/A;  . INSERTION OF MESH N/A 05/06/2015   Procedure: INSERTION OF MESH;  Surgeon: Cristine Polio, MD;  Location: [facility];  Service: Plastics;  Laterality: N/A;  umbiical  . RIGHT OOPHORECTOMY  01/06/2005  . TUBAL LIGATION  07/02/2006  . VENTRAL HERNIA REPAIR N/A 05/04/2014   Procedure: LAPAROSCOPIC REPAIR MUTIPLE INCARCERATED VENTRAL HERNIAS;  Surgeon: Fanny Skates, MD;  Location: [facility];  Service: General;  Laterality: N/A;  . VENTRAL HERNIA REPAIR N/A 05/06/2015   Procedure: EXPLORATION OF ABDOMIN, REPAIR RECURRENT VENTRAL HERNIA AND SEVERE DIASTASIS RECTI;  Surgeon: Cristine Polio, MD;  Location: [facility];  Service: Plastics;  Laterality: N/A;    reports that she has never smoked. She has never used smokeless tobacco. She reports that she drinks alcohol. She reports that she does not use drugs. family history includes Diabetes in her maternal grandfather; Hypertension in her maternal grandfather, maternal grandmother, and mother; Stroke in her maternal grandfather. Allergies  Allergen Reactions  . Azithromycin Other (See Comments)    ABD. PAIN  . Cephalexin     Other reaction(s): Abdominal Pain   No current outpatient prescriptions on file prior to visit.   No current facility-administered medications on file prior to visit.    Review of Systems Constitutional: Negative for other unusual diaphoresis, sweats, appetite or weight changes HENT:  Negative for other worsening hearing loss, ear pain, facial swelling, mouth sores or neck stiffness.   Eyes: Negative for other worsening pain, redness or other visual disturbance.  Respiratory: Negative for other stridor or swelling Cardiovascular: Negative for other palpitations or other chest pain  Gastrointestinal: Negative for worsening diarrhea or loose stools, blood in stool, distention or other  pain Genitourinary: Negative for hematuria, flank pain or other change in urine volume.  Musculoskeletal: Negative for myalgias or other joint swelling.  Skin: Negative for other color change, or other wound or worsening drainage.  Neurological: Negative for other syncope or numbness. Hematological: Negative for other adenopathy or swelling Psychiatric/Behavioral: Negative for hallucinations, other worsening agitation, SI, self-injury, or new decreased concentration All other system neg per pt    Objective:   Physical Exam BP 126/88   Pulse 99   Temp 98.8 F (37.1 C) (Oral)   Ht 5\' 6"  (1.676 m)   Wt 165 lb (74.8 kg)   SpO2 99%   BMI 26.63 kg/m  VS noted,  Constitutional: Pt is oriented to person, place, and time. Appears well-developed and well-nourished, in no significant distress and comfortable Head: Normocephalic and atraumatic  Eyes: Conjunctivae and EOM are normal. Pupils are equal, round, and reactive to light Right Ear: External ear normal without discharge Left Ear: External ear normal without discharge Nose: Nose without discharge or deformity Mouth/Throat: Oropharynx is without other ulcerations and moist  Neck: Normal range of motion. Neck supple. No JVD present. No tracheal deviation present or significant neck LA or mass Cardiovascular: Normal rate, regular rhythm, normal heart sounds and intact distal pulses.   Pulmonary/Chest: WOB normal and breath sounds without rales or wheezing  Abdominal: Soft. Bowel sounds are normal. NT. No HSM  Musculoskeletal: Normal range of motion. Exhibits no edema Lymphadenopathy: Has no other cervical adenopathy.  Neurological: Pt is alert and oriented to person, place, and time. Pt has normal reflexes. No cranial nerve deficit. Motor grossly intact, Gait intact Skin: Skin is warm and dry. No rash noted or new ulcerations Psychiatric:  Has normal mood and affect. Behavior is normal without agitation No other exam findings        Assessment & Plan:

## 2017-02-23 NOTE — Assessment & Plan Note (Signed)
Asympt,  Lab Results  Component Value Date   HGBA1C 5.9 01/02/2016  stable overall by history and exam, recent data reviewed with pt, and pt to continue medical treatment as before,  to f/u any worsening symptoms or concerns=

## 2017-02-23 NOTE — Patient Instructions (Signed)

## 2017-02-23 NOTE — Assessment & Plan Note (Signed)

## 2017-02-23 NOTE — Assessment & Plan Note (Signed)
Also for iron level with labs 

## 2017-02-23 NOTE — Assessment & Plan Note (Signed)
Stable, for med refill 

## 2017-02-24 ENCOUNTER — Telehealth: Payer: Self-pay

## 2017-02-24 NOTE — Telephone Encounter (Signed)
Key: [facility] Approved through 02/24/18

## 2017-03-16 ENCOUNTER — Other Ambulatory Visit: Payer: Self-pay | Admitting: Internal Medicine

## 2017-04-12 ENCOUNTER — Other Ambulatory Visit: Payer: Self-pay | Admitting: Internal Medicine

## 2017-04-12 NOTE — Telephone Encounter (Signed)
Faxed

## 2017-04-12 NOTE — Telephone Encounter (Signed)
[place] controlled substance database checked.  Ok to fill medication. Printed

## 2017-09-22 ENCOUNTER — Other Ambulatory Visit: Payer: Self-pay | Admitting: Internal Medicine

## 2017-09-22 MED ORDER — TELMISARTAN-HCTZ 80-12.5 MG PO TABS: 1.0000 | ORAL_TABLET | Freq: Every day | ORAL | 3 refills | Status: DC

## 2017-09-22 MED ORDER — ALPRAZOLAM 0.25 MG PO TABS: ORAL_TABLET | ORAL | 0 refills | Status: DC

## 2017-09-22 NOTE — Addendum Note (Signed)
Addended by: Biagio Borg on: 09/22/2017 01:08 PM   Modules accepted: Orders

## 2017-09-22 NOTE — Telephone Encounter (Signed)
Notified pt w/ MD response. Pt also states she is needing refill on her alprazolam sent to same pharmacy.Marland KitchenJohny Chess

## 2017-09-22 NOTE — Telephone Encounter (Signed)
Ok done erx 

## 2017-09-22 NOTE — Telephone Encounter (Signed)
Copied from [place] [id number]. Topic: Quick Communication - Rx Refill/Question >> Sep 22, 2017  9:11 AM Ahmed Prima L wrote: Medication: losartan-hydrochlorothiazide (HYZAAR) 100-12.5 MG tablet Has the patient contacted their pharmacy? Yes a few days ago and they just told her that it was on back order and that she needed to contact her PCP for a diff script. She is currently out bc she was waiting on them (Agent: If no, request that the patient contact the pharmacy for the refill.) Preferred Pharmacy (with phone number or street name): [id number] - [place], [place] Agent: Please be advised that RX refills may take up to 3 business days. We ask that you follow-up with your pharmacy.  Also, she needs ALPRAZolam (XANAX) 0.25 MG tablet  Please call patient as she wants to know what is going to be sent in in place of the Losartan.

## 2017-09-22 NOTE — Telephone Encounter (Signed)
Ok to change to micardis HCT which is very similar - done erx

## 2017-12-29 ENCOUNTER — Other Ambulatory Visit: Payer: Self-pay | Admitting: Internal Medicine

## 2017-12-29 MED ORDER — ALPRAZOLAM 0.25 MG PO TABS: ORAL_TABLET | ORAL | 0 refills | Status: DC

## 2017-12-29 NOTE — Telephone Encounter (Signed)
LOV 02/23/17 Dr. Jenny Reichmann Last refill 09/22/17  # 60 with 0 refill

## 2017-12-29 NOTE — Telephone Encounter (Signed)
Done erx 

## 2017-12-29 NOTE — Telephone Encounter (Signed)
Copied from [place] [id number]. Topic: Quick Communication - Rx Refill/Question >> Dec 29, 2017 11:01 AM Vickie Galvan R wrote: Medication: ALPRAZolam Duanne Moron) 0.25 MG tablet  Has the patient contacted their pharmacy? Yes (Agent: If no, request that the patient contact the pharmacy for the refill.) (Agent: If yes, when and what did the pharmacy advise?)  Preferred Pharmacy (with phone number or street name): [id number] [place], [place] [phone] (Phone) [phone] (Fax)      Agent: Please be advised that RX refills may take up to 3 business days. We ask that you follow-up with your pharmacy.

## 2018-02-12 ENCOUNTER — Other Ambulatory Visit: Payer: Self-pay | Admitting: Internal Medicine

## 2018-02-14 NOTE — Telephone Encounter (Signed)
Done erx 

## 2018-04-23 ENCOUNTER — Other Ambulatory Visit: Payer: Self-pay | Admitting: Internal Medicine

## 2018-06-09 ENCOUNTER — Encounter: Payer: BLUE CROSS/BLUE SHIELD | Admitting: Internal Medicine

## 2018-07-13 ENCOUNTER — Encounter: Payer: Self-pay | Admitting: Internal Medicine

## 2018-08-03 ENCOUNTER — Other Ambulatory Visit: Payer: Self-pay | Admitting: Internal Medicine

## 2018-08-18 ENCOUNTER — Other Ambulatory Visit: Payer: Self-pay | Admitting: Obstetrics & Gynecology

## 2018-08-18 DIAGNOSIS — R928 Other abnormal and inconclusive findings on diagnostic imaging of breast: Secondary | ICD-10-CM

## 2018-08-21 HISTORY — PX: BREAST BIOPSY: SHX20

## 2018-08-23 ENCOUNTER — Other Ambulatory Visit: Payer: Self-pay | Admitting: Obstetrics & Gynecology

## 2018-08-23 ENCOUNTER — Ambulatory Visit
Admission: RE | Admit: 2018-08-23 | Discharge: 2018-08-23 | Disposition: A | Discharge status: Home / Self Care | Payer: Commercial Managed Care - PPO | Source: Ambulatory Visit | Attending: Obstetrics & Gynecology | Admitting: Obstetrics & Gynecology

## 2018-08-23 DIAGNOSIS — R921 Mammographic calcification found on diagnostic imaging of breast: Secondary | ICD-10-CM

## 2018-08-23 DIAGNOSIS — R928 Other abnormal and inconclusive findings on diagnostic imaging of breast: Secondary | ICD-10-CM

## 2018-08-25 ENCOUNTER — Ambulatory Visit
Admission: RE | Admit: 2018-08-25 | Discharge: 2018-08-25 | Disposition: A | Discharge status: Home / Self Care | Payer: Commercial Managed Care - PPO | Source: Ambulatory Visit | Attending: Obstetrics & Gynecology | Admitting: Obstetrics & Gynecology

## 2018-08-25 DIAGNOSIS — R921 Mammographic calcification found on diagnostic imaging of breast: Secondary | ICD-10-CM

## 2018-08-25 LAB — HM MAMMOGRAPHY

## 2018-09-03 ENCOUNTER — Other Ambulatory Visit: Payer: Self-pay

## 2018-09-03 ENCOUNTER — Ambulatory Visit
Admission: EM | Admit: 2018-09-03 | Discharge: 2018-09-03 | Disposition: A | Discharge status: Home / Self Care | Payer: Commercial Managed Care - PPO | Attending: Physician Assistant | Admitting: Physician Assistant

## 2018-09-03 DIAGNOSIS — J020 Streptococcal pharyngitis: Secondary | ICD-10-CM | POA: Diagnosis not present

## 2018-09-03 LAB — POCT RAPID STREP A (OFFICE): Rapid Strep A Screen: POSITIVE — AB

## 2018-09-03 MED ORDER — AMOXICILLIN 500 MG PO CAPS: 500.0000 mg | ORAL_CAPSULE | Freq: Two times a day (BID) | ORAL | 0 refills | Status: DC

## 2018-09-03 MED ORDER — LIDOCAINE VISCOUS HCL 2 % MT SOLN: OROMUCOSAL | 0 refills | Status: DC

## 2018-09-03 NOTE — ED Triage Notes (Signed)
Pt has been having cough, congestion and sore throat for about 1 week. Fatigue and weakness

## 2018-09-03 NOTE — ED Provider Notes (Signed)
[facility]    CSN: [id number] Arrival date & time: 09/03/18  1530     History   Chief Complaint Chief Complaint  Patient presents with  . Sore Throat    HPI Vickie Galvan is a 43 y.o. female.   43 year old female comes in for 3-day history of URI symptoms.  Has had rhinorrhea, nasal congestion, postnasal drip, cough.  She has mild sneezing, right ear pain.  States has sore throat that has improved today.  Denies fever, chills, night sweats.  Denies trouble swallowing, trouble breathing, swelling of the throat, tripoding, drooling, trismus.  Thought it was due to allergies, so started to use her Flonase.  Never smoker.  Husband and daughter with similar symptoms.     Past Medical History:  Diagnosis Date  . Anxiety   . Cough 09/17/2015  . Goiter   . Hypertension    states under control with med., has been on med. x 9 yr.  . Muscle diastasis 08/2015   abd.  . Stuffy and runny nose 09/17/2015   clear drainage from nose, per pt.    Patient Active Problem List   Diagnosis Date Noted  . Rhinitis, chronic 06/26/2016  . Obstructive sleep apnea 01/02/2016  . Hyperglycemia 01/02/2016  . Ventral hernia 05/04/2014  . Epigastric hernia 01/30/2014  . Umbilical hernia 08/65/7846  . Preventative health care 05/04/2011  . Rash 05/04/2011  . Environmental allergies 10/27/2010  . Anxiety 10/27/2010  . Cervicalgia 08/20/2010  . GOITER, MULTINODULAR 05/13/2009  . MENORRHAGIA 03/01/2009  . FATIGUE 03/01/2009  . ANEMIA, IRON DEFICIENCY 08/03/2008    Past Surgical History:  Procedure Laterality Date  . ABDOMINOPLASTY Bilateral 09/23/2015   Procedure: EXPLORATION OF ABDOMEN REPAIR OF SCAR AND DIASTASIS ;  Surgeon: Cristine Polio, MD;  Location: [facility];  Service: Plastics;  Laterality: Bilateral;  . CESAREAN SECTION  01/06/2005; 07/02/2006   X 2  . COLONOSCOPY    . INSERTION OF MESH N/A 05/04/2014   Procedure: INSERTION OF MESH;  Surgeon:  Fanny Skates, MD;  Location: [facility];  Service: General;  Laterality: N/A;  . INSERTION OF MESH N/A 05/06/2015   Procedure: INSERTION OF MESH;  Surgeon: Cristine Polio, MD;  Location: [facility];  Service: Plastics;  Laterality: N/A;  umbiical  . RIGHT OOPHORECTOMY  01/06/2005  . TUBAL LIGATION  07/02/2006  . VENTRAL HERNIA REPAIR N/A 05/04/2014   Procedure: LAPAROSCOPIC REPAIR MUTIPLE INCARCERATED VENTRAL HERNIAS;  Surgeon: Fanny Skates, MD;  Location: [facility];  Service: General;  Laterality: N/A;  . VENTRAL HERNIA REPAIR N/A 05/06/2015   Procedure: EXPLORATION OF ABDOMIN, REPAIR RECURRENT VENTRAL HERNIA AND SEVERE DIASTASIS RECTI;  Surgeon: Cristine Polio, MD;  Location: [facility];  Service: Plastics;  Laterality: N/A;    OB History   No obstetric history on file.      Home Medications    Prior to Admission medications   Medication Sig Start Date End Date Taking? Authorizing Provider  ALPRAZolam Duanne Moron) 0.25 MG tablet TAKE 1 TABLET BY MOUTH TWICE A DAY AS NEEDED ANXIETY 02/14/18   Biagio Borg, MD  amoxicillin (AMOXIL) 500 MG capsule Take 1 capsule (500 mg total) by mouth 2 (two) times daily. 09/03/18   Tasia Catchings, Amy V, PA-C  Azelastine-Fluticasone (DYMISTA) 137-50 MCG/ACT SUSP Place 1 puff into the nose daily. 02/23/17 02/24/17  Biagio Borg, MD  lidocaine (XYLOCAINE) 2 % solution 5-15 mL to gurgle as needed. 09/03/18   Ok Edwards, PA-C  potassium chloride (K-DUR) 10 MEQ tablet Take 1 tablet (10 mEq total) by mouth daily. 02/23/17   Biagio Borg, MD  potassium chloride (K-DUR) 10 MEQ tablet TAKE 1 TABLET BY MOUTH EVERY DAY 04/25/18   Biagio Borg, MD  telmisartan-hydrochlorothiazide (MICARDIS HCT) 80-12.5 MG tablet TAKE 1 TABLET BY MOUTH EVERY DAY 08/03/18   Biagio Borg, MD    Family History Family History  Problem Relation Age of Onset  . Hypertension Maternal Grandmother   . Hypertension Maternal Grandfather   . Diabetes Maternal Grandfather   . Stroke  Maternal Grandfather   . Hypertension Mother     Social History Social History   Tobacco Use  . Smoking status: Never Smoker  . Smokeless tobacco: Never Used  Substance Use Topics  . Alcohol use: Yes    Comment: occasionally  . Drug use: No     Allergies   Azithromycin and Cephalexin   Review of Systems Review of Systems  Reason unable to perform ROS: See HPI as above.     Physical Exam Triage Vital Signs ED Triage Vitals  Enc Vitals Group     BP      Pulse      Resp      Temp      Temp src      SpO2      Weight      Height      Head Circumference      Peak Flow      Pain Score      Pain Loc      Pain Edu?      Excl. in [place]?    No data found.  Updated Vital Signs BP (!) 143/97 (BP Location: Right Arm)   Pulse 96   Temp 98 F (36.7 C) (Oral)   Resp 18   SpO2 96%   Physical Exam Constitutional:      General: She is not in acute distress.    Appearance: She is well-developed. She is not ill-appearing, toxic-appearing or diaphoretic.  HENT:     Head: Normocephalic and atraumatic.     Right Ear: Tympanic membrane, ear canal and external ear normal. Tympanic membrane is not erythematous or bulging.     Left Ear: Tympanic membrane, ear canal and external ear normal. Tympanic membrane is not erythematous or bulging.     Nose: Nose normal.     Right Sinus: No maxillary sinus tenderness or frontal sinus tenderness.     Left Sinus: No maxillary sinus tenderness or frontal sinus tenderness.     Mouth/Throat:     Mouth: Mucous membranes are moist.     Pharynx: Oropharynx is clear. Uvula midline. Posterior oropharyngeal erythema present.     Tonsils: No tonsillar exudate. Swelling: 2+ on the right. 2+ on the left.  Eyes:     Conjunctiva/sclera: Conjunctivae normal.     Pupils: Pupils are equal, round, and reactive to light.  Neck:     Musculoskeletal: Normal range of motion and neck supple.  Cardiovascular:     Rate and Rhythm: Normal rate and regular  rhythm.     Heart sounds: Normal heart sounds. No murmur. No friction rub. No gallop.   Pulmonary:     Effort: Pulmonary effort is normal. No accessory muscle usage, prolonged expiration, respiratory distress or retractions.     Breath sounds: Normal breath sounds. No stridor, decreased air movement or transmitted upper airway sounds. No decreased breath sounds, wheezing, rhonchi or rales.  Skin:    General: Skin is warm and dry.  Neurological:     Mental Status: She is alert and oriented to person, place, and time.      UC Treatments / Results  Labs (all labs ordered are listed, but only abnormal results are displayed) Labs Reviewed  POCT RAPID STREP A (OFFICE) - Abnormal; Notable for the following components:      Result Value   Rapid Strep A Screen Positive (*)    All other components within normal limits    EKG None  Radiology No results found.  Procedures Procedures (including critical care time)  Medications Ordered in UC Medications - No data to display  Initial Impression / Assessment and Plan / UC Course  I have reviewed the triage vital signs and the nursing notes.  Pertinent labs & imaging results that were available during my care of the patient were reviewed by me and considered in my medical decision making (see chart for details).    Rapid strep positive. Start antibiotic as directed. Symptomatic treatment as needed. Return precautions given.   Final Clinical Impressions(s) / UC Diagnoses   Final diagnoses:  Streptococcal sore throat    ED Prescriptions    Medication Sig Dispense Auth. Provider   amoxicillin (AMOXIL) 500 MG capsule Take 1 capsule (500 mg total) by mouth 2 (two) times daily. 20 capsule Yu, Amy V, PA-C   lidocaine (XYLOCAINE) 2 % solution 5-15 mL to gurgle as needed. 150 mL Tobin Chad, [place] 09/03/18 1628

## 2018-09-03 NOTE — Discharge Instructions (Signed)
Rapid strep positive. Start amoxicillin as directed. Tylenol/Motrin for fever and pain. Monitor for any worsening of symptoms, trouble breathing, trouble swallowing, swelling of the throat, leaning forward to breath, drooling, follow up here or at the emergency department for reevaluation.   Start lidocaine for sore throat, do not eat or drink for the next 40 mins after use as it can stunt your gag reflex.

## 2019-01-23 ENCOUNTER — Other Ambulatory Visit: Payer: Self-pay | Admitting: Obstetrics & Gynecology

## 2019-01-23 DIAGNOSIS — N63 Unspecified lump in unspecified breast: Secondary | ICD-10-CM

## 2019-02-22 ENCOUNTER — Other Ambulatory Visit: Payer: Self-pay | Admitting: Obstetrics & Gynecology

## 2019-02-22 ENCOUNTER — Other Ambulatory Visit: Payer: Self-pay

## 2019-02-22 ENCOUNTER — Ambulatory Visit
Admission: RE | Admit: 2019-02-22 | Discharge: 2019-02-22 | Disposition: A | Discharge status: Home / Self Care | Payer: BC Managed Care – PPO | Source: Ambulatory Visit | Attending: Obstetrics & Gynecology | Admitting: Obstetrics & Gynecology

## 2019-02-22 DIAGNOSIS — R921 Mammographic calcification found on diagnostic imaging of breast: Secondary | ICD-10-CM

## 2019-02-22 DIAGNOSIS — N63 Unspecified lump in unspecified breast: Secondary | ICD-10-CM

## 2019-02-22 LAB — HM MAMMOGRAPHY

## 2019-03-28 ENCOUNTER — Ambulatory Visit
Admission: EM | Admit: 2019-03-28 | Discharge: 2019-03-28 | Disposition: A | Discharge status: Home / Self Care | Payer: BC Managed Care – PPO | Attending: Emergency Medicine | Admitting: Emergency Medicine

## 2019-03-28 DIAGNOSIS — I10 Essential (primary) hypertension: Secondary | ICD-10-CM | POA: Diagnosis not present

## 2019-03-28 LAB — POCT RAPID STREP A (OFFICE): Rapid Strep A Screen: NEGATIVE

## 2019-03-28 NOTE — ED Triage Notes (Signed)
Pt here to be tested for step. Her husband was tested positive. Pt denies sx's

## 2019-03-28 NOTE — Discharge Instructions (Addendum)
Strep culture pending, will call you if positive and treat appropriately if indicated.

## 2019-03-28 NOTE — ED Provider Notes (Signed)
[facility]    CSN: [id number] Arrival date & time: 03/28/19  1830      History   Chief Complaint Chief Complaint  Patient presents with  . Sore Throat    HPI Vickie Galvan is a 43 y.o. female with history of anxiety, goiter, hypertension presenting for strep test.  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  Chart review showing patient treated for positive strep in March 2020 at this facility.  Patient denies symptoms, wanted to make sure.  Of note, patient's blood pressure is elevated: States she is not a taking her blood pressure medication.  Denies cost being contributory.  States "I have been stressed lately ".  Denies SI/HI.  Has Xanax at home from her PCP.    Past Medical History:  Diagnosis Date  . Anxiety   . Cough 09/17/2015  . Goiter   . Hypertension    states under control with med., has been on med. x 9 yr.  . Muscle diastasis 08/2015   abd.  . Stuffy and runny nose 09/17/2015   clear drainage from nose, per pt.    Patient Active Problem List   Diagnosis Date Noted  . Rhinitis, chronic 06/26/2016  . Obstructive sleep apnea 01/02/2016  . Hyperglycemia 01/02/2016  . Ventral hernia 05/04/2014  . Epigastric hernia 01/30/2014  . Umbilical hernia [id number]  . Preventative health care 05/04/2011  . Rash 05/04/2011  . Environmental allergies 10/27/2010  . Anxiety 10/27/2010  . Cervicalgia 08/20/2010  . GOITER, MULTINODULAR 05/13/2009  . MENORRHAGIA 03/01/2009  . FATIGUE 03/01/2009  . ANEMIA, IRON DEFICIENCY 08/03/2008    Past Surgical History:  Procedure Laterality Date  . ABDOMINOPLASTY Bilateral 09/23/2015   Procedure: EXPLORATION OF ABDOMEN REPAIR OF SCAR AND DIASTASIS ;  Surgeon: Cristine Polio, MD;  Location: [place];  Service: Plastics;  Laterality: Bilateral;  . CESAREAN SECTION  01/06/2005; 07/02/2006   X 2  . COLONOSCOPY    . INSERTION OF MESH N/A 05/04/2014   Procedure: INSERTION OF MESH;  Surgeon: Fanny Skates, MD;  Location: [place];  Service: General;  Laterality: N/A;  . INSERTION OF MESH N/A 05/06/2015   Procedure: INSERTION OF MESH;  Surgeon: Cristine Polio, MD;  Location: [place];  Service: Plastics;  Laterality: N/A;  umbiical  . RIGHT OOPHORECTOMY  01/06/2005  . TUBAL LIGATION  07/02/2006  . VENTRAL HERNIA REPAIR N/A 05/04/2014   Procedure: LAPAROSCOPIC REPAIR MUTIPLE INCARCERATED VENTRAL HERNIAS;  Surgeon: Fanny Skates, MD;  Location: [place];  Service: General;  Laterality: N/A;  . VENTRAL HERNIA REPAIR N/A 05/06/2015   Procedure: EXPLORATION OF ABDOMIN, REPAIR RECURRENT VENTRAL HERNIA AND SEVERE DIASTASIS RECTI;  Surgeon: Cristine Polio, MD;  Location: [place];  Service: Plastics;  Laterality: N/A;    OB History   No obstetric history on file.      Home Medications    Prior to Admission medications   Medication Sig Start Date End Date Taking? Authorizing Provider  ALPRAZolam Duanne Moron) 0.25 MG tablet TAKE 1 TABLET BY MOUTH TWICE A DAY AS NEEDED ANXIETY 02/14/18   Biagio Borg, MD  potassium chloride (K-DUR) 10 MEQ tablet Take 1 tablet (10 mEq total) by mouth daily. 02/23/17   Biagio Borg, MD  potassium chloride (K-DUR) 10 MEQ tablet TAKE 1 TABLET BY MOUTH EVERY DAY 04/25/18   Biagio Borg, MD  telmisartan-hydrochlorothiazide (MICARDIS HCT) 80-12.5 MG tablet TAKE 1 TABLET  BY MOUTH EVERY DAY 08/03/18   Biagio Borg, MD    Family History Family History  Problem Relation Age of Onset  . Hypertension Maternal Grandmother   . Hypertension Maternal Grandfather   . Diabetes Maternal Grandfather   . Stroke Maternal Grandfather   . Hypertension Mother     Social History Social History   Tobacco Use  . Smoking status: Never Smoker  . Smokeless tobacco: Never Used  Substance Use Topics  . Alcohol use: Yes    Comment: occasionally  . Drug use: No     Allergies   Azithromycin and  Cephalexin   Review of Systems Review of Systems  Constitutional: Negative for fatigue and fever.  HENT: Negative for ear pain, sinus pain, sore throat and voice change.   Eyes: Negative for pain, redness and visual disturbance.  Respiratory: Negative for cough and shortness of breath.   Cardiovascular: Negative for chest pain and palpitations.  Gastrointestinal: Negative for abdominal pain, diarrhea and vomiting.  Musculoskeletal: Negative for arthralgias and myalgias.  Skin: Negative for rash and wound.  Neurological: Negative for syncope and headaches.     Physical Exam Triage Vital Signs ED Triage Vitals  Enc Vitals Group     BP 03/28/19 1850 (!) 161/106     Pulse Rate 03/28/19 1850 97     Resp 03/28/19 1850 18     Temp 03/28/19 1850 98.8 F (37.1 C)     Temp Source 03/28/19 1850 Oral     SpO2 03/28/19 1850 97 %     Weight --      Height --      Head Circumference --      Peak Flow --      Pain Score 03/28/19 1851 0     Pain Loc --      Pain Edu? --      Excl. in [place]? --    No data found.  Updated Vital Signs BP (!) 161/106 (BP Location: Left Arm)   Pulse 97   Temp 98.8 F (37.1 C) (Oral)   Resp 18   LMP 03/14/2019   SpO2 97%   Visual Acuity Right Eye Distance:   Left Eye Distance:   Bilateral Distance:    Right Eye Near:   Left Eye Near:    Bilateral Near:     Physical Exam Constitutional:      General: She is not in acute distress.    Appearance: She is obese. She is not ill-appearing.  HENT:     Head: Normocephalic and atraumatic.     Jaw: There is normal jaw occlusion. No tenderness or pain on movement.     Right Ear: Hearing, tympanic membrane, ear canal and external ear normal. No tenderness. No mastoid tenderness.     Left Ear: Hearing, tympanic membrane, ear canal and external ear normal. No tenderness. No mastoid tenderness.     Nose: No nasal deformity, septal deviation or nasal tenderness.     Right Turbinates: Not swollen or pale.      Left Turbinates: Not swollen or pale.     Right Sinus: No maxillary sinus tenderness or frontal sinus tenderness.     Left Sinus: No maxillary sinus tenderness or frontal sinus tenderness.     Mouth/Throat:     Lips: Pink. No lesions.     Mouth: Mucous membranes are moist. No injury.     Pharynx: Oropharynx is clear. Uvula midline. No posterior oropharyngeal erythema or uvula swelling.  Tonsils: No tonsillar exudate. 2+ on the right. 2+ on the left.  Eyes:     Conjunctiva/sclera: Conjunctivae normal.     Pupils: Pupils are equal, round, and reactive to light.  Neck:     Musculoskeletal: Normal range of motion and neck supple. No muscular tenderness.  Cardiovascular:     Rate and Rhythm: Normal rate and regular rhythm.     Heart sounds: No murmur. No gallop.   Pulmonary:     Effort: Pulmonary effort is normal. No respiratory distress.     Breath sounds: No wheezing.  Lymphadenopathy:     Cervical: No cervical adenopathy.  Skin:    Capillary Refill: Capillary refill takes less than 2 seconds.  Neurological:     Mental Status: She is alert and oriented to person, place, and time.      UC Treatments / Results  Labs (all labs ordered are listed, but only abnormal results are displayed) Labs Reviewed  CULTURE, GROUP A STREP [facility])  POCT RAPID STREP A (OFFICE)    EKG   Radiology No results found.  Procedures Procedures (including critical care time)  Medications Ordered in UC Medications - No data to display  Initial Impression / Assessment and Plan / UC Course  I have reviewed the triage vital signs and the nursing notes.  Pertinent labs & imaging results that were available during my care of the patient were reviewed by me and considered in my medical decision making (see chart for details).     1.  Elevated blood pressure reading with diagnosis of hypertension Patient asymptomatic, had not yet taken her blood pressure medications.  Also endorsing social  stressors: Denies SI/HI.  Has chronic benzodiazepine use provided by PCP: No refills provided.  Rapid strep done in office per patient's request due to exposure, reviewed by me: Negative.  Culture pending.  Return precautions discussed, patient verbalized understanding and is agreeable to plan. Final Clinical Impressions(s) / UC Diagnoses   Final diagnoses:  Elevated blood pressure reading with diagnosis of hypertension     Discharge Instructions     Strep culture pending, will call you if positive and treat appropriately if indicated.    ED Prescriptions    None     PDMP not reviewed this encounter.   Neldon Mc Needham, [place] 03/28/19 1919

## 2019-04-01 LAB — CULTURE, GROUP A STREP (THRC)

## 2019-05-29 DIAGNOSIS — L738 Other specified follicular disorders: Secondary | ICD-10-CM | POA: Diagnosis not present

## 2019-07-10 ENCOUNTER — Encounter: Payer: Self-pay | Admitting: Emergency Medicine

## 2019-07-10 ENCOUNTER — Ambulatory Visit
Admission: EM | Admit: 2019-07-10 | Discharge: 2019-07-10 | Disposition: A | Discharge status: Home / Self Care | Payer: BC Managed Care – PPO | Attending: Physician Assistant | Admitting: Physician Assistant

## 2019-07-10 ENCOUNTER — Other Ambulatory Visit: Payer: Self-pay

## 2019-07-10 DIAGNOSIS — M25511 Pain in right shoulder: Secondary | ICD-10-CM

## 2019-07-10 DIAGNOSIS — I10 Essential (primary) hypertension: Secondary | ICD-10-CM

## 2019-07-10 MED ORDER — MELOXICAM 7.5 MG PO TABS: 7.5000 mg | ORAL_TABLET | Freq: Every day | ORAL | 0 refills | Status: DC

## 2019-07-10 MED ORDER — METHOCARBAMOL 500 MG PO TABS: 500.0000 mg | ORAL_TABLET | Freq: Two times a day (BID) | ORAL | 0 refills | Status: DC

## 2019-07-10 NOTE — ED Provider Notes (Signed)
[facility]    CSN: [id number] Arrival date & time: 07/10/19  1114      History   Chief Complaint Chief Complaint  Patient presents with  . Shoulder Pain    HPI Vickie Galvan is a 44 y.o. female.   44 year old female comes in for 49-month history of right shoulder pain.  Denies injury/trauma.  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  Symptoms started after starting working at post office, she has since stopped working there, and thought symptoms would improve on own.  However, continues with symptoms, and feels that it is worsening.  She denies any pain at rest.  Pain is present during range of motion.  Denies radiation of pain, neck pain, loss of grip strength.  Has been trying ice compress and heat compress without relief.     Past Medical History:  Diagnosis Date  . Anxiety   . Cough 09/17/2015  . Goiter   . Hypertension    states under control with med., has been on med. x 9 yr.  . Muscle diastasis 08/2015   abd.  . Stuffy and runny nose 09/17/2015   clear drainage from nose, per pt.    Patient Active Problem List   Diagnosis Date Noted  . Rhinitis, chronic 06/26/2016  . Obstructive sleep apnea 01/02/2016  . Hyperglycemia 01/02/2016  . Ventral hernia 05/04/2014  . Epigastric hernia 01/30/2014  . Umbilical hernia [id number]  . Preventative health care 05/04/2011  . Rash 05/04/2011  . Environmental allergies 10/27/2010  . Anxiety 10/27/2010  . Cervicalgia 08/20/2010  . GOITER, MULTINODULAR 05/13/2009  . MENORRHAGIA 03/01/2009  . FATIGUE 03/01/2009  . ANEMIA, IRON DEFICIENCY 08/03/2008    Past Surgical History:  Procedure Laterality Date  . ABDOMINOPLASTY Bilateral 09/23/2015   Procedure: EXPLORATION OF ABDOMEN REPAIR OF SCAR AND DIASTASIS ;  Surgeon: Cristine Polio, MD;  Location: [place];  Service: Plastics;  Laterality: Bilateral;  . CESAREAN SECTION   01/06/2005; 07/02/2006   X 2  . COLONOSCOPY    . INSERTION OF MESH N/A 05/04/2014   Procedure: INSERTION OF MESH;  Surgeon: Fanny Skates, MD;  Location: [place];  Service: General;  Laterality: N/A;  . INSERTION OF MESH N/A 05/06/2015   Procedure: INSERTION OF MESH;  Surgeon: Cristine Polio, MD;  Location: [place];  Service: Plastics;  Laterality: N/A;  umbiical  . RIGHT OOPHORECTOMY  01/06/2005  . TUBAL LIGATION  07/02/2006  . VENTRAL HERNIA REPAIR N/A 05/04/2014   Procedure: LAPAROSCOPIC REPAIR MUTIPLE INCARCERATED VENTRAL HERNIAS;  Surgeon: Fanny Skates, MD;  Location: [place];  Service: General;  Laterality: N/A;  . VENTRAL HERNIA REPAIR N/A 05/06/2015   Procedure: EXPLORATION OF ABDOMIN, REPAIR RECURRENT VENTRAL HERNIA AND SEVERE DIASTASIS RECTI;  Surgeon: Cristine Polio, MD;  Location: [place];  Service: Plastics;  Laterality: N/A;    OB History   No obstetric history on file.      Home Medications    Prior to Admission medications   Medication Sig Start Date End Date Taking? Authorizing Provider  ALPRAZolam Duanne Moron) 0.25 MG tablet TAKE 1 TABLET BY MOUTH TWICE A DAY AS NEEDED ANXIETY 02/14/18   Biagio Borg, MD  meloxicam (MOBIC) 7.5 MG tablet Take 1 tablet (7.5 mg total) by mouth daily. 07/10/19   Tasia Catchings, Kelin Borum V, PA-C  methocarbamol (ROBAXIN) 500 MG tablet Take 1 tablet (500 mg total)  by mouth 2 (two) times daily. 07/10/19   Tasia Catchings, Jamesia Linnen V, PA-C  potassium chloride (K-DUR) 10 MEQ tablet Take 1 tablet (10 mEq total) by mouth daily. 02/23/17   Biagio Borg, MD  potassium chloride (K-DUR) 10 MEQ tablet TAKE 1 TABLET BY MOUTH EVERY DAY 04/25/18   Biagio Borg, MD  telmisartan-hydrochlorothiazide (MICARDIS HCT) 80-12.5 MG tablet TAKE 1 TABLET BY MOUTH EVERY DAY 08/03/18   Biagio Borg, MD    Family History Family History  Problem Relation Age of Onset  . Hypertension Maternal Grandmother   . Hypertension Maternal Grandfather   . Diabetes Maternal  Grandfather   . Stroke Maternal Grandfather   . Hypertension Mother     Social History Social History   Tobacco Use  . Smoking status: Never Smoker  . Smokeless tobacco: Never Used  Substance Use Topics  . Alcohol use: Yes    Comment: occasionally  . Drug use: No     Allergies   Azithromycin and Cephalexin   Review of Systems Review of Systems  Reason unable to perform ROS: See HPI as above.     Physical Exam Triage Vital Signs ED Triage Vitals  Enc Vitals Group     BP 07/10/19 1119 (!) 180/130     Pulse Rate 07/10/19 1119 97     Resp 07/10/19 1119 16     Temp 07/10/19 1119 97.8 F (36.6 C)     Temp Source 07/10/19 1119 Temporal     SpO2 07/10/19 1119 97 %     Weight --      Height --      Head Circumference --      Peak Flow --      Pain Score 07/10/19 1121 7     Pain Loc --      Pain Edu? --      Excl. in [place]? --    No data found.  Updated Vital Signs BP (!) 180/130 (BP Location: Left Arm)   Pulse 97   Temp 97.8 F (36.6 C) (Temporal)   Resp 16   LMP 06/30/2019   SpO2 97%   Visual Acuity Right Eye Distance:   Left Eye Distance:   Bilateral Distance:    Right Eye Near:   Left Eye Near:    Bilateral Near:     Physical Exam Constitutional:      General: She is not in acute distress.    Appearance: Normal appearance. She is not ill-appearing, toxic-appearing or diaphoretic.  HENT:     Head: Normocephalic and atraumatic.     Mouth/Throat:     Mouth: Mucous membranes are moist.     Pharynx: Oropharynx is clear. Uvula midline.  Cardiovascular:     Rate and Rhythm: Normal rate and regular rhythm.     Heart sounds: Normal heart sounds. No murmur. No friction rub. No gallop.   Pulmonary:     Effort: Pulmonary effort is normal. No accessory muscle usage, prolonged expiration, respiratory distress or retractions.     Comments: Lungs clear to auscultation without adventitious lung sounds. Musculoskeletal:     Cervical back: Normal range of  motion and neck supple. No pain with movement, spinous process tenderness or muscular tenderness.     Comments: No swelling, erythema, warmth, contusion.  No tenderness to palpation of the neck, thoracic back.  No tenderness to palpation of posterior shoulder.  No tenderness to palpation of upper arm.  Full range of motion of neck, shoulder, back, elbow.  Strength normal and equal bilaterally.  Normal grip strength.  Sensation intact and equal bilaterally.  Radial pulse 2+, cap refill less than 2 seconds.  Neurological:     General: No focal deficit present.     Mental Status: She is alert and oriented to person, place, and time.      UC Treatments / Results  Labs (all labs ordered are listed, but only abnormal results are displayed) Labs Reviewed - No data to display  EKG   Radiology No results found.  Procedures Procedures (including critical care time)  Medications Ordered in UC Medications - No data to display  Initial Impression / Assessment and Plan / UC Course  I have reviewed the triage vital signs and the nursing notes.  Pertinent labs & imaging results that were available during my care of the patient were reviewed by me and considered in my medical decision making (see chart for details).    Will provide NSAIDs and muscle relaxer for symptomatic management at this time.  Continue compresses as needed.  Follow-up with sports medicine/orthopedics for further evaluation if symptoms not improving.  Return precautions given.  Patient expresses understanding and agrees to plan.  Final Clinical Impressions(s) / UC Diagnoses   Final diagnoses:  Acute pain of right shoulder   ED Prescriptions    Medication Sig Dispense Auth. Provider   meloxicam (MOBIC) 7.5 MG tablet Take 1 tablet (7.5 mg total) by mouth daily. 15 tablet Taraneh Metheney V, PA-C   methocarbamol (ROBAXIN) 500 MG tablet Take 1 tablet (500 mg total) by mouth 2 (two) times daily. 20 tablet Ok Edwards, PA-C     PDMP  not reviewed this encounter.   Ok Edwards, PA-C 07/10/19 1147

## 2019-07-10 NOTE — ED Triage Notes (Signed)
Pt presents to [facility] for assessment of right shoulder pain x 2 months.  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  States she thought it was going to get better, but now it seems to be worsening.    Pt states with her BP being high today that she is in a lot of pain and under a lot of stress at home.

## 2019-07-10 NOTE — Discharge Instructions (Signed)
Start Mobic. Do not take ibuprofen (motrin/advil)/ naproxen (aleve) while on mobic. Robaxin as needed, this can make you drowsy, so do not take if you are going to drive, operate heavy machinery, or make important decisions. Ice/heat compresses as needed. Follow up with sports medicine/orthopedics if symptoms not improving.

## 2019-08-15 DIAGNOSIS — L910 Hypertrophic scar: Secondary | ICD-10-CM | POA: Diagnosis not present

## 2019-08-15 DIAGNOSIS — L080 Pyoderma: Secondary | ICD-10-CM | POA: Diagnosis not present

## 2019-08-24 ENCOUNTER — Ambulatory Visit
Admission: RE | Admit: 2019-08-24 | Discharge: 2019-08-24 | Disposition: A | Discharge status: Home / Self Care | Payer: BC Managed Care – PPO | Source: Ambulatory Visit | Attending: Obstetrics & Gynecology | Admitting: Obstetrics & Gynecology

## 2019-08-24 ENCOUNTER — Other Ambulatory Visit: Payer: Self-pay | Admitting: Obstetrics & Gynecology

## 2019-08-24 ENCOUNTER — Other Ambulatory Visit: Payer: Self-pay

## 2019-08-24 DIAGNOSIS — N6489 Other specified disorders of breast: Secondary | ICD-10-CM | POA: Diagnosis not present

## 2019-08-24 DIAGNOSIS — R921 Mammographic calcification found on diagnostic imaging of breast: Secondary | ICD-10-CM

## 2019-08-24 DIAGNOSIS — R928 Other abnormal and inconclusive findings on diagnostic imaging of breast: Secondary | ICD-10-CM

## 2019-09-12 DIAGNOSIS — D2371 Other benign neoplasm of skin of right lower limb, including hip: Secondary | ICD-10-CM | POA: Diagnosis not present

## 2019-09-12 DIAGNOSIS — L910 Hypertrophic scar: Secondary | ICD-10-CM | POA: Diagnosis not present

## 2019-09-12 DIAGNOSIS — L080 Pyoderma: Secondary | ICD-10-CM | POA: Diagnosis not present

## 2019-09-29 ENCOUNTER — Other Ambulatory Visit: Payer: Self-pay

## 2019-09-29 ENCOUNTER — Emergency Department (HOSPITAL_COMMUNITY)
Admission: EM | Admit: 2019-09-29 | Discharge: 2019-09-29 | Disposition: A | Discharge status: Home / Self Care | Payer: BC Managed Care – PPO | Attending: Emergency Medicine | Admitting: Emergency Medicine

## 2019-09-29 ENCOUNTER — Encounter (HOSPITAL_COMMUNITY): Payer: Self-pay

## 2019-09-29 ENCOUNTER — Emergency Department (HOSPITAL_COMMUNITY): Payer: BC Managed Care – PPO

## 2019-09-29 DIAGNOSIS — R7989 Other specified abnormal findings of blood chemistry: Secondary | ICD-10-CM | POA: Insufficient documentation

## 2019-09-29 DIAGNOSIS — R002 Palpitations: Secondary | ICD-10-CM | POA: Diagnosis not present

## 2019-09-29 DIAGNOSIS — R450 Nervousness: Secondary | ICD-10-CM | POA: Insufficient documentation

## 2019-09-29 DIAGNOSIS — E876 Hypokalemia: Secondary | ICD-10-CM | POA: Diagnosis not present

## 2019-09-29 DIAGNOSIS — I10 Essential (primary) hypertension: Secondary | ICD-10-CM | POA: Diagnosis not present

## 2019-09-29 DIAGNOSIS — R42 Dizziness and giddiness: Secondary | ICD-10-CM | POA: Diagnosis not present

## 2019-09-29 DIAGNOSIS — R000 Tachycardia, unspecified: Secondary | ICD-10-CM | POA: Diagnosis not present

## 2019-09-29 DIAGNOSIS — Z79899 Other long term (current) drug therapy: Secondary | ICD-10-CM | POA: Diagnosis not present

## 2019-09-29 DIAGNOSIS — D649 Anemia, unspecified: Secondary | ICD-10-CM | POA: Diagnosis not present

## 2019-09-29 DIAGNOSIS — R946 Abnormal results of thyroid function studies: Secondary | ICD-10-CM | POA: Diagnosis not present

## 2019-09-29 LAB — URINALYSIS, ROUTINE W REFLEX MICROSCOPIC
Bilirubin Urine: NEGATIVE
Glucose, UA: NEGATIVE mg/dL
Ketones, ur: 20 mg/dL — AB
Leukocytes,Ua: NEGATIVE
Nitrite: NEGATIVE
Protein, ur: NEGATIVE mg/dL
Specific Gravity, Urine: 1.008 (ref 1.005–1.030)
pH: 6 (ref 5.0–8.0)

## 2019-09-29 LAB — BASIC METABOLIC PANEL
Anion gap: 19 — ABNORMAL HIGH (ref 5–15)
BUN: 12 mg/dL (ref 6–20)
CO2: 22 mmol/L (ref 22–32)
Calcium: 8.6 mg/dL — ABNORMAL LOW (ref 8.9–10.3)
Chloride: 96 mmol/L — ABNORMAL LOW (ref 98–111)
Creatinine, Ser: 0.85 mg/dL (ref 0.44–1.00)
GFR calc Af Amer: >60 mL/min (ref >60)
GFR calc non Af Amer: >60 mL/min (ref >60)
Glucose, Bld: 85 mg/dL (ref 70–99)
Potassium: 2.7 mmol/L — CL (ref 3.5–5.1)
Sodium: 137 mmol/L (ref 135–145)

## 2019-09-29 LAB — TSH: TSH: 8.646 u[IU]/mL — ABNORMAL HIGH (ref 0.350–4.500)

## 2019-09-29 LAB — CBC
HCT: 27.7 % — ABNORMAL LOW (ref 36.0–46.0)
Hemoglobin: 7.9 g/dL — ABNORMAL LOW (ref 12.0–15.0)
MCH: 24.3 pg — ABNORMAL LOW (ref 26.0–34.0)
MCHC: 28.5 g/dL — ABNORMAL LOW (ref 30.0–36.0)
MCV: 85.2 fL (ref 80.0–100.0)
Platelets: 331 10*3/uL (ref 150–400)
RBC: 3.25 MIL/uL — ABNORMAL LOW (ref 3.87–5.11)
RDW: 21.1 % — ABNORMAL HIGH (ref 11.5–15.5)
WBC: 5.4 10*3/uL (ref 4.0–10.5)
nRBC: 0 % (ref 0.0–0.2)

## 2019-09-29 LAB — I-STAT BETA HCG BLOOD, ED (MC, WL, AP ONLY): I-stat hCG, quantitative: <5 m[IU]/mL (ref <5)

## 2019-09-29 LAB — D-DIMER, QUANTITATIVE: D-Dimer, Quant: 0.96 ug/mL-FEU — ABNORMAL HIGH (ref 0.00–0.50)

## 2019-09-29 LAB — TROPONIN I (HIGH SENSITIVITY)
Troponin I (High Sensitivity): 14 ng/L (ref <18)
Troponin I (High Sensitivity): 18 ng/L — ABNORMAL HIGH (ref <18)

## 2019-09-29 LAB — POC OCCULT BLOOD, ED: Fecal Occult Bld: POSITIVE — AB

## 2019-09-29 MED ORDER — HYDRALAZINE HCL 20 MG/ML IJ SOLN
10.0000 mg | Freq: Once | INTRAMUSCULAR | Status: AC
  Administered 2019-09-29: 10 mg via INTRAVENOUS
  Filled 2019-09-29: qty 1

## 2019-09-29 MED ORDER — ALPRAZOLAM 0.25 MG PO TABS
0.2500 mg | ORAL_TABLET | Freq: Once | ORAL | Status: AC
  Administered 2019-09-29: 19:00:00 0.25 mg via ORAL
  Filled 2019-09-29: qty 1

## 2019-09-29 MED ORDER — POTASSIUM CHLORIDE 10 MEQ/100ML IV SOLN
10.0000 meq | Freq: Once | INTRAVENOUS | Status: AC
  Administered 2019-09-29: 17:00:00 10 meq via INTRAVENOUS
  Filled 2019-09-29: qty 100

## 2019-09-29 MED ORDER — LABETALOL HCL 5 MG/ML IV SOLN
20.0000 mg | Freq: Once | INTRAVENOUS | Status: AC
  Administered 2019-09-29: 21:00:00 20 mg via INTRAVENOUS
  Filled 2019-09-29: qty 4

## 2019-09-29 MED ORDER — SODIUM CHLORIDE 0.9 % IV BOLUS
1000.0000 mL | Freq: Once | INTRAVENOUS | Status: AC
  Administered 2019-09-29: 18:00:00 1000 mL via INTRAVENOUS

## 2019-09-29 MED ORDER — IOHEXOL 350 MG/ML SOLN
100.0000 mL | Freq: Once | INTRAVENOUS | Status: AC | PRN
  Administered 2019-09-29: 21:00:00 100 mL via INTRAVENOUS

## 2019-09-29 NOTE — ED Notes (Signed)
Discharge instructions reviewed with pt. Pt verbalized understanding.   

## 2019-09-29 NOTE — Discharge Instructions (Signed)
Please schedule an appointment with your PCP for reeval this Monday or Tuesday Start taking your potassium supplements as prescribed. You will need your potassium level rechecked by your PCP next week He will also need to recheck your hemoglobin level. It may be decreased due to your heavy menstrual cycle  Discuss your thyroid levels with your PCP as well. Your Thyroid Stimulating Hormone (TSH) was elevated today and you may need to be started on medication for it Take your blood pressure medication as indicated. It is very important to take this every single day as noncompliance and elevated blood pressures can lead to kidney, heart, and eye damage in the future  Return to the ED for any worsening symptoms including new chest pain, shortness of breath, dizziness/lightheadedness, passing out, or any other concerning symptoms

## 2019-09-29 NOTE — ED Notes (Signed)
Pt transferred to CT.

## 2019-09-29 NOTE — ED Notes (Signed)
Pt ambulated to restroom with assistance. Noted to have a steady gait.

## 2019-09-29 NOTE — ED Triage Notes (Signed)
Pt reports increased anxiety lately with dizziness that started this morning. Pt also c.o pain in her ears. Pt a.o, nad noted

## 2019-09-29 NOTE — ED Provider Notes (Signed)
[facility] EMERGENCY DEPARTMENT Provider Note   CSN: [id number] Arrival date & time: 09/29/19  1225     History Chief Complaint  Patient presents with  . Anxiety  . Dizziness    Vickie Galvan is a 44 y.o. female who presents to the ED today with complaint of sudden onset lightheadedness and palpitations that began earlier this morning. Pt endorses hx of anxiety and states this felt similar. She has Xanax to take PRN. Pt reports she took 0.25 mg Xanax today which did not help her symptoms, prompting her to come to the ED. Pt reports she is feeling improved now however still reports "nervousness." No SI, HI, or AVH. She does mention she started her menstrual cycle yesterday; for the past few years they have been quite heavy. Pt is unsure how many pads she goes through a day but reports she changed her large maxipad out every 2 hours or so. She has been seen by OBGYN in the past and reports hx of anemia; she was on iron supplementation however is not on it anymore. Pt also mentions she stopped taking her potassium supplements 2-3 weeks ago; she reports she is on this due to her HCTZ for high blood pressure. She denies fevers, chills, chest pain, shortness of breath, abdominal pain, vomiting, coffee ground emesis, blood in stool, dark tarry stools, or any other associated symptoms.   The history is provided by the patient and medical records.       Past Medical History:  Diagnosis Date  . Anxiety   . Cough 09/17/2015  . Goiter   . Hypertension    states under control with med., has been on med. x 9 yr.  . Muscle diastasis 08/2015   abd.  . Stuffy and runny nose 09/17/2015   clear drainage from nose, per pt.    Patient Active Problem List   Diagnosis Date Noted  . Rhinitis, chronic 06/26/2016  . Obstructive sleep apnea 01/02/2016  . Hyperglycemia 01/02/2016  . Ventral hernia 05/04/2014  . Epigastric hernia 01/30/2014  . Umbilical hernia [id number]  .  Preventative health care 05/04/2011  . Rash 05/04/2011  . Environmental allergies 10/27/2010  . Anxiety 10/27/2010  . Cervicalgia 08/20/2010  . GOITER, MULTINODULAR 05/13/2009  . MENORRHAGIA 03/01/2009  . FATIGUE 03/01/2009  . ANEMIA, IRON DEFICIENCY 08/03/2008    Past Surgical History:  Procedure Laterality Date  . ABDOMINOPLASTY Bilateral 09/23/2015   Procedure: EXPLORATION OF ABDOMEN REPAIR OF SCAR AND DIASTASIS ;  Surgeon: Cristine Polio, MD;  Location: [facility];  Service: Plastics;  Laterality: Bilateral;  . BREAST BIOPSY Left 08/2018  . BREAST BIOPSY Left 04/2016  . CESAREAN SECTION  01/06/2005; 07/02/2006   X 2  . COLONOSCOPY    . INSERTION OF MESH N/A 05/04/2014   Procedure: INSERTION OF MESH;  Surgeon: Fanny Skates, MD;  Location: [place];  Service: General;  Laterality: N/A;  . INSERTION OF MESH N/A 05/06/2015   Procedure: INSERTION OF MESH;  Surgeon: Cristine Polio, MD;  Location: [place];  Service: Plastics;  Laterality: N/A;  umbiical  . RIGHT OOPHORECTOMY  01/06/2005  . TUBAL LIGATION  07/02/2006  . VENTRAL HERNIA REPAIR N/A 05/04/2014   Procedure: LAPAROSCOPIC REPAIR MUTIPLE INCARCERATED VENTRAL HERNIAS;  Surgeon: Fanny Skates, MD;  Location: [place];  Service: General;  Laterality: N/A;  . VENTRAL HERNIA REPAIR N/A 05/06/2015   Procedure: EXPLORATION OF ABDOMIN, REPAIR RECURRENT VENTRAL HERNIA AND SEVERE DIASTASIS RECTI;  Surgeon: Berneta Sages  [name], MD;  Location: [facility];  Service: Plastics;  Laterality: N/A;     OB History   No obstetric history on file.     Family History  Problem Relation Age of Onset  . Hypertension Maternal Grandmother   . Hypertension Maternal Grandfather   . Diabetes Maternal Grandfather   . Stroke Maternal Grandfather   . Hypertension Mother     Social History   Tobacco Use  . Smoking status: Never Smoker  . Smokeless tobacco: Never Used  Substance Use Topics  . Alcohol use:  Yes    Comment: occasionally  . Drug use: No    Home Medications Prior to Admission medications   Medication Sig Start Date End Date Taking? Authorizing Provider  ALPRAZolam Duanne Moron) 0.25 MG tablet TAKE 1 TABLET BY MOUTH TWICE A DAY AS NEEDED ANXIETY 02/14/18   Biagio Borg, MD  meloxicam (MOBIC) 7.5 MG tablet Take 1 tablet (7.5 mg total) by mouth daily. 07/10/19   Tasia Catchings, Amy V, PA-C  methocarbamol (ROBAXIN) 500 MG tablet Take 1 tablet (500 mg total) by mouth 2 (two) times daily. 07/10/19   Tasia Catchings, Amy V, PA-C  potassium chloride (K-DUR) 10 MEQ tablet Take 1 tablet (10 mEq total) by mouth daily. 02/23/17   Biagio Borg, MD  potassium chloride (K-DUR) 10 MEQ tablet TAKE 1 TABLET BY MOUTH EVERY DAY 04/25/18   Biagio Borg, MD  telmisartan-hydrochlorothiazide (MICARDIS HCT) 80-12.5 MG tablet TAKE 1 TABLET BY MOUTH EVERY DAY 08/03/18   Biagio Borg, MD    Allergies    Azithromycin and Cephalexin  Review of Systems   Review of Systems  Constitutional: Negative for chills and fever.  Respiratory: Negative for shortness of breath.   Cardiovascular: Positive for palpitations. Negative for chest pain and leg swelling.  Gastrointestinal: Negative for abdominal pain, diarrhea, nausea and vomiting.  Genitourinary: Positive for menstrual problem.  Neurological: Positive for light-headedness.  Psychiatric/Behavioral: Negative for hallucinations and suicidal ideas. The patient is nervous/anxious.   All other systems reviewed and are negative.   Physical Exam Updated Vital Signs BP (!) 169/101 (BP Location: Right Arm)   Pulse (!) 105   Temp 98.4 F (36.9 C) (Oral)   Resp 15   SpO2 100%   Physical Exam Vitals and nursing note reviewed.  Constitutional:      Appearance: She is not ill-appearing or diaphoretic.  HENT:     Head: Normocephalic and atraumatic.  Eyes:     Extraocular Movements: Extraocular movements intact.     Conjunctiva/sclera: Conjunctivae normal.     Pupils: Pupils are equal,  round, and reactive to light.  Cardiovascular:     Rate and Rhythm: Normal rate and regular rhythm.     Pulses: Normal pulses.  Pulmonary:     Effort: Pulmonary effort is normal.     Breath sounds: Normal breath sounds. No wheezing, rhonchi or rales.  Abdominal:     Palpations: Abdomen is soft.     Tenderness: There is no abdominal tenderness. There is no right CVA tenderness, left CVA tenderness, guarding or rebound.  Musculoskeletal:     Cervical back: Neck supple.  Skin:    General: Skin is warm and dry.  Neurological:     Mental Status: She is alert.     Comments: CN 3-12 grossly intact A&O x4 GCS 15 Sensation and strength intact Gait nonataxic including with tandem walking Coordination with finger-to-nose WNL Neg romberg, neg pronator drift     ED  Results / Procedures / Treatments   Labs (all labs ordered are listed, but only abnormal results are displayed) Labs Reviewed  BASIC METABOLIC PANEL - Abnormal; Notable for the following components:      Result Value   Potassium 2.7 (*)    Chloride 96 (*)    Calcium 8.6 (*)    Anion gap 19 (*)    All other components within normal limits  CBC - Abnormal; Notable for the following components:   RBC 3.25 (*)    Hemoglobin 7.9 (*)    HCT 27.7 (*)    MCH 24.3 (*)    MCHC 28.5 (*)    RDW 21.1 (*)    All other components within normal limits  URINALYSIS, ROUTINE W REFLEX MICROSCOPIC - Abnormal; Notable for the following components:   Hgb urine dipstick LARGE (*)    Ketones, ur 20 (*)    Bacteria, UA RARE (*)    All other components within normal limits  D-DIMER, QUANTITATIVE (NOT AT [facility]) - Abnormal; Notable for the following components:   D-Dimer, Quant 0.96 (*)    All other components within normal limits  TSH - Abnormal; Notable for the following components:   TSH 8.646 (*)    All other components within normal limits  POC OCCULT BLOOD, ED - Abnormal; Notable for the following components:   Fecal Occult Bld  POSITIVE (*)    All other components within normal limits  TROPONIN I (HIGH SENSITIVITY) - Abnormal; Notable for the following components:   Troponin I (High Sensitivity) 18 (*)    All other components within normal limits  I-STAT BETA HCG BLOOD, ED ([facility], [facility], [facility] ONLY)  TROPONIN I (HIGH SENSITIVITY)    EKG EKG Interpretation  Date/Time:  Friday September 29 2019 13:03:45 EDT Ventricular Rate:  106 PR Interval:  158 QRS Duration: 64 QT Interval:  358 QTC Calculation: 475 R Axis:   93 Text Interpretation: Sinus tachycardia Rightward axis Septal infarct , age undetermined ST & T wave abnormality, consider inferolateral ischemia Abnormal ECG agree, inferior lateral changes new. Confirmed by Charlesetta Shanks [phone]) on 09/29/2019 6:11:55 PM   Radiology CT Angio Chest PE W/Cm &/Or Wo Cm  Result Date: 09/29/2019 CLINICAL DATA:  Dizziness, anxiety elevated D-dimer, evaluate for PE EXAM: CT ANGIOGRAPHY CHEST WITH CONTRAST TECHNIQUE: Multidetector CT imaging of the chest was performed using the standard protocol during bolus administration of intravenous contrast. Multiplanar CT image reconstructions and MIPs were obtained to evaluate the vascular anatomy. CONTRAST:  162mL OMNIPAQUE IOHEXOL 350 MG/ML SOLN COMPARISON:  None. FINDINGS: Cardiovascular: Satisfactory opacification of the bilateral pulmonary arteries to the lobar level. No evidence of pulmonary embolism. Mild artifact in branches of the subsegmental left lower lobe pulmonary artery. Although not tailored for evaluation of the thoracic aorta, there is no evidence thoracic aortic aneurysm or dissection. The heart is normal in size.  No pericardial effusion. Mediastinum/Nodes: No suspicious mediastinal lymphadenopathy. Enlarged thyroid with multinodular goiter including substernal extension on the left (series 5/image 29). This has been documented on prior studies (ref: J Am Coll Radiol. 2015 Feb;12(2): 143-50). Lungs/Pleura: Lungs are clear. No  suspicious pulmonary nodules. No focal consolidation. No pleural effusion or pneumothorax. Upper Abdomen: Visualized upper abdomen is grossly unremarkable. Musculoskeletal: Visualized osseous structures are within normal limits. Review of the MIP images confirms the above findings. IMPRESSION: No evidence of pulmonary embolism. Negative CT chest. Electronically Signed   By: Julian Hy M.D.   On: 09/29/2019 21:06    Procedures  Procedures (including critical care time)  Medications Ordered in ED Medications  potassium chloride 10 mEq in 100 mL IVPB (0 mEq Intravenous Stopped 09/29/19 1850)  hydrALAZINE (APRESOLINE) injection 10 mg (10 mg Intravenous Given 09/29/19 1742)  sodium chloride 0.9 % bolus 1,000 mL (0 mLs Intravenous Stopped 09/29/19 2029)  ALPRAZolam (XANAX) tablet 0.25 mg (0.25 mg Oral Given 09/29/19 1914)  labetalol (NORMODYNE) injection 20 mg (20 mg Intravenous Given 09/29/19 2116)  iohexol (OMNIPAQUE) 350 MG/ML injection 100 mL (100 mLs Intravenous Contrast Given 09/29/19 2043)    ED Course  I have reviewed the triage vital signs and the nursing notes.  Pertinent labs & imaging results that were available during my care of the patient were reviewed by me and considered in my medical decision making (see chart for details).  Clinical Course as of Sep 29 2310  Fri Sep 29, 2019  1607 Potassium(!!): 2.7 [MV]  1607 Hemoglobin(!): 7.9 [MV]  1701 Does endorse hx of internal hemorrhoid  Fecal Occult Blood, POC(!): POSITIVE [MV]  1906 D-Dimer, Quant(!): 0.96 [MV]  2048 TSH(!): 8.646 [MV]    Clinical Course User Index [MV] Eustaquio Maize, PA-C   MDM Rules/Calculators/A&P                      44 year old female who presents to the ED initially complaining of anxiety.  Reports history of same and states that she began feeling anxious today and was unable to control it with her Xanax at home.  On arrival to the ED patient is tachycardic in the 110s.  Her blood pressure is also noted to  be elevated in the [id number] systolic.  Patient initially reports that she is compliant with her blood pressure medication however during ED visit she notes that she has been off of it for about 4 days.  She is afebrile and nontachypneic.  She appears to be in no acute distress.  She denies any chest pain or shortness of breath.  She has no risk factors for PE at this time however her EKG does show some T wave abnormalities.  Troponin and D-dimer have been added.  Will work-up with screening labs and reevaluate.  Denies SI, HI, AVH.  CBC without leukocytosis. Hgb noted to be 7.9; pt endorses hx of anemia however not currently on iron supplementation. She is currently on her menstrual cycle and she notes heavy menses with hx of fibroids. Rectal exam performed; no obvious melanotic stool however returned guaiac positive today; pt does endorse hx of internal hemorrhoids. No coffee ground emesis or abdominal pain today; suspect hgb level is likely related to menstrual cycle  BMP with potassium 2.7; will replete in the ED. Pt is supposed to be on potassium supplements given she takes HCTZ for HTN however she has not been taking this for 2-3 weeks. There is concern for compliance issues at this time given elevated BP and decreased potassium. No EKG abnormalities.  Initial troponin of 14.  D dimer elevated at 0.96; will perform CTA at this time as PE may be contributing to pt's tachycardia TSH elevated at 8.646; pt reports hx of goiter however has never been on medication. Will have her discuss this with her PCP.   CTA has returned negative.  Blood pressure has normalized after hyralazine and labetalol. Tachycardia has also improved. Suspect tachycardia present s/2 elevated TSH as well as uncontrolled blood pressure. Lengthy discussion with pt at bedside in regards to compliance of medication. She understands the risks of  chronic uncontrolled BP. She will start taking her medications today and keep a log to take to  her PCP. She is advised to have her hgb and potassium level rechecked by PCP next week and to discuss TSH elevation. Strict return precautions discussed with pt - she is in agreement with plan and stable for discharge home.   This note was prepared using Dragon voice recognition software and may include unintentional dictation errors due to the inherent limitations of voice recognition software.  Final Clinical Impression(s) / ED Diagnoses Final diagnoses:  Essential hypertension  Anemia, unspecified type  Hypokalemia  TSH elevation    Rx / DC Orders ED Discharge Orders    None       Discharge Instructions     Please schedule an appointment with your PCP for reeval this Monday or Tuesday Start taking your potassium supplements as prescribed. You will need your potassium level rechecked by your PCP next week He will also need to recheck your hemoglobin level. It may be decreased due to your heavy menstrual cycle  Discuss your thyroid levels with your PCP as well. Your Thyroid Stimulating Hormone (TSH) was elevated today and you may need to be started on medication for it Take your blood pressure medication as indicated. It is very important to take this every single day as noncompliance and elevated blood pressures can lead to kidney, heart, and eye damage in the future  Return to the ED for any worsening symptoms including new chest pain, shortness of breath, dizziness/lightheadedness, passing out, or any other concerning symptoms       Eustaquio Maize, PA-C 09/29/19 2312    Charlesetta Shanks, MD 10/09/19 [phone]

## 2019-10-03 ENCOUNTER — Ambulatory Visit: Payer: BC Managed Care – PPO | Admitting: Family

## 2019-10-03 ENCOUNTER — Other Ambulatory Visit: Payer: Self-pay

## 2019-10-03 ENCOUNTER — Encounter: Payer: Self-pay | Admitting: Family

## 2019-10-03 VITALS — BP 144/88 | HR 104 | Temp 98.1°F | Ht 66.0 in | Wt 148.8 lb

## 2019-10-03 DIAGNOSIS — D509 Iron deficiency anemia, unspecified: Secondary | ICD-10-CM

## 2019-10-03 DIAGNOSIS — I10 Essential (primary) hypertension: Secondary | ICD-10-CM | POA: Diagnosis not present

## 2019-10-03 DIAGNOSIS — E039 Hypothyroidism, unspecified: Secondary | ICD-10-CM | POA: Diagnosis not present

## 2019-10-03 DIAGNOSIS — E876 Hypokalemia: Secondary | ICD-10-CM | POA: Diagnosis not present

## 2019-10-03 LAB — CBC WITH DIFFERENTIAL/PLATELET
Basophils Absolute: 0.1 10*3/uL (ref 0.0–0.1)
Basophils Relative: 1.1 % (ref 0.0–3.0)
Eosinophils Absolute: 0.1 10*3/uL (ref 0.0–0.7)
Eosinophils Relative: 1.8 % (ref 0.0–5.0)
HCT: 25.5 % — ABNORMAL LOW (ref 36.0–46.0)
Hemoglobin: 7.9 g/dL — CL (ref 12.0–15.0)
Lymphocytes Relative: 30.8 % (ref 12.0–46.0)
Lymphs Abs: 1.5 10*3/uL (ref 0.7–4.0)
MCHC: 30.9 g/dL (ref 30.0–36.0)
MCV: 81.7 fl (ref 78.0–100.0)
Monocytes Absolute: 0.5 10*3/uL (ref 0.1–1.0)
Monocytes Relative: 10.2 % (ref 3.0–12.0)
Neutro Abs: 2.7 10*3/uL (ref 1.4–7.7)
Neutrophils Relative %: 56.1 % (ref 43.0–77.0)
Platelets: 403 10*3/uL — ABNORMAL HIGH (ref 150.0–400.0)
RBC: 3.12 Mil/uL — ABNORMAL LOW (ref 3.87–5.11)
RDW: 22.4 % — ABNORMAL HIGH (ref 11.5–15.5)
WBC: 4.9 10*3/uL (ref 4.0–10.5)

## 2019-10-03 LAB — COMPREHENSIVE METABOLIC PANEL
ALT: 37 U/L — ABNORMAL HIGH (ref 0–35)
AST: 105 U/L — ABNORMAL HIGH (ref 0–37)
Albumin: 4.1 g/dL (ref 3.5–5.2)
Alkaline Phosphatase: 95 U/L (ref 39–117)
BUN: 12 mg/dL (ref 6–23)
CO2: 32 mEq/L (ref 19–32)
Calcium: 9.2 mg/dL (ref 8.4–10.5)
Chloride: 96 mEq/L (ref 96–112)
Creatinine, Ser: 0.9 mg/dL (ref 0.40–1.20)
GFR: 82.3 mL/min (ref >60.00)
Glucose, Bld: 112 mg/dL — ABNORMAL HIGH (ref 70–99)
Potassium: 2.8 mEq/L — CL (ref 3.5–5.1)
Sodium: 137 mEq/L (ref 135–145)
Total Bilirubin: 0.6 mg/dL (ref 0.2–1.2)
Total Protein: 8.5 g/dL — ABNORMAL HIGH (ref 6.0–8.3)

## 2019-10-03 LAB — IRON,TIBC AND FERRITIN PANEL
%SAT: 2 % (calc) — ABNORMAL LOW (ref 16–45)
Ferritin: 10 ng/mL — ABNORMAL LOW (ref 16–232)
Iron: 11 ug/dL — ABNORMAL LOW (ref 40–190)
TIBC: 501 mcg/dL (calc) — ABNORMAL HIGH (ref 250–450)

## 2019-10-03 MED ORDER — TELMISARTAN-HCTZ 80-12.5 MG PO TABS: 1.0000 | ORAL_TABLET | Freq: Every day | ORAL | 0 refills | Status: DC

## 2019-10-03 NOTE — Progress Notes (Signed)
Vickie Galvan is a 44 y.o. female with the following history as recorded in [employer]:  Patient Active Problem List   Diagnosis Date Noted  . Rhinitis, chronic 06/26/2016  . Obstructive sleep apnea 01/02/2016  . Hyperglycemia 01/02/2016  . Ventral hernia 05/04/2014  . Epigastric hernia 01/30/2014  . Umbilical hernia 24/40/1027  . Preventative health care 05/04/2011  . Rash 05/04/2011  . Environmental allergies 10/27/2010  . Anxiety 10/27/2010  . Cervicalgia 08/20/2010  . GOITER, MULTINODULAR 05/13/2009  . MENORRHAGIA 03/01/2009  . FATIGUE 03/01/2009  . ANEMIA, IRON DEFICIENCY 08/03/2008    Current Outpatient Medications  Medication Sig Dispense Refill  . albuterol (PROAIR HFA) 108 (90 Base) MCG/ACT inhaler ProAir HFA 90 mcg/actuation aerosol inhaler  INHALE 2 PUFFS BY MOUTH EVERY 4 HOURS AS NEEDED FOR WHEEZE    . ALPRAZolam (XANAX) 0.25 MG tablet TAKE 1 TABLET BY MOUTH TWICE A DAY AS NEEDED ANXIETY 60 tablet 0  . potassium chloride (K-DUR) 10 MEQ tablet Take 1 tablet (10 mEq total) by mouth daily. 90 tablet 3  . telmisartan-hydrochlorothiazide (MICARDIS HCT) 80-12.5 MG tablet Take 1 tablet by mouth daily. 90 tablet 0   No current facility-administered medications for this visit.    Allergies: Azithromycin and Cephalexin  Past Medical History:  Diagnosis Date  . Anxiety   . Cough 09/17/2015  . Goiter   . Hypertension    states under control with med., has been on med. x 9 yr.  . Muscle diastasis 08/2015   abd.  . Stuffy and runny nose 09/17/2015   clear drainage from nose, per pt.    Past Surgical History:  Procedure Laterality Date  . ABDOMINOPLASTY Bilateral 09/23/2015   Procedure: EXPLORATION OF ABDOMEN REPAIR OF SCAR AND DIASTASIS ;  Surgeon: Cristine Polio, MD;  Location: [place];  Service: Plastics;  Laterality: Bilateral;  . BREAST BIOPSY Left 08/2018  . BREAST BIOPSY Left 04/2016  . CESAREAN SECTION  01/06/2005; 07/02/2006   X 2  .  COLONOSCOPY    . INSERTION OF MESH N/A 05/04/2014   Procedure: INSERTION OF MESH;  Surgeon: Fanny Skates, MD;  Location: [place];  Service: General;  Laterality: N/A;  . INSERTION OF MESH N/A 05/06/2015   Procedure: INSERTION OF MESH;  Surgeon: Cristine Polio, MD;  Location: [place];  Service: Plastics;  Laterality: N/A;  umbiical  . RIGHT OOPHORECTOMY  01/06/2005  . TUBAL LIGATION  07/02/2006  . VENTRAL HERNIA REPAIR N/A 05/04/2014   Procedure: LAPAROSCOPIC REPAIR MUTIPLE INCARCERATED VENTRAL HERNIAS;  Surgeon: Fanny Skates, MD;  Location: [place];  Service: General;  Laterality: N/A;  . VENTRAL HERNIA REPAIR N/A 05/06/2015   Procedure: EXPLORATION OF ABDOMIN, REPAIR RECURRENT VENTRAL HERNIA AND SEVERE DIASTASIS RECTI;  Surgeon: Cristine Polio, MD;  Location: [place];  Service: Plastics;  Laterality: N/A;    Family History  Problem Relation Age of Onset  . Hypertension Maternal Grandmother   . Hypertension Maternal Grandfather   . Diabetes Maternal Grandfather   . Stroke Maternal Grandfather   . Hypertension Mother     Social History   Tobacco Use  . Smoking status: Never Smoker  . Smokeless tobacco: Never Used  Substance Use Topics  . Alcohol use: Yes    Comment: occasionally    Subjective:  ER follow-up from this past weekend; has not been seen here in almost 3 years; history of hypertension/ hypokalemia; has not taken her medications regularly; Was found to be severely anemic- admits  that her periods are extremely heavy and has been discussing treatment options with her GYN to stop her periods; History of thyroid goiter- has not seen her endocrinologist in almost 6 years; TSH at ER was at 8;   Objective:  Vitals:   10/03/19 1215  BP: (!) 144/88  Pulse: (!) 104  Temp: 98.1 F (36.7 C)  TempSrc: Oral  SpO2: 98%  Weight: 148 lb 12.8 oz (67.5 kg)  Height: 5' 6"  (1.676 m)    General: Well developed, well nourished, in no acute  distress  Skin : Warm and dry.  Head: Normocephalic and atraumatic  Lungs: Respirations unlabored; clear to auscultation bilaterally without wheeze, rales, rhonchi  CVS exam: normal rate and regular rhythm.  Neurologic: Alert and oriented; speech intact; face symmetrical; moves all extremities well; CNII-XII intact without focal deficit   Assessment:  1. Hypothyroidism, unspecified type   2. Essential hypertension   3. Iron deficiency anemia, unspecified iron deficiency anemia type   4. Hypokalemia     Plan:  1. Refer back to her endocrinologist; will let them decide if medications need to be started; 2. Refill updated on Micardis HCT; stressed need to take her medication daily/ keep regular follow-ups; 3. Encouraged to see her GYN in follow-up; will update anemia panel today- will most likely need iron infusions as well;  4. Increase Kdur to 20 meq daily- she will take 2 of what she has at home; check CMP today;   This visit occurred during the SARS-CoV-2 public health emergency.  Safety protocols were in place, including screening questions prior to the visit, additional usage of staff PPE, and extensive cleaning of exam room while observing appropriate contact time as indicated for disinfecting solutions.      Return in about 1 week (around 10/10/2019) for with Dr. Jenny Reichmann.  Orders Placed This Encounter  Procedures  . Comp Met (CMET)  . Iron, TIBC and Ferritin Panel  . CBC with Differential/Platelet  . Ambulatory referral to Endocrinology    Referral Priority:   Routine    Referral Type:   Consultation    Referral Reason:   Specialty Services Required    Number of Visits Requested:   1    Requested Prescriptions   Signed Prescriptions Disp Refills  . telmisartan-hydrochlorothiazide (MICARDIS HCT) 80-12.5 MG tablet 90 tablet 0    Sig: Take 1 tablet by mouth daily.

## 2019-10-04 ENCOUNTER — Other Ambulatory Visit: Payer: Self-pay | Admitting: Family

## 2019-10-04 DIAGNOSIS — D509 Iron deficiency anemia, unspecified: Secondary | ICD-10-CM

## 2019-10-04 MED ORDER — POTASSIUM CHLORIDE ER 20 MEQ PO TBCR: 10.0000 meq | EXTENDED_RELEASE_TABLET | Freq: Two times a day (BID) | ORAL | 0 refills | Status: DC

## 2019-10-06 ENCOUNTER — Telehealth: Payer: Self-pay | Admitting: Hematology and Oncology

## 2019-10-06 NOTE — Telephone Encounter (Signed)
Received a new hem referral from Jodi Mourning at [facility] for [place]. Pt has been cld and scheduled to see Dr. Lindi Adie on 4/22 at 1pm. Pt aware to arrive 15 minutes early.

## 2019-10-10 DIAGNOSIS — Z01419 Encounter for gynecological examination (general) (routine) without abnormal findings: Secondary | ICD-10-CM | POA: Diagnosis not present

## 2019-10-10 DIAGNOSIS — Z6823 Body mass index (BMI) 23.0-23.9, adult: Secondary | ICD-10-CM | POA: Diagnosis not present

## 2019-10-11 ENCOUNTER — Ambulatory Visit: Payer: BC Managed Care – PPO | Admitting: Internal Medicine

## 2019-10-11 NOTE — Progress Notes (Signed)
[facility] NOTE  Patient Care Team: Biagio Borg, MD as PCP - General  CHIEF COMPLAINTS/PURPOSE OF CONSULTATION:  Newly diagnosed iron deficiency anemia  HISTORY OF PRESENTING ILLNESS:  Vickie Galvan 44 y.o. female is here because of recent diagnosis of iron deficiency anemia. She is referred by [facility]. Labs on 10/03/19 showed Hg 7.9, HCT 25.5, platelets 403, iron saturation 2%, ferritin 10. She is currently taking oral iron supplements. She has a history of heavy menstrual cycles. She presents to the clinic today for initial evaluation and discussion of treatment options.  She tells me that when she took iron tablets she had intense abdominal pain and could not tolerate them.  She had seen GYN regarding her heavy menstrual cycles and they are planning to perform an ablation procedure.     I reviewed her records extensively and collaborated the history with the patient.  MEDICAL HISTORY:  Past Medical History:  Diagnosis Date  . Anxiety   . Cough 09/17/2015  . Goiter   . Hypertension    states under control with med., has been on med. x 9 yr.  . Muscle diastasis 08/2015   abd.  . Stuffy and runny nose 09/17/2015   clear drainage from nose, per pt.    SURGICAL HISTORY: Past Surgical History:  Procedure Laterality Date  . ABDOMINOPLASTY Bilateral 09/23/2015   Procedure: EXPLORATION OF ABDOMEN REPAIR OF SCAR AND DIASTASIS ;  Surgeon: Cristine Polio, MD;  Location: [place];  Service: Plastics;  Laterality: Bilateral;  . BREAST BIOPSY Left 08/2018  . BREAST BIOPSY Left 04/2016  . CESAREAN SECTION  01/06/2005; 07/02/2006   X 2  . COLONOSCOPY    . INSERTION OF MESH N/A 05/04/2014   Procedure: INSERTION OF MESH;  Surgeon: Fanny Skates, MD;  Location: [place];  Service: General;  Laterality: N/A;  . INSERTION OF MESH N/A 05/06/2015   Procedure: INSERTION OF MESH;  Surgeon: Cristine Polio, MD;  Location: [place];  Service: Plastics;  Laterality: N/A;  umbiical  . RIGHT OOPHORECTOMY  01/06/2005  . TUBAL LIGATION  07/02/2006  . VENTRAL HERNIA REPAIR N/A 05/04/2014   Procedure: LAPAROSCOPIC REPAIR MUTIPLE INCARCERATED VENTRAL HERNIAS;  Surgeon: Fanny Skates, MD;  Location: [place];  Service: General;  Laterality: N/A;  . VENTRAL HERNIA REPAIR N/A 05/06/2015   Procedure: EXPLORATION OF ABDOMIN, REPAIR RECURRENT VENTRAL HERNIA AND SEVERE DIASTASIS RECTI;  Surgeon: Cristine Polio, MD;  Location: [place];  Service: Plastics;  Laterality: N/A;    SOCIAL HISTORY: Social History   Socioeconomic History  . Marital status: Married    Spouse name: Not on file  . Number of children: 3  . Years of education: Not on file  . Highest education level: Not on file  Occupational History  . Not on file  Tobacco Use  . Smoking status: Never Smoker  . Smokeless tobacco: Never Used  Substance and Sexual Activity  . Alcohol use: Yes    Comment: occasionally  . Drug use: No  . Sexual activity: Yes    Birth control/protection: Surgical  Other Topics Concern  . Not on file  Social History Narrative  . Not on file   Social Determinants of Health   Financial Resource Strain:   . Difficulty of Paying Living Expenses:   Food Insecurity:   . Worried About Charity fundraiser in the Last Year:   . [place] in the Last  Year:   Transportation Needs:   . Film/video editor (Medical):   Marland Kitchen Lack of Transportation (Non-Medical):   Physical Activity:   . Days of Exercise per Week:   . Minutes of Exercise per Session:   Stress:   . Feeling of Stress :   Social Connections:   . Frequency of Communication with Friends and Family:   . Frequency of Social Gatherings with Friends and Family:   . Attends Religious Services:   . Active Member of Clubs or Organizations:   . Attends Archivist Meetings:   Marland Kitchen Marital Status:   Intimate Partner Violence:   . Fear of Current or  Ex-Partner:   . Emotionally Abused:   Marland Kitchen Physically Abused:   . Sexually Abused:     FAMILY HISTORY: Family History  Problem Relation Age of Onset  . Hypertension Maternal Grandmother   . Hypertension Maternal Grandfather   . Diabetes Maternal Grandfather   . Stroke Maternal Grandfather   . Hypertension Mother     ALLERGIES:  is allergic to azithromycin and cephalexin.  MEDICATIONS:  Current Outpatient Medications  Medication Sig Dispense Refill  . albuterol (PROAIR HFA) 108 (90 Base) MCG/ACT inhaler ProAir HFA 90 mcg/actuation aerosol inhaler  INHALE 2 PUFFS BY MOUTH EVERY 4 HOURS AS NEEDED FOR WHEEZE    . ALPRAZolam (XANAX) 0.25 MG tablet TAKE 1 TABLET BY MOUTH TWICE A DAY AS NEEDED ANXIETY 60 tablet 0  . potassium chloride 20 MEQ TBCR Take 10 mEq by mouth 2 (two) times daily. 40 tablet 0  . telmisartan-hydrochlorothiazide (MICARDIS HCT) 80-12.5 MG tablet Take 1 tablet by mouth daily. 90 tablet 0   No current facility-administered medications for this visit.    REVIEW OF SYSTEMS:   Constitutional: Denies fevers, chills or abnormal night sweats Eyes: Denies blurriness of vision, double vision or watery eyes Ears, nose, mouth, throat, and face: Denies mucositis or sore throat Respiratory: Denies cough, dyspnea or wheezes Cardiovascular: Denies palpitation, chest discomfort or lower extremity swelling Gastrointestinal:  Denies nausea, heartburn or change in bowel habits Skin: Denies abnormal skin rashes Lymphatics: Denies new lymphadenopathy or easy bruising Neurological:Denies numbness, tingling or new weaknesses Behavioral/Psych: Mood is stable, no new changes  Breast: Denies any palpable lumps or discharge All other systems were reviewed with the patient and are negative.  PHYSICAL EXAMINATION: ECOG PERFORMANCE STATUS: 1 - Symptomatic but completely ambulatory  Vitals:   10/12/19 1316  BP: 129/84  Pulse: 94  Resp: 18  Temp: 98.3 F (36.8 C)  SpO2: 100%   Filed  Weights   10/12/19 1316  Weight: 146 lb 4.8 oz (66.4 kg)    GENERAL:alert, no distress and comfortable SKIN: skin color, texture, turgor are normal, no rashes or significant lesions EYES: normal, conjunctiva are pink and non-injected, sclera clear OROPHARYNX:no exudate, no erythema and lips, buccal mucosa, and tongue normal  NECK: supple, thyroid normal size, non-tender, without nodularity LYMPH:  no palpable lymphadenopathy in the cervical, axillary or inguinal LUNGS: clear to auscultation and percussion with normal breathing effort HEART: regular rate & rhythm and no murmurs and no lower extremity edema ABDOMEN:abdomen soft, non-tender and normal bowel sounds Musculoskeletal:no cyanosis of digits and no clubbing  PSYCH: alert & oriented x 3 with fluent speech NEURO: no focal motor/sensory deficits  LABORATORY DATA:  I have reviewed the data as listed Lab Results  Component Value Date   WBC 4.9 10/03/2019   HGB 7.9 Repeated and verified X2. (LL) 10/03/2019   HCT 25.5  Repeated and verified X2. (L) 10/03/2019   MCV 81.7 10/03/2019   PLT 403.0 (H) 10/03/2019   Lab Results  Component Value Date   NA 137 10/03/2019   K 2.8 (LL) 10/03/2019   CL 96 10/03/2019   CO2 32 10/03/2019    RADIOGRAPHIC STUDIES: I have personally reviewed the radiological reports and agreed with the findings in the report.  ASSESSMENT AND PLAN:  ANEMIA, IRON DEFICIENCY Lab review: Hemoglobin 7.9, MCV 81.7, RDW 22.4, platelets 403, AST 105, ALT 37, potassium 2.8, serum iron 11, TIBC 501, iron saturation 2%, ferritin 10 Cause of iron deficiency: Heavy menstrual cycles Treatment plan: I recommended intravenous iron therapy to prevent further worsening of anemia.    Return to clinic in 3 month after iron infusion to recheck her labs.  We will do labs 2 days ahead of the visit.  If the heavy menstrual cycle stopped then she is likely not going to need IV iron in the future. We will reassess her in 3 months  and then decide on the frequency of checkups and or iron infusions.   All questions were answered. The patient knows to call the clinic with any problems, questions or concerns.   Rulon Eisenmenger, MD, MPH 10/12/2019    I, Molly Dorshimer, am acting as scribe for Nicholas Lose, MD.  I have reviewed the above documentation for accuracy and completeness, and I agree with the above.

## 2019-10-12 ENCOUNTER — Inpatient Hospital Stay: Payer: BC Managed Care – PPO | Attending: Hematology and Oncology | Admitting: Hematology and Oncology

## 2019-10-12 ENCOUNTER — Other Ambulatory Visit: Payer: Self-pay

## 2019-10-12 ENCOUNTER — Telehealth: Payer: Self-pay | Admitting: Hematology and Oncology

## 2019-10-12 DIAGNOSIS — I10 Essential (primary) hypertension: Secondary | ICD-10-CM | POA: Insufficient documentation

## 2019-10-12 DIAGNOSIS — F419 Anxiety disorder, unspecified: Secondary | ICD-10-CM | POA: Diagnosis not present

## 2019-10-12 DIAGNOSIS — D509 Iron deficiency anemia, unspecified: Secondary | ICD-10-CM | POA: Insufficient documentation

## 2019-10-12 DIAGNOSIS — Z79899 Other long term (current) drug therapy: Secondary | ICD-10-CM | POA: Diagnosis not present

## 2019-10-12 MED ORDER — AMOXICILLIN 500 MG PO CAPS: 500.0000 mg | ORAL_CAPSULE | Freq: Two times a day (BID) | ORAL | 0 refills | Status: DC

## 2019-10-12 NOTE — Assessment & Plan Note (Signed)
Lab review: Hemoglobin 7.9, MCV 81.7, RDW 22.4, platelets 403, AST 105, ALT 37, potassium 2.8, serum iron 11, TIBC 501, iron saturation 2%, ferritin 10 Cause of iron deficiency: Heavy menstrual cycles Treatment plan: I recommended intravenous iron therapy to prevent further worsening of anemia.  We also discussed the role of blood transfusion for symptomatic anemia.  Return to clinic in 1 month after iron infusion to recheck her labs.  We will do labs 2 days ahead of the visit.

## 2019-10-12 NOTE — Telephone Encounter (Signed)
Scheduled per 04/22 los, patient has been called and notified.  

## 2019-10-19 ENCOUNTER — Inpatient Hospital Stay: Payer: BC Managed Care – PPO

## 2019-10-20 ENCOUNTER — Other Ambulatory Visit: Payer: Self-pay

## 2019-10-20 ENCOUNTER — Ambulatory Visit: Payer: BC Managed Care – PPO | Admitting: Internal Medicine

## 2019-10-20 ENCOUNTER — Inpatient Hospital Stay: Payer: BC Managed Care – PPO

## 2019-10-20 VITALS — BP 152/103 | HR 93 | Temp 98.2°F | Resp 18

## 2019-10-20 DIAGNOSIS — D509 Iron deficiency anemia, unspecified: Secondary | ICD-10-CM | POA: Diagnosis not present

## 2019-10-20 DIAGNOSIS — F419 Anxiety disorder, unspecified: Secondary | ICD-10-CM | POA: Diagnosis not present

## 2019-10-20 DIAGNOSIS — Z79899 Other long term (current) drug therapy: Secondary | ICD-10-CM | POA: Diagnosis not present

## 2019-10-20 DIAGNOSIS — I10 Essential (primary) hypertension: Secondary | ICD-10-CM | POA: Diagnosis not present

## 2019-10-20 MED ORDER — SODIUM CHLORIDE 0.9 % IV SOLN
Freq: Once | INTRAVENOUS | Status: AC
  Filled 2019-10-20: qty 250

## 2019-10-20 MED ORDER — ACETAMINOPHEN 325 MG PO TABS
650.0000 mg | ORAL_TABLET | Freq: Once | ORAL | Status: AC
  Administered 2019-10-20: 650 mg via ORAL

## 2019-10-20 MED ORDER — ACETAMINOPHEN 325 MG PO TABS
ORAL_TABLET | ORAL | Status: AC
  Filled 2019-10-20: qty 2

## 2019-10-20 MED ORDER — SODIUM CHLORIDE 0.9 % IV SOLN
300.0000 mg | Freq: Once | INTRAVENOUS | Status: AC
  Administered 2019-10-20: 300 mg via INTRAVENOUS
  Filled 2019-10-20: qty 10

## 2019-10-20 NOTE — Patient Instructions (Signed)
Iron Sucrose (Venofer) injection What is this medicine? IRON SUCROSE (AHY ern SOO krohs) is an iron complex. Iron is used to make healthy red blood cells, which carry oxygen and nutrients throughout the body. This medicine is used to treat iron deficiency anemia in people with chronic kidney disease. This medicine may be used for other purposes; ask your health care provider or pharmacist if you have questions. COMMON BRAND NAME(S): Venofer What should I tell my health care provider before I take this medicine? They need to know if you have any of these conditions:  anemia not caused by low iron levels  heart disease  high levels of iron in the blood  kidney disease  liver disease  an unusual or allergic reaction to iron, other medicines, foods, dyes, or preservatives  pregnant or trying to get pregnant  breast-feeding How should I use this medicine? This medicine is for infusion into a vein. It is given by a health care professional in a hospital or clinic setting. Talk to your pediatrician regarding the use of this medicine in children. While this drug may be prescribed for children as young as 2 years for selected conditions, precautions do apply. Overdosage: If you think you have taken too much of this medicine contact a poison control center or emergency room at once. NOTE: This medicine is only for you. Do not share this medicine with others. What if I miss a dose? It is important not to miss your dose. Call your doctor or health care professional if you are unable to keep an appointment. What may interact with this medicine? Do not take this medicine with any of the following medications:  deferoxamine  dimercaprol  other iron products This medicine may also interact with the following medications:  chloramphenicol  deferasirox This list may not describe all possible interactions. Give your health care provider a list of all the medicines, herbs, non-prescription  drugs, or dietary supplements you use. Also tell them if you smoke, drink alcohol, or use illegal drugs. Some items may interact with your medicine. What should I watch for while using this medicine? Visit your doctor or healthcare professional regularly. Tell your doctor or healthcare professional if your symptoms do not start to get better or if they get worse. You may need blood work done while you are taking this medicine. You may need to follow a special diet. Talk to your doctor. Foods that contain iron include: whole grains/cereals, dried fruits, beans, or peas, leafy green vegetables, and organ meats (liver, kidney). What side effects may I notice from receiving this medicine? Side effects that you should report to your doctor or health care professional as soon as possible:  allergic reactions like skin rash, itching or hives, swelling of the face, lips, or tongue  breathing problems  changes in blood pressure  cough  fast, irregular heartbeat  feeling faint or lightheaded, falls  fever or chills  flushing, sweating, or hot feelings  joint or muscle aches/pains  seizures  swelling of the ankles or feet  unusually weak or tired Side effects that usually do not require medical attention (report to your doctor or health care professional if they continue or are bothersome):  diarrhea  feeling achy  headache  irritation at site where injected  nausea, vomiting  stomach upset  tiredness This list may not describe all possible side effects. Call your doctor for medical advice about side effects. You may report side effects to FDA at 1-800-FDA-1088. Where should I  keep my medicine? This drug is given in a hospital or clinic and will not be stored at home. NOTE: This sheet is a summary. It may not cover all possible information. If you have questions about this medicine, talk to your doctor, pharmacist, or health care provider.  2020 Elsevier/Gold Standard  (2011-03-19 17:14:35)

## 2019-10-26 ENCOUNTER — Inpatient Hospital Stay: Payer: BC Managed Care – PPO | Attending: Hematology and Oncology

## 2019-10-26 ENCOUNTER — Ambulatory Visit: Payer: BC Managed Care – PPO

## 2019-10-26 ENCOUNTER — Other Ambulatory Visit: Payer: Self-pay

## 2019-10-26 VITALS — BP 136/93 | HR 100 | Temp 98.9°F | Resp 18

## 2019-10-26 DIAGNOSIS — D509 Iron deficiency anemia, unspecified: Secondary | ICD-10-CM | POA: Diagnosis not present

## 2019-10-26 MED ORDER — SODIUM CHLORIDE 0.9 % IV SOLN
200.0000 mg | Freq: Once | INTRAVENOUS | Status: AC
  Administered 2019-10-26: 200 mg via INTRAVENOUS
  Filled 2019-10-26: qty 200

## 2019-10-26 MED ORDER — SODIUM CHLORIDE 0.9 % IV SOLN
Freq: Once | INTRAVENOUS | Status: AC
  Filled 2019-10-26: qty 250

## 2019-10-26 MED ORDER — ACETAMINOPHEN 325 MG PO TABS
650.0000 mg | ORAL_TABLET | Freq: Once | ORAL | Status: AC
  Administered 2019-10-26: 650 mg via ORAL

## 2019-10-26 MED ORDER — ACETAMINOPHEN 325 MG PO TABS
ORAL_TABLET | ORAL | Status: AC
  Filled 2019-10-26: qty 2

## 2019-10-26 NOTE — Patient Instructions (Signed)

## 2019-10-27 ENCOUNTER — Inpatient Hospital Stay: Payer: BC Managed Care – PPO

## 2019-10-27 ENCOUNTER — Other Ambulatory Visit: Payer: Self-pay

## 2019-10-27 VITALS — BP 162/98 | HR 94 | Temp 98.8°F | Resp 18

## 2019-10-27 DIAGNOSIS — D509 Iron deficiency anemia, unspecified: Secondary | ICD-10-CM | POA: Diagnosis not present

## 2019-10-27 MED ORDER — ACETAMINOPHEN 325 MG PO TABS
ORAL_TABLET | ORAL | Status: AC
  Filled 2019-10-27: qty 2

## 2019-10-27 MED ORDER — ACETAMINOPHEN 325 MG PO TABS
650.0000 mg | ORAL_TABLET | Freq: Once | ORAL | Status: AC
  Administered 2019-10-27: 650 mg via ORAL

## 2019-10-27 MED ORDER — SODIUM CHLORIDE 0.9 % IV SOLN
Freq: Once | INTRAVENOUS | Status: AC
  Filled 2019-10-27: qty 250

## 2019-10-27 MED ORDER — SODIUM CHLORIDE 0.9 % IV SOLN
200.0000 mg | Freq: Once | INTRAVENOUS | Status: AC
  Administered 2019-10-27: 15:00:00 200 mg via INTRAVENOUS
  Filled 2019-10-27: qty 200

## 2019-10-27 NOTE — Patient Instructions (Signed)

## 2019-10-31 ENCOUNTER — Other Ambulatory Visit: Payer: Self-pay

## 2019-10-31 ENCOUNTER — Inpatient Hospital Stay: Payer: BC Managed Care – PPO

## 2019-10-31 VITALS — BP 157/111 | HR 92 | Temp 98.2°F | Resp 20

## 2019-10-31 DIAGNOSIS — D509 Iron deficiency anemia, unspecified: Secondary | ICD-10-CM | POA: Diagnosis not present

## 2019-10-31 MED ORDER — SODIUM CHLORIDE 0.9 % IV SOLN
300.0000 mg | Freq: Once | INTRAVENOUS | Status: AC
  Administered 2019-10-31: 300 mg via INTRAVENOUS
  Filled 2019-10-31: qty 10

## 2019-10-31 MED ORDER — ACETAMINOPHEN 325 MG PO TABS
ORAL_TABLET | ORAL | Status: AC
  Filled 2019-10-31: qty 2

## 2019-10-31 MED ORDER — SODIUM CHLORIDE 0.9 % IV SOLN
Freq: Once | INTRAVENOUS | Status: AC
  Filled 2019-10-31: qty 250

## 2019-10-31 MED ORDER — ACETAMINOPHEN 325 MG PO TABS
650.0000 mg | ORAL_TABLET | Freq: Once | ORAL | Status: AC
  Administered 2019-10-31: 650 mg via ORAL

## 2019-10-31 NOTE — Patient Instructions (Signed)

## 2019-11-01 ENCOUNTER — Ambulatory Visit: Payer: BC Managed Care – PPO | Admitting: Internal Medicine

## 2019-11-01 ENCOUNTER — Telehealth: Payer: Self-pay | Admitting: *Deleted

## 2019-11-01 ENCOUNTER — Other Ambulatory Visit: Payer: Self-pay

## 2019-11-01 ENCOUNTER — Encounter: Payer: Self-pay | Admitting: Internal Medicine

## 2019-11-01 VITALS — BP 160/100 | HR 115 | Temp 98.9°F | Ht 66.0 in | Wt 146.5 lb

## 2019-11-01 DIAGNOSIS — I10 Essential (primary) hypertension: Secondary | ICD-10-CM | POA: Diagnosis not present

## 2019-11-01 DIAGNOSIS — F419 Anxiety disorder, unspecified: Secondary | ICD-10-CM | POA: Diagnosis not present

## 2019-11-01 DIAGNOSIS — R739 Hyperglycemia, unspecified: Secondary | ICD-10-CM

## 2019-11-01 MED ORDER — AMLODIPINE BESYLATE 10 MG PO TABS
10.0000 mg | ORAL_TABLET | Freq: Every day | ORAL | 3 refills | Status: DC
Start: 2019-11-01 — End: 2020-12-13

## 2019-11-01 NOTE — Patient Instructions (Addendum)
Please take all new medication as prescribed - the amlodipine  Please continue all other medications as before, and refills have been done if requested.  Please have the pharmacy call with any other refills you may need.  Please continue your efforts at being more active, low cholesterol diet, and weight control.  Please keep your appointments with your specialists as you may have planned  Please check your BP at home several times in after the first 3 wks, and let us know on the Mychart if the average is still more than 140/90  Please make an Appointment to return in 6 months, or sooner if needed

## 2019-11-01 NOTE — Telephone Encounter (Signed)
Received call from pt stating BP is 160/100.  Pt wanting to know if IV iron could be the cause of hypertension.  Pt asymptomatic and states she has been taking her blood pressure medication as prescribed by pcp.  Pt also states she has been more anxious lately as well which could be the cause of hypertension.  Per MD pt has hx of hypertension and is currently on medication to control it.  MD suggest pt follow up with PCP for further evaluation and treatment.  Pt verbalized understanding and will contact PCP.

## 2019-11-01 NOTE — Assessment & Plan Note (Signed)
stable overall by history and exam, recent data reviewed with pt, and pt to continue medical treatment as before,  to f/u any worsening symptoms or concerns  

## 2019-11-01 NOTE — Assessment & Plan Note (Addendum)
Uncontrolled, to add amlodipine 10 qd, cont all other tx, to check bp at home and next visit  I spent 31 minutes in preparing to see the patient by review of recent labs, imaging and procedures, obtaining and reviewing separately obtained history, communicating with the patient and family or caregiver, ordering medications, tests or procedures, and documenting clinical information in the EHR including the differential Dx, treatment, and any further evaluation and other management of htn, hyperglycemia, anxiety

## 2019-11-01 NOTE — Progress Notes (Signed)
Subjective:    Patient ID: Vickie Galvan, female    DOB: [birth year], 44 y.o.   MRN: [id number]  HPI  Here to f/u; overall doing ok,  Pt denies chest pain, increasing sob or doe, wheezing, orthopnea, PND, increased LE swelling, palpitations, dizziness or syncope.  Pt denies new neurological symptoms such as new headache, or facial or extremity weakness or numbness.  Pt denies polydipsia, polyuria, or low sugar episode.  Pt states overall good compliance with meds, mostly trying to follow appropriate diet, with wt overall stable,  but little exercise however.    bp remains elevated  Denies worsening depressive symptoms, suicidal ideation, or panic BP Readings from Last 3 Encounters:  11/01/19 (!) 160/100  10/31/19 (!) 157/111  10/27/19 (!) 162/98   Past Medical History:  Diagnosis Date  . Anxiety   . Cough 09/17/2015  . Goiter   . Hypertension    states under control with med., has been on med. x 9 yr.  . Muscle diastasis 08/2015   abd.  . Stuffy and runny nose 09/17/2015   clear drainage from nose, per pt.   Past Surgical History:  Procedure Laterality Date  . ABDOMINOPLASTY Bilateral 09/23/2015   Procedure: EXPLORATION OF ABDOMEN REPAIR OF SCAR AND DIASTASIS ;  Surgeon: Cristine Polio, MD;  Location: [place];  Service: Plastics;  Laterality: Bilateral;  . BREAST BIOPSY Left 08/2018  . BREAST BIOPSY Left 04/2016  . CESAREAN SECTION  01/06/2005; 07/02/2006   X 2  . COLONOSCOPY    . INSERTION OF MESH N/A 05/04/2014   Procedure: INSERTION OF MESH;  Surgeon: Fanny Skates, MD;  Location: [place];  Service: General;  Laterality: N/A;  . INSERTION OF MESH N/A 05/06/2015   Procedure: INSERTION OF MESH;  Surgeon: Cristine Polio, MD;  Location: [place];  Service: Plastics;  Laterality: N/A;  umbiical  . RIGHT OOPHORECTOMY  01/06/2005  . TUBAL LIGATION  07/02/2006  . VENTRAL HERNIA REPAIR N/A 05/04/2014   Procedure: LAPAROSCOPIC REPAIR MUTIPLE  INCARCERATED VENTRAL HERNIAS;  Surgeon: Fanny Skates, MD;  Location: [place];  Service: General;  Laterality: N/A;  . 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 She has never used smokeless tobacco. She reports current alcohol use. She reports that she does not use drugs. family history includes Diabetes in her maternal grandfather; Hypertension in her maternal grandfather, maternal grandmother, and mother; Stroke in her maternal grandfather. Allergies  Allergen Reactions  . Azithromycin Other (See Comments)    ABD. PAIN  . Cephalexin     Other reaction(s): Abdominal Pain   Current Outpatient Medications on File Prior to Visit  Medication Sig Dispense Refill  . albuterol (PROAIR HFA) 108 (90 Base) MCG/ACT inhaler ProAir HFA 90 mcg/actuation aerosol inhaler  INHALE 2 PUFFS BY MOUTH EVERY 4 HOURS AS NEEDED FOR WHEEZE    . ALPRAZolam (XANAX) 0.25 MG tablet TAKE 1 TABLET BY MOUTH TWICE A DAY AS NEEDED ANXIETY 60 tablet 0  . amoxicillin (AMOXIL) 500 MG capsule Take 1 capsule (500 mg total) by mouth 2 (two) times daily. 20 capsule 0  . potassium chloride 20 MEQ TBCR Take 10 mEq by mouth 2 (two) times daily. 40 tablet 0  . telmisartan-hydrochlorothiazide (MICARDIS HCT) 80-12.5 MG tablet Take 1  tablet by mouth daily. 90 tablet 0   No current facility-administered medications on file prior to visit.   Review of Systems All otherwise neg per pt     Objective:   Physical Exam BP (!) 160/100 (BP Location: Left Arm, Patient Position: Sitting, Cuff Size: Large)   Pulse (!) 115   Temp 98.9 F (37.2 C) (Oral)   Ht 5\' 6"  (1.676 m)   Wt 146 lb 8 oz (66.5 kg)   SpO2 99%   BMI 23.65 kg/m  VS noted,  Constitutional: Pt appears in NAD HENT: Head: NCAT.  Right Ear:  External ear normal.  Left Ear: External ear normal.  Eyes: . Pupils are equal, round, and reactive to light. Conjunctivae and EOM are normal Nose: without d/c or deformity Neck: Neck supple. Gross normal ROM Cardiovascular: Normal rate and regular rhythm.   Pulmonary/Chest: Effort normal and breath sounds without rales or wheezing.  Abd:  Soft, NT, ND, + BS, no organomegaly Neurological: Pt is alert. At baseline orientation, motor grossly intact Skin: Skin is warm. No rashes, other new lesions, no LE edema Psychiatric: Pt behavior is normal without agitation  All otherwise neg per pt Lab Results  Component Value Date   WBC 4.9 10/03/2019   HGB 7.9 Repeated and verified X2. (LL) 10/03/2019   HCT 25.5 Repeated and verified X2. (L) 10/03/2019   PLT 403.0 (H) 10/03/2019   GLUCOSE 112 (H) 10/03/2019   CHOL 201 (H) 01/02/2016   TRIG 48.0 01/02/2016   HDL 55.70 01/02/2016   LDLDIRECT 124.5 05/24/2012   LDLCALC 135 (H) 01/02/2016   ALT 37 (H) 10/03/2019   AST 105 (H) 10/03/2019   NA 137 10/03/2019   K 2.8 (LL) 10/03/2019   CL 96 10/03/2019   CREATININE 0.90 10/03/2019   BUN 12 10/03/2019   CO2 32 10/03/2019   TSH 8.646 (H) 09/29/2019   HGBA1C 5.9 01/02/2016      Assessment & Plan:

## 2019-11-02 ENCOUNTER — Other Ambulatory Visit: Payer: Self-pay

## 2019-11-06 ENCOUNTER — Other Ambulatory Visit: Payer: Self-pay

## 2019-11-06 ENCOUNTER — Encounter: Payer: Self-pay | Admitting: Endocrinology

## 2019-11-06 ENCOUNTER — Ambulatory Visit: Payer: BC Managed Care – PPO | Admitting: Endocrinology

## 2019-11-06 DIAGNOSIS — E039 Hypothyroidism, unspecified: Secondary | ICD-10-CM | POA: Insufficient documentation

## 2019-11-06 DIAGNOSIS — E890 Postprocedural hypothyroidism: Secondary | ICD-10-CM

## 2019-11-06 MED ORDER — LEVOTHYROXINE SODIUM 50 MCG PO TABS: 50.0000 ug | ORAL_TABLET | Freq: Every day | ORAL | 3 refills | Status: DC

## 2019-11-06 NOTE — Patient Instructions (Addendum)
I have sent a prescription to your pharmacy, for a thyroid hormone pill.  Please redo the blood tests in 1 month.  Please call ahead, so the lab can expect you. Please come back for a follow-up appointment in 6 months.

## 2019-11-06 NOTE — Progress Notes (Signed)
Subjective:    Patient ID: Vickie Galvan, female    DOB: Nov 10, 1975, 44 y.o.   MRN: [id number]  HPI Pt is ref by Dr Jenny Reichmann, for multinodular goiter (I last saw this pt in 2015; MNG was dx'ed 2010--bx was benign then; she developed a suppressed TSH in 2013, and was rx'ed with RAI in 2014; she became euthyroid off any thyroid rx; f/u US in early 2015 showed slight decrease in size of the goiter; she has never been on thyroid medication).    Past Medical History:  Diagnosis Date  . Anxiety   . Cough 09/17/2015  . Goiter   . Hypertension    states under control with med., has been on med. x 9 yr.  . Muscle diastasis 08/2015   abd.  . Stuffy and runny nose 09/17/2015   clear drainage from nose, per pt.    Past Surgical History:  Procedure Laterality Date  . ABDOMINOPLASTY Bilateral 09/23/2015   Procedure: EXPLORATION OF ABDOMEN REPAIR OF SCAR AND DIASTASIS ;  Surgeon: Cristine Polio, MD;  Location: [facility];  Service: Plastics;  Laterality: Bilateral;  . BREAST BIOPSY Left 08/2018  . BREAST BIOPSY Left 04/2016  . CESAREAN SECTION  01/06/2005; 07/02/2006   X 2  . COLONOSCOPY    . INSERTION OF MESH N/A 05/04/2014   Procedure: INSERTION OF MESH;  Surgeon: Fanny Skates, MD;  Location: [facility];  Service: General;  Laterality: N/A;  . INSERTION OF MESH N/A 05/06/2015   Procedure: INSERTION OF MESH;  Surgeon: Cristine Polio, MD;  Location: [facility];  Service: Plastics;  Laterality: N/A;  umbiical  . RIGHT OOPHORECTOMY  01/06/2005  . TUBAL LIGATION  07/02/2006  . VENTRAL HERNIA REPAIR N/A 05/04/2014   Procedure: LAPAROSCOPIC REPAIR MUTIPLE INCARCERATED VENTRAL HERNIAS;  Surgeon: Fanny Skates, MD;  Location: [facility];  Service: General;  Laterality: N/A;  . VENTRAL HERNIA REPAIR N/A 05/06/2015   Procedure: EXPLORATION OF ABDOMIN, REPAIR RECURRENT VENTRAL HERNIA AND SEVERE DIASTASIS RECTI;  Surgeon: Cristine Polio, MD;  Location: [facility];   Service: Plastics;  Laterality: N/A;    Social History   Socioeconomic History  . Marital status: Married    Spouse name: Not on file  . Number of children: 3  . Years of education: Not on file  . Highest education level: Not on file  Occupational History  . Not on file  Tobacco Use  . Smoking status: Never Smoker  . Smokeless tobacco: Never Used  Substance and Sexual Activity  . Alcohol use: Yes    Comment: occasionally  . Drug use: No  . Sexual activity: Yes    Birth control/protection: Surgical  Other Topics Concern  . Not on file  Social History Narrative  . Not on file   Social Determinants of Health   Financial Resource Strain:   . Difficulty of Paying Living Expenses:   Food Insecurity:   . Worried About Charity fundraiser in the Last Year:   . Arboriculturist in the Last Year:   Transportation Needs:   . Film/video editor (Medical):   Marland Kitchen Lack of Transportation (Non-Medical):   Physical Activity:   . Days of Exercise per Week:   . Minutes of Exercise per Session:   Stress:   . Feeling of Stress :   Social Connections:   . Frequency of Communication with Friends and Family:   . Frequency of Social Gatherings with Friends and Family:   .  Attends Religious Services:   . Active Member of Clubs or Organizations:   . Attends Archivist Meetings:   Marland Kitchen Marital Status:   Intimate Partner Violence:   . Fear of Current or Ex-Partner:   . Emotionally Abused:   Marland Kitchen Physically Abused:   . Sexually Abused:     Current Outpatient Medications on File Prior to Visit  Medication Sig Dispense Refill  . albuterol (PROAIR HFA) 108 (90 Base) MCG/ACT inhaler ProAir HFA 90 mcg/actuation aerosol inhaler  INHALE 2 PUFFS BY MOUTH EVERY 4 HOURS AS NEEDED FOR WHEEZE    . ALPRAZolam (XANAX) 0.25 MG tablet TAKE 1 TABLET BY MOUTH TWICE A DAY AS NEEDED ANXIETY 60 tablet 0  . amLODipine (NORVASC) 10 MG tablet Take 1 tablet (10 mg total) by mouth daily. 90 tablet 3  .  potassium chloride 20 MEQ TBCR Take 10 mEq by mouth 2 (two) times daily. 40 tablet 0  . telmisartan-hydrochlorothiazide (MICARDIS HCT) 80-12.5 MG tablet Take 1 tablet by mouth daily. 90 tablet 0   No current facility-administered medications on file prior to visit.    Allergies  Allergen Reactions  . Azithromycin Other (See Comments)    ABD. PAIN  . Cephalexin     Other reaction(s): Abdominal Pain    Family History  Problem Relation Age of Onset  . Hypertension Maternal Grandmother   . Hypertension Maternal Grandfather   . Diabetes Maternal Grandfather   . Stroke Maternal Grandfather   . Hypertension Mother     BP 130/80   Pulse (!) 117   Ht 5\' 6"  (1.676 m)   Wt 146 lb (66.2 kg)   SpO2 99%   BMI 23.57 kg/m   Review of Systems denies depression, muscle cramps, weight gain, memory loss, constipation, numbness, cold intolerance, and dry skin.     Objective:   Physical Exam VITAL SIGNS:  See vs page GENERAL: no distress HEAD: head: no deformity eyes: no periorbital swelling, no proptosis external nose and ears are normal NECK: right and left thyroid nodules are palpable.   CHEST WALL: no deformity MUSCULOSKELETAL: muscle bulk and strength are grossly normal.  no obvious joint swelling.  gait is normal and steady EXTEMITIES: no deformity.  no leg edema PULSES: no carotid bruit NEURO:  cn 2-12 grossly intact. readily moves all 4's.  sensation is intact to touch on all 4's.   SKIN:  Normal texture and temperature.  No rash or suspicious lesion is visible.   NODES:  None palpable at the neck PSYCH: alert, well-oriented.  Does not appear anxious nor depressed.     Lab Results  Component Value Date   TSH 8.646 (H) 09/29/2019   I have reviewed outside records, and summarized: Pt was noted to have elevated TSH, and referred here.  She has been seen in ER/UC several times in the past year, for different sxs.       Assessment & Plan:  Post-RAI hypothyroidism, new.     MNG: she declines f/u US, due to cost.    Tachycardia, prob due to anemia.    Patient Instructions  I have sent a prescription to your pharmacy, for a thyroid hormone pill.  Please redo the blood tests in 1 month.  Please call ahead, so the lab can expect you. Please come back for a follow-up appointment in 6 months.

## 2019-11-09 DIAGNOSIS — Z6823 Body mass index (BMI) 23.0-23.9, adult: Secondary | ICD-10-CM | POA: Diagnosis not present

## 2019-11-09 DIAGNOSIS — N920 Excessive and frequent menstruation with regular cycle: Secondary | ICD-10-CM | POA: Diagnosis not present

## 2019-11-21 ENCOUNTER — Telehealth: Payer: Self-pay | Admitting: Internal Medicine

## 2019-11-21 DIAGNOSIS — E876 Hypokalemia: Secondary | ICD-10-CM

## 2019-11-21 NOTE — Telephone Encounter (Signed)
New Message:   1.Medication Requested: potassium chloride 20 MEQ TBCR 2. Pharmacy (Name, Street, [place]): [id number] [place], [place] 3. On Med List: Yes  4. Last Visit with PCP:  11/01/19  5. Next visit date with PCP:   Pt would also like to know what kind of sinus medication she should be taking since she is on other medications. She states she feels very congested. Please advise.  Agent: Please be advised that RX refills may take up to 3 business days. We ask that you follow-up with your pharmacy.

## 2019-11-22 NOTE — Telephone Encounter (Signed)
She was supposed to get her potassium checked at her OV in May? Not sure why this was not done but it is very important that she get it checked.  Since she was only given 20 days of the medication in mid April, is she currently taking it. We need to know this so we know how to adjust her prescription when we see her lab result.  She needs to go to [facility] to have this level re-checked- order in place.  She can purchase OTC Claritin and Flonase to help with her congestion.

## 2019-11-22 NOTE — Telephone Encounter (Signed)
Spoke with patient and info given 

## 2019-12-13 ENCOUNTER — Other Ambulatory Visit (INDEPENDENT_AMBULATORY_CARE_PROVIDER_SITE_OTHER): Payer: BC Managed Care – PPO

## 2019-12-13 ENCOUNTER — Telehealth: Payer: Self-pay | Admitting: Internal Medicine

## 2019-12-13 DIAGNOSIS — E876 Hypokalemia: Secondary | ICD-10-CM | POA: Diagnosis not present

## 2019-12-13 LAB — BASIC METABOLIC PANEL
BUN: 26 mg/dL — ABNORMAL HIGH (ref 6–23)
CO2: 25 mEq/L (ref 19–32)
Calcium: 9.7 mg/dL (ref 8.4–10.5)
Chloride: 100 mEq/L (ref 96–112)
Creatinine, Ser: 1.15 mg/dL (ref 0.40–1.20)
GFR: 61.97 mL/min (ref >60.00)
Glucose, Bld: 120 mg/dL — ABNORMAL HIGH (ref 70–99)
Potassium: 3.9 mEq/L (ref 3.5–5.1)
Sodium: 135 mEq/L (ref 135–145)

## 2019-12-13 NOTE — Telephone Encounter (Signed)
    Please refill potassium chloride 20 MEQ TBCR Pharmacy [id number] [place], [place] - [address]

## 2019-12-14 ENCOUNTER — Other Ambulatory Visit: Payer: Self-pay

## 2019-12-14 MED ORDER — POTASSIUM CHLORIDE ER 20 MEQ PO TBCR: 10.0000 meq | EXTENDED_RELEASE_TABLET | Freq: Two times a day (BID) | ORAL | 0 refills | Status: DC

## 2019-12-14 NOTE — Telephone Encounter (Signed)
  Patient calling for status of refill. She is leaving to go out of town, requesting refill today

## 2019-12-14 NOTE — Telephone Encounter (Signed)
Sent it in. Pt should be able to pick up meds this morning.

## 2019-12-27 ENCOUNTER — Other Ambulatory Visit: Payer: Self-pay | Admitting: Family

## 2019-12-27 DIAGNOSIS — Z3202 Encounter for pregnancy test, result negative: Secondary | ICD-10-CM | POA: Diagnosis not present

## 2019-12-27 DIAGNOSIS — Z6823 Body mass index (BMI) 23.0-23.9, adult: Secondary | ICD-10-CM | POA: Diagnosis not present

## 2019-12-27 DIAGNOSIS — Z30430 Encounter for insertion of intrauterine contraceptive device: Secondary | ICD-10-CM | POA: Diagnosis not present

## 2020-01-08 ENCOUNTER — Other Ambulatory Visit: Payer: Self-pay | Admitting: *Deleted

## 2020-01-08 DIAGNOSIS — D509 Iron deficiency anemia, unspecified: Secondary | ICD-10-CM

## 2020-01-09 ENCOUNTER — Ambulatory Visit
Admission: EM | Admit: 2020-01-09 | Discharge: 2020-01-09 | Disposition: A | Discharge status: Home / Self Care | Payer: BC Managed Care – PPO | Attending: Physician Assistant | Admitting: Physician Assistant

## 2020-01-09 ENCOUNTER — Inpatient Hospital Stay: Payer: BC Managed Care – PPO | Attending: Hematology and Oncology

## 2020-01-09 DIAGNOSIS — H9203 Otalgia, bilateral: Secondary | ICD-10-CM | POA: Insufficient documentation

## 2020-01-09 DIAGNOSIS — J029 Acute pharyngitis, unspecified: Secondary | ICD-10-CM | POA: Diagnosis not present

## 2020-01-09 LAB — POCT RAPID STREP A (OFFICE): Rapid Strep A Screen: NEGATIVE

## 2020-01-09 MED ORDER — AZELASTINE HCL 0.1 % NA SOLN: 2.0000 | Freq: Two times a day (BID) | NASAL | 0 refills | Status: AC

## 2020-01-09 NOTE — ED Triage Notes (Signed)
Patient is here today with complaints of bilateral ear pain and sore throat that began about a week ago. Patient has used OTC ear drops with no relief.

## 2020-01-09 NOTE — ED Provider Notes (Signed)
[facility]    CSN: [id number] Arrival date & time: 01/09/20  1329      History   Chief Complaint Chief Complaint  Patient presents with  . Ear Pain  . Sore Throat    HPI Vickie Galvan is a 44 y.o. female.   44 year old female comes in for 1 week history of URI symptoms. Sore throat, bilateral ear pain. Denies rhinorrhea, nasal congestion, cough. Denies fever, chills, body aches. Denies abdominal pain, nausea, vomiting, diarrhea. Denies shortness of breath, loss of taste/smell. otc cold medicine without relief. COVID vaccinated Tomma Rakers, 12/2019)     Past Medical History:  Diagnosis Date  . Anxiety   . Cough 09/17/2015  . Goiter   . Hypertension    states under control with med., has been on med. x 9 yr.  . Muscle diastasis 08/2015   abd.  . Stuffy and runny nose 09/17/2015   clear drainage from nose, per pt.    Patient Active Problem List   Diagnosis Date Noted  . Hypothyroidism 11/06/2019  . Iron deficiency anemia 10/12/2019  . Rhinitis, chronic 06/26/2016  . Obstructive sleep apnea 01/02/2016  . Hyperglycemia 01/02/2016  . Ventral hernia 05/04/2014  . Epigastric hernia 01/30/2014  . Umbilical hernia 44/31/5400  . Preventative health care 05/04/2011  . Rash 05/04/2011  . Environmental allergies 10/27/2010  . Anxiety 10/27/2010  . Cervicalgia 08/20/2010  . GOITER, MULTINODULAR 05/13/2009  . MENORRHAGIA 03/01/2009  . FATIGUE 03/01/2009  . ANEMIA, IRON DEFICIENCY 08/03/2008  . Hypertension 08/03/2008    Past Surgical History:  Procedure Laterality Date  . ABDOMINOPLASTY Bilateral 09/23/2015   Procedure: EXPLORATION OF ABDOMEN REPAIR OF SCAR AND DIASTASIS ;  Surgeon: Cristine Polio, MD;  Location: [facility];  Service: Plastics;  Laterality: Bilateral;  . BREAST BIOPSY Left 08/2018  . BREAST BIOPSY Left 04/2016  . CESAREAN SECTION  01/06/2005; 07/02/2006   X 2  . COLONOSCOPY    . INSERTION OF MESH N/A 05/04/2014    Procedure: INSERTION OF MESH;  Surgeon: Fanny Skates, MD;  Location: [facility];  Service: General;  Laterality: N/A;  . INSERTION OF MESH N/A 05/06/2015   Procedure: INSERTION OF MESH;  Surgeon: Cristine Polio, MD;  Location: [facility];  Service: Plastics;  Laterality: N/A;  umbiical  . RIGHT OOPHORECTOMY  01/06/2005  . TUBAL LIGATION  07/02/2006  . VENTRAL HERNIA REPAIR N/A 05/04/2014   Procedure: LAPAROSCOPIC REPAIR MUTIPLE INCARCERATED VENTRAL HERNIAS;  Surgeon: Fanny Skates, MD;  Location: [facility];  Service: General;  Laterality: N/A;  . VENTRAL HERNIA REPAIR N/A 05/06/2015   Procedure: EXPLORATION OF ABDOMIN, REPAIR RECURRENT VENTRAL HERNIA AND SEVERE DIASTASIS RECTI;  Surgeon: Cristine Polio, MD;  Location: [facility];  Service: Plastics;  Laterality: N/A;    OB History   No obstetric history on file.      Home Medications    Prior to Admission medications   Medication Sig Start Date End Date Taking? Authorizing Provider  albuterol (PROAIR HFA) 108 (90 Base) MCG/ACT inhaler ProAir HFA 90 mcg/actuation aerosol inhaler  INHALE 2 PUFFS BY MOUTH EVERY 4 HOURS AS NEEDED FOR WHEEZE 09/14/17   [provider]  ALPRAZolam Duanne Moron) 0.25 MG tablet TAKE 1 TABLET BY MOUTH TWICE A DAY AS NEEDED ANXIETY 02/14/18   Biagio Borg, MD  amLODipine (NORVASC) 10 MG tablet Take 1 tablet (10 mg total) by mouth daily. 11/01/19   Biagio Borg, MD  azelastine (ASTELIN) 0.1 %  nasal spray Place 2 sprays into both nostrils 2 (two) times daily. 01/09/20   Tasia Catchings, Jovonne Wilton V, PA-C  levonorgestrel (MIRENA, 52 MG,) 20 MCG/24HR IUD 1 Device by Intrauterine route as directed.    [provider]  levothyroxine (SYNTHROID) 50 MCG tablet Take 1 tablet (50 mcg total) by mouth daily before breakfast. 11/06/19   Renato Shin, MD  Potassium Chloride ER 20 MEQ TBCR Take 10 mEq by mouth 2 (two) times daily. 12/14/19   Biagio Borg, MD  telmisartan-hydrochlorothiazide (MICARDIS HCT)  80-12.5 MG tablet TAKE 1 TABLET BY MOUTH EVERY DAY 12/27/19   Biagio Borg, MD    Family History Family History  Problem Relation Age of Onset  . Hypertension Maternal Grandmother   . Hypertension Maternal Grandfather   . Diabetes Maternal Grandfather   . Stroke Maternal Grandfather   . Hypertension Mother     Social History Social History   Tobacco Use  . Smoking status: Never Smoker  . Smokeless tobacco: Never Used  Vaping Use  . Vaping Use: Never used  Substance Use Topics  . Alcohol use: Yes    Comment: occasionally  . Drug use: No     Allergies   Azithromycin and Cephalexin   Review of Systems Review of Systems  Reason unable to perform ROS: See HPI as above.     Physical Exam Triage Vital Signs ED Triage Vitals  Enc Vitals Group     BP 01/09/20 1421 125/86     Pulse Rate 01/09/20 1421 (!) 101     Resp 01/09/20 1421 16     Temp 01/09/20 1421 98.8 F (37.1 C)     Temp Source 01/09/20 1421 Oral     SpO2 01/09/20 1421 98 %     Weight --      Height --      Head Circumference --      Peak Flow --      Pain Score 01/09/20 1425 5     Pain Loc --      Pain Edu? --      Excl. in [place]? --    No data found.  Updated Vital Signs BP 125/86 (BP Location: Left Arm)   Pulse (!) 101   Temp 98.8 F (37.1 C) (Oral)   Resp 16   LMP 12/25/2019 (Approximate)   SpO2 98%   Physical Exam Constitutional:      General: She is not in acute distress.    Appearance: Normal appearance. She is well-developed. She is not ill-appearing, toxic-appearing or diaphoretic.  HENT:     Head: Normocephalic and atraumatic.     Right Ear: Tympanic membrane, ear canal and external ear normal. Tympanic membrane is not erythematous or bulging.     Left Ear: Tympanic membrane, ear canal and external ear normal. Tympanic membrane is not erythematous or bulging.     Nose:     Right Sinus: No maxillary sinus tenderness or frontal sinus tenderness.     Left Sinus: No maxillary sinus  tenderness or frontal sinus tenderness.     Mouth/Throat:     Mouth: Mucous membranes are moist.     Pharynx: Oropharynx is clear. Uvula midline.  Eyes:     Conjunctiva/sclera: Conjunctivae normal.     Pupils: Pupils are equal, round, and reactive to light.  Cardiovascular:     Rate and Rhythm: Normal rate and regular rhythm.  Pulmonary:     Effort: Pulmonary effort is normal. No accessory muscle usage, prolonged  expiration, respiratory distress or retractions.     Breath sounds: No decreased air movement or transmitted upper airway sounds. No decreased breath sounds.     Comments: LCTAB Musculoskeletal:     Cervical back: Normal range of motion and neck supple.  Skin:    General: Skin is warm and dry.  Neurological:     Mental Status: She is alert and oriented to person, place, and time.      UC Treatments / Results  Labs (all labs ordered are listed, but only abnormal results are displayed) Labs Reviewed  CULTURE, GROUP A STREP [facility])  POCT RAPID STREP A (OFFICE)    EKG   Radiology No results found.  Procedures Procedures (including critical care time)  Medications Ordered in UC Medications - No data to display  Initial Impression / Assessment and Plan / UC Course  I have reviewed the triage vital signs and the nursing notes.  Pertinent labs & imaging results that were available during my care of the patient were reviewed by me and considered in my medical decision making (see chart for details).    No signs of bacterial infection.  Flonase and azelastine for possible eustachian tube dysfunction causing symptoms.  Other symptomatic treatment discussed.  Return precautions given.  Final Clinical Impressions(s) / UC Diagnoses   Final diagnoses:  Acute ear pain, bilateral  Sore throat   ED Prescriptions    Medication Sig Dispense Auth. Provider   azelastine (ASTELIN) 0.1 % nasal spray Place 2 sprays into both nostrils 2 (two) times daily. 30 mL Ok Edwards,  PA-C     PDMP not reviewed this encounter.   Ok Edwards, PA-C 01/09/20 1517

## 2020-01-09 NOTE — Discharge Instructions (Signed)
Rapid strep negative. Start flonase 2 sprays daily, can add azelastine if needed. Keep hydrated, your urine should be clear to pale yellow in color. Tylenol/motrin for fever and pain. Monitor for any worsening of symptoms, chest pain, shortness of breath, wheezing, swelling of the throat, go to the emergency department for further evaluation needed.

## 2020-01-11 ENCOUNTER — Inpatient Hospital Stay: Payer: BC Managed Care – PPO | Admitting: Hematology and Oncology

## 2020-01-12 LAB — CULTURE, GROUP A STREP (THRC)

## 2020-02-29 ENCOUNTER — Telehealth: Payer: Self-pay | Admitting: Internal Medicine

## 2020-02-29 ENCOUNTER — Other Ambulatory Visit: Payer: Self-pay

## 2020-02-29 MED ORDER — POTASSIUM CHLORIDE ER 20 MEQ PO TBCR: 10.0000 meq | EXTENDED_RELEASE_TABLET | Freq: Two times a day (BID) | ORAL | 0 refills | Status: DC

## 2020-02-29 NOTE — Telephone Encounter (Signed)
Potassium Chloride ER 20 MEQ TBCR  [id number] [place], [place] - [address] Phone:  [phone]  Fax:  [phone]     Last appt: May 2021 Next appt: Not scheduled

## 2020-03-11 ENCOUNTER — Ambulatory Visit: Payer: BC Managed Care – PPO | Admitting: Family Medicine

## 2020-03-11 NOTE — Progress Notes (Deleted)
    Subjective:    CC: R shoulder/elbow pain  I, Naithen Rivenburg, LAT, ATC, am serving as scribe for Dr. Lynne Leader.  HPI: Pt is a 44 y/o female presenting w/ c/o R shoulder and elbow pain.  She locates her pain to .  Radiating pain: R UE weakness: Aggravating factors: Treatments tried:   Pertinent review of Systems: ***  Relevant historical information: ***   Objective:   There were no vitals filed for this visit. General: Well Developed, well nourished, and in no acute distress.   MSK: ***  Lab and Radiology Results No results found for this or any previous visit (from the past 72 hour(s)). No results found.    Impression and Recommendations:    Assessment and Plan: 44 y.o. female with ***.  PDMP not reviewed this encounter. No orders of the defined types were placed in this encounter.  No orders of the defined types were placed in this encounter.   Discussed warning signs or symptoms. Please see discharge instructions. Patient expresses understanding.   ***

## 2020-03-26 ENCOUNTER — Other Ambulatory Visit: Payer: Self-pay | Admitting: Internal Medicine

## 2020-03-26 NOTE — Telephone Encounter (Signed)
Please refill as per office routine med refill policy (all routine meds refilled for 3 mo or monthly per pt preference up to one year from last visit, then month to month grace period for 3 mo, then further med refills will have to be denied)  

## 2020-05-06 ENCOUNTER — Other Ambulatory Visit: Payer: Self-pay | Admitting: Internal Medicine

## 2020-05-06 NOTE — Telephone Encounter (Signed)
Please refill as per office routine med refill policy (all routine meds refilled for 3 mo or monthly per pt preference up to one year from last visit, then month to month grace period for 3 mo, then further med refills will have to be denied)  

## 2020-05-08 ENCOUNTER — Ambulatory Visit: Payer: BC Managed Care – PPO | Admitting: Endocrinology

## 2020-05-24 DIAGNOSIS — Z20822 Contact with and (suspected) exposure to covid-19: Secondary | ICD-10-CM | POA: Diagnosis not present

## 2020-05-30 ENCOUNTER — Other Ambulatory Visit: Payer: Self-pay | Admitting: Internal Medicine

## 2020-07-12 ENCOUNTER — Telehealth: Payer: Self-pay | Admitting: Internal Medicine

## 2020-07-12 ENCOUNTER — Telehealth: Payer: Self-pay

## 2020-07-12 NOTE — Telephone Encounter (Signed)
Patient tested positive for Covid 19 on 07/12/20. She was wondering if there was anything that she needed to take. Please call the patient back at [phone].

## 2020-07-12 NOTE — Telephone Encounter (Signed)
Patient advised.

## 2020-07-12 NOTE — Telephone Encounter (Signed)
Make sure to take Vit D3 2000 units, Vit C (like otc EmergenC), and zinc (which comes in a MultiViatmin usually) as they say these are good to try to get through the infection. Please consider televisit if you feel you may need prescription meds to determine the correct ones, thanks

## 2020-07-18 ENCOUNTER — Other Ambulatory Visit: Payer: Self-pay | Admitting: Internal Medicine

## 2020-07-18 NOTE — Telephone Encounter (Signed)
Please refill as per office routine med refill policy (all routine meds refilled for 3 mo or monthly per pt preference up to one year from last visit, then month to month grace period for 3 mo, then further med refills will have to be denied)  

## 2020-08-02 NOTE — Progress Notes (Signed)
I, Peterson Lombard, LAT, ATC acting as a scribe for Lynne Leader, MD.  Subjective:    CC: Right elbow and bilateral shoulder pain  HPI: Pt is a 45 y/o female c/o R elbow and bilateral shoulder pain. Pt reports R elbow pain ongoing for 3 months w/ recent worsening. Pt locates R elbow pain to posterior aspect. Pt notes unable to fully extend elbow. 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  Radiates: yes- into hand Numbness/tingling: no Aggravates: writing, sleeping Rx tried: creams, ice, heat  Pt reports bilat shoulder pain ongoing for 6 month w/ worsening over the last 3 months, R>L. Pt locates shoulder pain to anterior shoulder and deep into GH joint.  Radiates: no UE numbness/tingling: no UE weakness: no Aggravates: ADB above 90 Rx tried: cream, ice, heat  Pertinent review of Systems: No fevers or chills.  No weight loss or weight gain.  Relevant historical information: No personal history or family history of rheumatoid arthritis and ulcerative colitis psoriasis psoriatic arthritis or lupus.  History of anemia and hypothyroidism.  Patient reports history of elevated sed rate in the past.   Objective:    Vitals:   08/05/20 1301  BP: 120/90  Pulse: 92  SpO2: 99%   General: Well Developed, well nourished, and in no acute distress.   MSK:   C-spine: Normal. Nontender midline. Normal cervical motion.  Right shoulder normal-appearing Nontender. Normal motion.  Pain with abduction. Positive Hawkins and Neer's test. Positive empty can test. Negative Yergason's and speeds test. Pulses capillary refill and sensation are intact distally.  Right elbow mild effusion.  Normal-appearing otherwise Range of motion normal flexion pronation and supination.  Lacks full extension by 5 degrees. Tender palpation posterior elbow. Intact strength.    Lab and Radiology Results  X-ray images right  shoulder and right elbow obtained today personally independently.  X-rays were performed following right elbow injection.  Right shoulder: No fractures or significant degenerative changes.  Right elbow: Double effusion.  No fractures visible.  No significant degenerative changes. . Await formal radiology review   Diagnostic Limited MSK Ultrasound of: Right shoulder Biceps tendon is intact. Subscapularis tendon is intact.  Supraspinatus is intact. Moderate subacromial bursitis present. Subscapularis tendon is intact. AC joint with effusion. Impression: Subacromial bursitis  Procedure: Real-time Ultrasound Guided Injection of right posterior elbow fossa Device: Philips Affiniti 50G Images permanently stored and available for review in PACS Ultrasound evaluation prior to injection reveals effusion at posterior elbow fossa.  Normal-appearing lateral elbow structures Verbal informed consent obtained.  Discussed risks and benefits of procedure. Warned about infection bleeding damage to structures skin hypopigmentation and fat atrophy among others. Patient expresses understanding and agreement Time-out conducted.   Noted no overlying erythema, induration, or other signs of local infection.   Skin prepped in a sterile fashion.   Local anesthesia: Topical Ethyl chloride.   With sterile technique and under real time ultrasound guidance:  40 mg of Kenalog and 1 mL Marcaine injected into the fusion at posterior elbow fossa care was taken to avoid triceps tendon insertion. Fluid seen entering the fossa.   Completed without difficulty   Pain moderately resolved suggesting accurate placement of the medication.   Advised to call if fevers/chills, erythema, induration, drainage, or persistent bleeding.   Images permanently stored and available for review in the ultrasound unit.  Impression: Technically successful ultrasound guided injection.     Impression  and Recommendations:    Assessment and  Plan: 45 y.o. female with   Right elbow pain.  Patient has a large elbow effusion and pain.  This has been ongoing since she worked at the post office worse recently.  This is also associated bilateral shoulder pain.  I am concerned that she may have a rheumatologic process and will proceed with a limited rheumatologic work-up listed below..  She is in a fair amount of pain so we will proceed with steroid injection as above. Recheck in 1 month or so.  Additionally will refer to physical therapy.  Right shoulder pain: Thought to be subacromial bursitis.  Physical therapy should be very helpful.  Recheck in 1 month.  PDMP not reviewed this encounter. Orders Placed This Encounter  Procedures  . [place] LIMITED JOINT SPACE STRUCTURES UP RIGHT(NO LINKED CHARGES)    Standing Status:   Future    Number of Occurrences:   1    Standing Expiration Date:   02/02/2021    Order Specific Question:   Reason for Exam (SYMPTOM  OR DIAGNOSIS REQUIRED)    Answer:   chronic right shoulder pain    Order Specific Question:   Preferred imaging location?    Answer:   [facility]  . DG Shoulder Right    Standing Status:   Future    Number of Occurrences:   1    Standing Expiration Date:   08/05/2021    Order Specific Question:   Reason for Exam (SYMPTOM  OR DIAGNOSIS REQUIRED)    Answer:   eval shoulder pain    Order Specific Question:   Is patient pregnant?    Answer:   No    Order Specific Question:   Preferred imaging location?    Answer:   [facility]  . DG ELBOW COMPLETE RIGHT (3+VIEW)    Standing Status:   Future    Number of Occurrences:   1    Standing Expiration Date:   08/05/2021    Order Specific Question:   Reason for Exam (SYMPTOM  OR DIAGNOSIS REQUIRED)    Answer:   eval elbow    Order Specific Question:   Is patient pregnant?    Answer:   No    Order Specific Question:   Preferred imaging location?    Answer:   [facility]  . TSH    Standing Status:    Future    Number of Occurrences:   1    Standing Expiration Date:   08/05/2021  . CBC    Standing Status:   Future    Number of Occurrences:   1    Standing Expiration Date:   08/05/2021  . Sedimentation rate    Standing Status:   Future    Number of Occurrences:   1    Standing Expiration Date:   08/05/2021  . Rheumatoid factor    Standing Status:   Future    Number of Occurrences:   1    Standing Expiration Date:   08/05/2021  . ANA    Standing Status:   Future    Number of Occurrences:   1    Standing Expiration Date:   08/05/2021  . Uric acid    Standing Status:   Future    Number of Occurrences:   1    Standing Expiration Date:   08/05/2021  . Ambulatory referral to Physical Therapy    Referral Priority:   Routine  Referral Type:   Physical Medicine    Referral Reason:   Specialty Services Required    Requested Specialty:   Physical Therapy   No orders of the defined types were placed in this encounter.   Discussed warning signs or symptoms. Please see discharge instructions. Patient expresses understanding.   The above documentation has been reviewed and is accurate and complete Lynne Leader, M.D.

## 2020-08-05 ENCOUNTER — Ambulatory Visit: Payer: Self-pay

## 2020-08-05 ENCOUNTER — Ambulatory Visit (INDEPENDENT_AMBULATORY_CARE_PROVIDER_SITE_OTHER): Payer: BC Managed Care – PPO

## 2020-08-05 ENCOUNTER — Other Ambulatory Visit: Payer: Self-pay

## 2020-08-05 ENCOUNTER — Ambulatory Visit: Payer: BC Managed Care – PPO | Admitting: Family Medicine

## 2020-08-05 VITALS — BP 120/90 | HR 92 | Ht 66.0 in | Wt 148.0 lb

## 2020-08-05 DIAGNOSIS — E890 Postprocedural hypothyroidism: Secondary | ICD-10-CM

## 2020-08-05 DIAGNOSIS — M25421 Effusion, right elbow: Secondary | ICD-10-CM | POA: Diagnosis not present

## 2020-08-05 DIAGNOSIS — G8929 Other chronic pain: Secondary | ICD-10-CM

## 2020-08-05 DIAGNOSIS — M25521 Pain in right elbow: Secondary | ICD-10-CM

## 2020-08-05 DIAGNOSIS — D509 Iron deficiency anemia, unspecified: Secondary | ICD-10-CM

## 2020-08-05 DIAGNOSIS — M25511 Pain in right shoulder: Secondary | ICD-10-CM | POA: Diagnosis not present

## 2020-08-05 LAB — CBC
HCT: 35.4 % — ABNORMAL LOW (ref 36.0–46.0)
Hemoglobin: 12.2 g/dL (ref 12.0–15.0)
MCHC: 34.5 g/dL (ref 30.0–36.0)
MCV: 110.7 fl — ABNORMAL HIGH (ref 78.0–100.0)
Platelets: 251 10*3/uL (ref 150.0–400.0)
RBC: 3.17 Mil/uL — ABNORMAL LOW (ref 3.87–5.11)
RDW: 14 % (ref 11.5–15.5)
WBC: 7.3 10*3/uL (ref 4.0–10.5)

## 2020-08-05 LAB — TSH: TSH: 9.58 u[IU]/mL — ABNORMAL HIGH (ref 0.35–4.50)

## 2020-08-05 LAB — URIC ACID: Uric Acid, Serum: 8.2 mg/dL — ABNORMAL HIGH (ref 2.4–7.0)

## 2020-08-05 LAB — SEDIMENTATION RATE: Sed Rate: 42 mm/hr — ABNORMAL HIGH (ref 0–20)

## 2020-08-05 NOTE — Patient Instructions (Signed)
Thank you for coming in today.  Please get an Xray today before you leave  Please get labs today before you leave  Recheck with me in 1 month.    Plan for PT.   I've referred you to Physical Therapy.  Let us know if you don't hear from them in one week.

## 2020-08-06 LAB — ANA: Anti Nuclear Antibody (ANA): NEGATIVE

## 2020-08-06 LAB — RHEUMATOID FACTOR: Rheumatoid fact SerPl-aCnc: 19 IU/mL — ABNORMAL HIGH (ref <14)

## 2020-08-06 NOTE — Progress Notes (Signed)
Right shoulder x-ray is normal.

## 2020-08-06 NOTE — Progress Notes (Signed)
Swelling present in the elbow joint

## 2020-08-07 ENCOUNTER — Telehealth: Payer: Self-pay

## 2020-08-07 ENCOUNTER — Telehealth: Payer: Self-pay | Admitting: Internal Medicine

## 2020-08-07 MED ORDER — LEVOTHYROXINE SODIUM 88 MCG PO TABS
88.0000 ug | ORAL_TABLET | Freq: Every day | ORAL | 3 refills | Status: AC
Start: 2020-08-07 — End: ?

## 2020-08-07 MED ORDER — COLCHICINE 0.6 MG PO TABS: 0.6000 mg | ORAL_TABLET | Freq: Every day | ORAL | 2 refills | Status: DC | PRN

## 2020-08-07 MED ORDER — ALLOPURINOL 300 MG PO TABS: 300.0000 mg | ORAL_TABLET | Freq: Every day | ORAL | 1 refills | Status: AC

## 2020-08-07 NOTE — Telephone Encounter (Signed)
Patient notified      TSH is mildly high  Ok to increase the thyroid medication from 50 mcg per day , to 88 mcg per day  I will send the rx and we can recheck next visit

## 2020-08-07 NOTE — Progress Notes (Signed)
ANA test for lupus came back negative. Rheumatoid factor is minimally elevated.  This is probably not significant. Sedimentation rate is minimally elevated indicating generalized inflammation.  This is not much changed from 10 years ago. Complete blood count is minimally abnormal probably recovering from significant anemia 10 months ago. TSH is abnormal indicating your thyroid probably needs to be adjusted.  Uric acid is significantly elevated at 8.2 indicating the gout is probably a factor here it may be causing your elbow pain.  Gout is probably the reason you are hurting.  I will prescribe 2 medicines.  Prescribed colchicine to take daily to reduce gout flares and I will prescribe allopurinol to take daily to lower uric acid.

## 2020-08-07 NOTE — Addendum Note (Signed)
Addended by: Gregor Hams on: 08/07/2020 07:20 AM   Modules accepted: Orders

## 2020-08-07 NOTE — Telephone Encounter (Signed)
Ok to contact pt  TSH is mildly high  Ok to increase the thyroid medication from 50 mcg per day , to 88 mcg per day  I will send the rx and we can recheck next visit

## 2020-08-30 NOTE — Progress Notes (Deleted)
   I, Peterson Lombard, LAT, ATC acting as a scribe for Lynne Leader, MD.  Vickie Galvan is a 45 y.o. female who presents to [facility] today for f/u of B shoulder pain and R elbow pain.   Of note, pt works at the post office. Pt was last seen by Dr. Georgina Snell on 08/05/20 and obtained rheumatologic labs and was given a steroid injection to address R elbow pain. Pt was referred to PT at the [facility] location to address both pathologies and has not had any visits to date. Today, pt reports   Dx testing: 08/05/20 Uric acid 8.2, ANA neg, rheumatoid factor 19, sed rate 42, CBC, TSH 9.58  08/05/20 R shoulder XR & R elbow XR  Pertinent review of systems: ***  Relevant historical information: ***   Exam:  There were no vitals taken for this visit. General: Well Developed, well nourished, and in no acute distress.   MSK: ***    Lab and Radiology Results No results found for this or any previous visit (from the past 72 hour(s)). No results found.     Assessment and Plan: 45 y.o. female with ***   PDMP not reviewed this encounter. No orders of the defined types were placed in this encounter.  No orders of the defined types were placed in this encounter.    Discussed warning signs or symptoms. Please see discharge instructions. Patient expresses understanding.   ***

## 2020-08-31 ENCOUNTER — Ambulatory Visit: Payer: BC Managed Care – PPO

## 2020-08-31 ENCOUNTER — Other Ambulatory Visit: Payer: Self-pay | Admitting: Internal Medicine

## 2020-09-01 NOTE — Telephone Encounter (Signed)
Please refill as per office routine med refill policy (all routine meds refilled for 3 mo or monthly per pt preference up to one year from last visit, then month to month grace period for 3 mo, then further med refills will have to be denied)  

## 2020-09-02 ENCOUNTER — Ambulatory Visit: Payer: BC Managed Care – PPO | Admitting: Family Medicine

## 2020-10-02 ENCOUNTER — Other Ambulatory Visit: Payer: Self-pay | Admitting: Obstetrics & Gynecology

## 2020-10-02 DIAGNOSIS — R921 Mammographic calcification found on diagnostic imaging of breast: Secondary | ICD-10-CM

## 2020-10-29 ENCOUNTER — Ambulatory Visit
Admission: RE | Admit: 2020-10-29 | Discharge: 2020-10-29 | Disposition: A | Discharge status: Home / Self Care | Payer: No Typology Code available for payment source | Source: Ambulatory Visit | Attending: Obstetrics & Gynecology | Admitting: Obstetrics & Gynecology

## 2020-10-29 ENCOUNTER — Other Ambulatory Visit: Payer: Self-pay

## 2020-10-29 DIAGNOSIS — R921 Mammographic calcification found on diagnostic imaging of breast: Secondary | ICD-10-CM

## 2020-10-29 LAB — HM MAMMOGRAPHY

## 2020-11-02 ENCOUNTER — Other Ambulatory Visit: Payer: Self-pay | Admitting: Internal Medicine

## 2020-11-02 ENCOUNTER — Other Ambulatory Visit: Payer: Self-pay | Admitting: Family Medicine

## 2020-11-02 NOTE — Telephone Encounter (Signed)
Please refill as per office routine med refill policy (all routine meds refilled for 3 mo or monthly per pt preference up to one year from last visit, then month to month grace period for 3 mo, then further med refills will have to be denied)  

## 2020-11-05 ENCOUNTER — Encounter: Payer: Self-pay | Admitting: Internal Medicine

## 2020-12-12 ENCOUNTER — Other Ambulatory Visit: Payer: Self-pay | Admitting: Internal Medicine

## 2020-12-12 NOTE — Telephone Encounter (Signed)
Please refill as per office routine med refill policy (all routine meds refilled for 3 mo or monthly per pt preference up to one year from last visit, then month to month grace period for 3 mo, then further med refills will have to be denied)  

## 2020-12-13 ENCOUNTER — Telehealth: Payer: Self-pay | Admitting: Internal Medicine

## 2020-12-13 MED ORDER — AMLODIPINE BESYLATE 10 MG PO TABS
10.0000 mg | ORAL_TABLET | Freq: Every day | ORAL | 0 refills | Status: DC
Start: 2020-12-13 — End: 2021-01-09

## 2020-12-13 NOTE — Telephone Encounter (Signed)
Amlodipine done 1 mo  Please to contact pt - due for ROV for further refills

## 2020-12-13 NOTE — Telephone Encounter (Signed)
1.Medication Requested: amLODipine (NORVASC) 10 MG tablet   2. Pharmacy (Name, [address], [place]): [id number] - [place] [place]   3. On Med List: yes   4. Last Visit with PCP: 11-01-19  5. Next visit date with PCP: patient said that she started a new job and is unable to schedule an appointment at this time    Agent: Please be advised that RX refills may take up to 3 business days. We ask that you follow-up with your pharmacy.

## 2020-12-19 NOTE — Telephone Encounter (Signed)
   Patient is also requesting a short supply of Potassium Chloride ER 20 MEQ TBCR. She is scheduled to see PCP on 01-07-21. Please advise  [id number] [place], [place] - [address]

## 2020-12-20 MED ORDER — POTASSIUM CHLORIDE ER 20 MEQ PO TBCR: 1.0000 | EXTENDED_RELEASE_TABLET | Freq: Two times a day (BID) | ORAL | 0 refills | Status: AC

## 2020-12-20 NOTE — Addendum Note (Signed)
Addended by: Earnstine Regal on: 12/20/2020 03:22 PM   Modules accepted: Orders

## 2021-01-07 ENCOUNTER — Ambulatory Visit: Payer: No Typology Code available for payment source | Admitting: Internal Medicine

## 2021-01-09 ENCOUNTER — Other Ambulatory Visit: Payer: Self-pay

## 2021-01-09 ENCOUNTER — Other Ambulatory Visit: Payer: Self-pay | Admitting: Internal Medicine

## 2021-01-09 MED ORDER — AMLODIPINE BESYLATE 10 MG PO TABS: 10.0000 mg | ORAL_TABLET | Freq: Every day | ORAL | 2 refills | Status: AC

## 2021-01-09 NOTE — Telephone Encounter (Signed)
Please refill as per office routine med refill policy (all routine meds refilled for 3 mo or monthly per pt preference up to one year from last visit, then month to month grace period for 3 mo, then further med refills will have to be denied)  

## 2021-01-19 ENCOUNTER — Other Ambulatory Visit: Payer: Self-pay | Admitting: Internal Medicine

## 2021-03-11 DIAGNOSIS — Z124 Encounter for screening for malignant neoplasm of cervix: Secondary | ICD-10-CM | POA: Diagnosis not present

## 2021-03-11 DIAGNOSIS — Z01419 Encounter for gynecological examination (general) (routine) without abnormal findings: Secondary | ICD-10-CM | POA: Diagnosis not present

## 2021-03-11 DIAGNOSIS — Z6823 Body mass index (BMI) 23.0-23.9, adult: Secondary | ICD-10-CM | POA: Diagnosis not present

## 2021-03-13 ENCOUNTER — Encounter: Payer: Self-pay | Admitting: Internal Medicine

## 2021-03-18 DIAGNOSIS — Z1152 Encounter for screening for COVID-19: Secondary | ICD-10-CM | POA: Diagnosis not present

## 2021-04-08 ENCOUNTER — Telehealth: Payer: Self-pay | Admitting: Internal Medicine

## 2021-04-08 ENCOUNTER — Other Ambulatory Visit: Payer: Self-pay

## 2021-04-08 ENCOUNTER — Encounter: Payer: Self-pay | Admitting: Emergency Medicine

## 2021-04-08 ENCOUNTER — Ambulatory Visit
Admission: EM | Admit: 2021-04-08 | Discharge: 2021-04-08 | Disposition: A | Discharge status: Home / Self Care | Payer: BC Managed Care – PPO | Attending: Internal Medicine | Admitting: Internal Medicine

## 2021-04-08 ENCOUNTER — Encounter: Payer: Self-pay | Admitting: Hematology and Oncology

## 2021-04-08 DIAGNOSIS — J069 Acute upper respiratory infection, unspecified: Secondary | ICD-10-CM

## 2021-04-08 DIAGNOSIS — H65191 Other acute nonsuppurative otitis media, right ear: Secondary | ICD-10-CM

## 2021-04-08 DIAGNOSIS — J029 Acute pharyngitis, unspecified: Secondary | ICD-10-CM

## 2021-04-08 DIAGNOSIS — Z20822 Contact with and (suspected) exposure to covid-19: Secondary | ICD-10-CM | POA: Diagnosis not present

## 2021-04-08 DIAGNOSIS — R112 Nausea with vomiting, unspecified: Secondary | ICD-10-CM | POA: Insufficient documentation

## 2021-04-08 LAB — POCT RAPID STREP A (OFFICE): Rapid Strep A Screen: NEGATIVE

## 2021-04-08 MED ORDER — TELMISARTAN-HCTZ 80-12.5 MG PO TABS: 1.0000 | ORAL_TABLET | Freq: Every day | ORAL | 0 refills | Status: AC

## 2021-04-08 MED ORDER — ONDANSETRON 4 MG PO TBDP: 4.0000 mg | ORAL_TABLET | Freq: Three times a day (TID) | ORAL | 0 refills | Status: AC | PRN

## 2021-04-08 MED ORDER — AMOXICILLIN 875 MG PO TABS
875.0000 mg | ORAL_TABLET | Freq: Two times a day (BID) | ORAL | 0 refills | Status: AC
Start: 2021-04-08 — End: 2021-04-15

## 2021-04-08 NOTE — Telephone Encounter (Signed)
1.Medication Requested: telmisartan-hydrochlorothiazide (MICARDIS HCT) 80-12.5 MG tablet  2. Pharmacy (Name, [place], [place]): [id number] - [place], [place]  Phone:  [phone] Fax:  [phone]    3. On Med List: yes  4. Last Visit with PCP: 05.12.21  5. Next visit date with PCP: 11.30.22  Patient is requesting short fill until visit.Marland Kitchensays she will run out of medication before visit   Agent: Please be advised that RX refills may take up to 3 business days. We ask that you follow-up with your pharmacy.

## 2021-04-08 NOTE — ED Triage Notes (Addendum)
Patient has been taking care of her son who has been sick. Symptoms began yesterday. Nausea, emesis, headache. Reports fever yesterday, none today. Emotional in triage because she just found out this morning her friend passed away.  Reported at end of triage that throat was sore, and asked if she could have ears checked and checked for strep because she's presented this way in the past with strep.

## 2021-04-08 NOTE — ED Provider Notes (Addendum)
[facility]    CSN: [id number] Arrival date & time: 04/08/21  1101      History   Chief Complaint Chief Complaint  Patient presents with   Nausea   Emesis   Headache    HPI Vickie Galvan is a 45 y.o. female.   Patient presents with right ear pain, nausea, vomiting, sore throat, nasal congestion, mild cough that has been present since yesterday.  Cough is nonproductive per patient.  Denies any diarrhea or abdominal pain.  Denies any fever.  Son has similar symptoms.  Denies chest pain or shortness of breath.  Patient has taken ibuprofen for symptoms.  Patient also has elevated blood pressure reading today.  Patient reports that she has been under a lot of stress lately with her son as well as a death in the family.  She has been taking her blood pressure medication as prescribed.  Denies chest pain, shortness of breath, headache, blurred vision or dizziness.   Emesis Headache  Past Medical History:  Diagnosis Date   Anxiety    Cough 09/17/2015   Goiter    Hypertension    states under control with med., has been on med. x 9 yr.   Muscle diastasis 08/2015   abd.   Stuffy and runny nose 09/17/2015   clear drainage from nose, per pt.    Patient Active Problem List   Diagnosis Date Noted   Hypothyroidism 11/06/2019   Iron deficiency anemia 10/12/2019   Rhinitis, chronic 06/26/2016   Obstructive sleep apnea 01/02/2016   Hyperglycemia 01/02/2016   Ventral hernia 05/04/2014   Epigastric hernia 16/12/3708   Umbilical hernia 62/69/4854   Preventative health care 05/04/2011   Rash 05/04/2011   Environmental allergies 10/27/2010   Anxiety 10/27/2010   Cervicalgia 08/20/2010   GOITER, MULTINODULAR 05/13/2009   MENORRHAGIA 03/01/2009   FATIGUE 03/01/2009   ANEMIA, IRON DEFICIENCY 08/03/2008   Hypertension 08/03/2008    Past Surgical History:  Procedure Laterality Date   ABDOMINOPLASTY Bilateral 09/23/2015   Procedure: EXPLORATION OF ABDOMEN REPAIR  OF SCAR AND DIASTASIS ;  Surgeon: Cristine Polio, MD;  Location: [place];  Service: Plastics;  Laterality: Bilateral;   BREAST BIOPSY Left 08/2018   BREAST BIOPSY Left 04/2016   CESAREAN SECTION  01/06/2005; 07/02/2006   X 2   COLONOSCOPY     INSERTION OF MESH N/A 05/04/2014   Procedure: INSERTION OF MESH;  Surgeon: Fanny Skates, MD;  Location: [place];  Service: General;  Laterality: N/A;   INSERTION OF MESH N/A 05/06/2015   Procedure: INSERTION OF MESH;  Surgeon: Cristine Polio, MD;  Location: [place];  Service: Plastics;  Laterality: N/A;  umbiical   RIGHT OOPHORECTOMY  01/06/2005   TUBAL LIGATION  07/02/2006   VENTRAL HERNIA REPAIR N/A 05/04/2014   Procedure: LAPAROSCOPIC REPAIR MUTIPLE INCARCERATED VENTRAL HERNIAS;  Surgeon: Fanny Skates, MD;  Location: [place];  Service: General;  Laterality: N/A;   VENTRAL HERNIA REPAIR N/A 05/06/2015   Procedure: EXPLORATION OF ABDOMIN, REPAIR RECURRENT VENTRAL HERNIA AND SEVERE DIASTASIS RECTI;  Surgeon: Cristine Polio, MD;  Location: [place];  Service: Plastics;  Laterality: N/A;    OB History   No obstetric history on file.      Home Medications    Prior to Admission medications   Medication Sig Start Date End Date Taking? Authorizing Provider  amoxicillin (AMOXIL) 875 MG tablet Take 1 tablet (875 mg total) by mouth 2 (two) times daily for 7  days. 04/08/21 04/15/21 Yes Odis Luster, FNP  ondansetron (ZOFRAN-ODT) 4 MG disintegrating tablet Take 1 tablet (4 mg total) by mouth every 8 (eight) hours as needed for nausea or vomiting. 04/08/21  Yes Odis Luster, FNP  albuterol (PROAIR HFA) 108 (90 Base) MCG/ACT inhaler ProAir HFA 90 mcg/actuation aerosol inhaler  INHALE 2 PUFFS BY MOUTH EVERY 4 HOURS AS NEEDED FOR WHEEZE 09/14/17   [provider]  allopurinol (ZYLOPRIM) 300 MG tablet Take 1 tablet (300 mg total) by mouth daily. 08/07/20   Gregor Hams, MD  ALPRAZolam Duanne Moron)  0.25 MG tablet TAKE 1 TABLET BY MOUTH TWICE A DAY AS NEEDED ANXIETY 02/14/18   Biagio Borg, MD  amLODipine (NORVASC) 10 MG tablet Take 1 tablet (10 mg total) by mouth daily. 01/09/21   Biagio Borg, MD  azelastine (ASTELIN) 0.1 % nasal spray Place 2 sprays into both nostrils 2 (two) times daily. 01/09/20   Tasia Catchings, Amy V, PA-C  colchicine 0.6 MG tablet TAKE 1 TABLET (0.6 MG TOTAL) BY MOUTH DAILY AS NEEDED (GOUT OR PSUEDOGOUT PAIN). 11/04/20   Gregor Hams, MD  levonorgestrel (MIRENA, 52 MG,) 20 MCG/24HR IUD 1 Device by Intrauterine route as directed.    [provider]  levothyroxine (SYNTHROID) 88 MCG tablet Take 1 tablet (88 mcg total) by mouth daily. 08/07/20   Biagio Borg, MD  Potassium Chloride ER 20 MEQ TBCR Take 1 tablet by mouth 2 (two) times daily. Must keep scheduled appt for future refills 12/20/20   Biagio Borg, MD  telmisartan-hydrochlorothiazide (MICARDIS HCT) 80-12.5 MG tablet TAKE 1 TABLET BY MOUTH EVERY DAY 09/02/20   Biagio Borg, MD    Family History Family History  Problem Relation Age of Onset   Hypertension Maternal Grandmother    Hypertension Maternal Grandfather    Diabetes Maternal Grandfather    Stroke Maternal Grandfather    Hypertension Mother     Social History Social History   Tobacco Use   Smoking status: Never   Smokeless tobacco: Never  Vaping Use   Vaping Use: Never used  Substance Use Topics   Alcohol use: Yes    Comment: occasionally   Drug use: No     Allergies   Azithromycin and Cephalexin   Review of Systems Review of Systems Per HPI  Physical Exam Triage Vital Signs ED Triage Vitals [04/08/21 1137]  Enc Vitals Group     BP (!) 163/113     Pulse Rate (!) 103     Resp 16     Temp 98.9 F (37.2 C)     Temp Source Oral     SpO2 98 %     Weight      Height      Head Circumference      Peak Flow      Pain Score 1     Pain Loc      Pain Edu?      Excl. in [place]?    No data found.  Updated Vital Signs BP (!) 163/113  (BP Location: Left Arm)   Pulse (!) 103   Temp 98.9 F (37.2 C) (Oral)   Resp 16   SpO2 98%   Visual Acuity Right Eye Distance:   Left Eye Distance:   Bilateral Distance:    Right Eye Near:   Left Eye Near:    Bilateral Near:     Physical Exam Constitutional:      General: She is not in  acute distress.    Appearance: Normal appearance.  HENT:     Head: Normocephalic and atraumatic.     Right Ear: Ear canal normal. Tympanic membrane is erythematous. Tympanic membrane is not perforated or bulging.     Left Ear: Tympanic membrane and ear canal normal.     Nose: Congestion present.     Mouth/Throat:     Mouth: Mucous membranes are moist.     Pharynx: Posterior oropharyngeal erythema present.  Eyes:     Extraocular Movements: Extraocular movements intact.     Conjunctiva/sclera: Conjunctivae normal.     Pupils: Pupils are equal, round, and reactive to light.  Cardiovascular:     Rate and Rhythm: Normal rate and regular rhythm.     Pulses: Normal pulses.     Heart sounds: Normal heart sounds.  Pulmonary:     Effort: Pulmonary effort is normal. No respiratory distress.     Breath sounds: Normal breath sounds. No wheezing.  Abdominal:     General: Abdomen is flat. Bowel sounds are normal.     Palpations: Abdomen is soft.  Musculoskeletal:        General: Normal range of motion.     Cervical back: Normal range of motion.  Skin:    General: Skin is warm and dry.  Neurological:     General: No focal deficit present.     Mental Status: She is alert and oriented to person, place, and time. Mental status is at baseline.  Psychiatric:        Mood and Affect: Mood normal.        Behavior: Behavior normal.     UC Treatments / Results  Labs (all labs ordered are listed, but only abnormal results are displayed) Labs Reviewed  CULTURE, GROUP A STREP ([place])  NOVEL CORONAVIRUS, NAA  POCT RAPID STREP A (OFFICE)    EKG   Radiology No results  found.  Procedures Procedures (including critical care time)  Medications Ordered in UC Medications - No data to display  Initial Impression / Assessment and Plan / UC Course  I have reviewed the triage vital signs and the nursing notes.  Pertinent labs & imaging results that were available during my care of the patient were reviewed by me and considered in my medical decision making (see chart for details).     Patient presents with symptoms likely from a viral upper respiratory infection. Differential includes bacterial pneumonia, sinusitis, allergic rhinitis, Covid 19. Do not suspect underlying cardiopulmonary process. Symptoms seem unlikely related to ACS, CHF or COPD exacerbations, pneumonia, pneumothorax. Patient is nontoxic appearing and not in need of emergent medical intervention.  Rapid strep test was negative.  Throat culture and COVID-19 viral swab are pending.  Will treat right otitis media with amoxicillin antibiotic.  Ondansetron prescribed to take as needed for nausea.  Patient to increase clear oral fluid intake.  Recommended symptom control with over the counter medications that are safe with HTN including mucinex and coricidin HBP.  Patient to follow-up with PCP for high blood pressure.  Suspect stress related hypertension.  No signs of hypertensive urgency at this time.  Return if symptoms fail to improve in 1-2 weeks or you develop shortness of breath, chest pain, severe headache. Patient states understanding and is agreeable.  Discharged with PCP followup.  Final Clinical Impressions(s) / UC Diagnoses   Final diagnoses:  Viral upper respiratory tract infection with cough  Sore throat  Encounter for laboratory testing for COVID-19 virus  Nausea  and vomiting, unspecified vomiting type  Other non-recurrent acute nonsuppurative otitis media of right ear     Discharge Instructions      You are being treated for right ear infection with amoxicillin antibiotic.   You have also been prescribed ondansetron to take as needed for nausea.  Rapid Strep test was negative.  Throat culture and COVID-19 viral swab are pending.  We will call if they are positive.     ED Prescriptions     Medication Sig Dispense Auth. Provider   amoxicillin (AMOXIL) 875 MG tablet Take 1 tablet (875 mg total) by mouth 2 (two) times daily for 7 days. 14 tablet Phillips Odor E, FNP   ondansetron (ZOFRAN-ODT) 4 MG disintegrating tablet Take 1 tablet (4 mg total) by mouth every 8 (eight) hours as needed for nausea or vomiting. 20 tablet Odis Luster, FNP      PDMP not reviewed this encounter.   Odis Luster, FNP 04/08/21 1212    Odis Luster, FNP 04/08/21 1213

## 2021-04-08 NOTE — Discharge Instructions (Signed)
You are being treated for right ear infection with amoxicillin antibiotic.  You have also been prescribed ondansetron to take as needed for nausea.  Rapid Strep test was negative.  Throat culture and COVID-19 viral swab are pending.  We will call if they are positive.

## 2021-04-09 LAB — SARS-COV-2, NAA 2 DAY TAT

## 2021-04-09 LAB — NOVEL CORONAVIRUS, NAA: SARS-CoV-2, NAA: NOT DETECTED

## 2021-04-12 LAB — CULTURE, GROUP A STREP (THRC)

## 2021-04-13 NOTE — Progress Notes (Signed)
Virtual Visit via Telephone Note  I connected with Vickie Galvan, on 04/18/2021 at 3:44 PM by telephone due to the COVID-19 pandemic and verified that I am speaking with the correct person using two identifiers.  Due to current restrictions/limitations of in-office visits due to the COVID-19 pandemic, this scheduled clinical appointment was converted to a telehealth visit.   Consent: I discussed the limitations, risks, security and privacy concerns of performing an evaluation and management service by telephone and the availability of in person appointments. I also discussed with the patient that there may be a patient responsible charge related to this service. The patient expressed understanding and agreed to proceed.   Location of Patient: Home  Location of Provider: [facility] [place] participating in Telemedicine visit: Aslan [name] Joella Prince, NP Carilyn Goodpasture, RN  History of Present Illness: Vickie Galvan is a 45 year-old female who presents to establish care.   Current issues and/or concerns: Planning to call back to schedule in-person appointment soon. Currently her job is understaffed so unsure of when she will be able to come in to office.  Past Medical History:  Diagnosis Date   Anxiety    Cough 09/17/2015   Goiter    Hypertension    states under control with med., has been on med. x 9 yr.   Muscle diastasis 08/2015   abd.   Stuffy and runny nose 09/17/2015   clear drainage from nose, per pt.   Allergies  Allergen Reactions   Azithromycin Other (See Comments)    ABD. PAIN   Cephalexin     Other reaction(s): Abdominal Pain    Current Outpatient Medications on File Prior to Visit  Medication Sig Dispense Refill   albuterol (PROAIR HFA) 108 (90 Base) MCG/ACT inhaler ProAir HFA 90 mcg/actuation aerosol inhaler  INHALE 2 PUFFS BY MOUTH EVERY 4 HOURS AS NEEDED FOR WHEEZE     allopurinol (ZYLOPRIM) 300 MG  tablet Take 1 tablet (300 mg total) by mouth daily. 90 tablet 1   ALPRAZolam (XANAX) 0.25 MG tablet TAKE 1 TABLET BY MOUTH TWICE A DAY AS NEEDED ANXIETY 60 tablet 0   amLODipine (NORVASC) 10 MG tablet Take 1 tablet (10 mg total) by mouth daily. 90 tablet 2   azelastine (ASTELIN) 0.1 % nasal spray Place 2 sprays into both nostrils 2 (two) times daily. 30 mL 0   colchicine 0.6 MG tablet TAKE 1 TABLET (0.6 MG TOTAL) BY MOUTH DAILY AS NEEDED (GOUT OR PSUEDOGOUT PAIN). 90 tablet 1   levonorgestrel (MIRENA, 52 MG,) 20 MCG/24HR IUD 1 Device by Intrauterine route as directed.     levothyroxine (SYNTHROID) 88 MCG tablet Take 1 tablet (88 mcg total) by mouth daily. 90 tablet 3   ondansetron (ZOFRAN-ODT) 4 MG disintegrating tablet Take 1 tablet (4 mg total) by mouth every 8 (eight) hours as needed for nausea or vomiting. 20 tablet 0   Potassium Chloride ER 20 MEQ TBCR Take 1 tablet by mouth 2 (two) times daily. Must keep scheduled appt for future refills 60 tablet 0   telmisartan-hydrochlorothiazide (MICARDIS HCT) 80-12.5 MG tablet Take 1 tablet by mouth daily. 30 tablet 0   No current facility-administered medications on file prior to visit.    Observations/Objective: Alert and oriented x 3. Not in acute distress. Physical examination not completed as this is a telemedicine visit.  Assessment and Plan: 1. Encounter to establish care: - Patient presents today to establish care.  -  Return for annual physical examination, labs, and health maintenance. Arrive fasting meaning having no food for at least 8 hours prior to appointment. You may have only water or black coffee. Please take scheduled medications as normal.   Follow Up Instructions: Return for annual physical exam.   Patient was given clear instructions to go to Emergency Department or return to medical center if symptoms don't improve, worsen, or new problems develop.The patient verbalized understanding.  I discussed the assessment and  treatment plan with the patient. The patient was provided an opportunity to ask questions and all were answered. The patient agreed with the plan and demonstrated an understanding of the instructions.   The patient was advised to call back or seek an in-person evaluation if the symptoms worsen or if the condition fails to improve as anticipated.     I provided 5 minutes total of non-face-to-face time during this encounter.   Camillia Herter, NP  [facility] [place], [place] 04/18/2021, 3:44 PM

## 2021-04-18 ENCOUNTER — Ambulatory Visit (INDEPENDENT_AMBULATORY_CARE_PROVIDER_SITE_OTHER): Payer: BC Managed Care – PPO | Admitting: Family

## 2021-04-18 ENCOUNTER — Other Ambulatory Visit: Payer: Self-pay

## 2021-04-18 DIAGNOSIS — Z7689 Persons encountering health services in other specified circumstances: Secondary | ICD-10-CM

## 2021-04-22 ENCOUNTER — Encounter: Payer: Self-pay | Admitting: Emergency Medicine

## 2021-04-22 ENCOUNTER — Ambulatory Visit
Admission: EM | Admit: 2021-04-22 | Discharge: 2021-04-22 | Disposition: A | Discharge status: Home / Self Care | Payer: BC Managed Care – PPO | Attending: Internal Medicine | Admitting: Internal Medicine

## 2021-04-22 ENCOUNTER — Other Ambulatory Visit: Payer: Self-pay

## 2021-04-22 DIAGNOSIS — R6889 Other general symptoms and signs: Secondary | ICD-10-CM | POA: Diagnosis not present

## 2021-04-22 DIAGNOSIS — Z20822 Contact with and (suspected) exposure to covid-19: Secondary | ICD-10-CM

## 2021-04-22 DIAGNOSIS — J069 Acute upper respiratory infection, unspecified: Secondary | ICD-10-CM

## 2021-04-22 LAB — POCT INFLUENZA A/B
Influenza A, POC: NEGATIVE
Influenza B, POC: NEGATIVE

## 2021-04-22 MED ORDER — BENZONATATE 100 MG PO CAPS: 100.0000 mg | ORAL_CAPSULE | Freq: Three times a day (TID) | ORAL | 0 refills | Status: AC | PRN

## 2021-04-22 MED ORDER — FLUTICASONE PROPIONATE 50 MCG/ACT NA SUSP: 1.0000 | Freq: Every day | NASAL | 0 refills | Status: AC

## 2021-04-22 MED ORDER — CETIRIZINE HCL 10 MG PO TABS: 10.0000 mg | ORAL_TABLET | Freq: Every day | ORAL | 0 refills | Status: AC

## 2021-04-22 NOTE — Discharge Instructions (Addendum)
You likely having a viral upper respiratory infection. We recommended symptom control. I expect your symptoms to start improving in the next 1-2 weeks.   1. Take a daily allergy pill/anti-histamine like Zyrtec, Claritin, or Store brand consistently for 2 weeks.  This medication has been prescribed for you.  2. For congestion you may try an oral decongestant like Mucinex or Coricidin HBP. You may also try intranasal flonase nasal spray or saline irrigations (neti pot, sinus cleanse).  Flonase has been prescribed for you.  3. For your sore throat you may try cepacol lozenges, salt water gargles, throat spray. Treatment of congestion may also help your sore throat.  4. For cough you may try the cough medication that was prescribed for you.  5. Take Tylenol or Ibuprofen to help with pain/inflammation  6. Stay hydrated, drink plenty of fluids to keep throat coated and less irritated  Honey Tea For cough/sore throat try using a honey-based tea. Use 3 teaspoons of honey with juice squeezed from half lemon. Place shaved pieces of ginger into 1/2-1 cup of water and warm over stove top. Then mix the ingredients and repeat every 4 hours as needed.   COVID-19 test is pending.  We will call if it is positive.

## 2021-04-22 NOTE — ED Provider Notes (Signed)
[facility]    CSN: [id number] Arrival date & time: 04/22/21  1136      History   Chief Complaint Chief Complaint  Patient presents with   Cough   Nausea   Diarrhea    HPI Vickie Galvan is a 45 y.o. female.   Patient presents with nonproductive cough, nausea, diarrhea, fever, nasal congestion that started approximately 3 days ago.  Patient was recently seen on 04/08/2021 for acute illness but the patient reports that these are new symptoms.  Denies any chest pain or shortness of breath.  Patient has taken over-the-counter cough and cold medication with minimal improvement in symptoms.  Patient has been able to drink fluids.  Denies any blood in stool or emesis.  Denies any known sick contacts.   Cough Diarrhea  Past Medical History:  Diagnosis Date   Anxiety    Cough 09/17/2015   Goiter    Hypertension    states under control with med., has been on med. x 9 yr.   Muscle diastasis 08/2015   abd.   Stuffy and runny nose 09/17/2015   clear drainage from nose, per pt.    Patient Active Problem List   Diagnosis Date Noted   Hypothyroidism 11/06/2019   Iron deficiency anemia 10/12/2019   Rhinitis, chronic 06/26/2016   Obstructive sleep apnea 01/02/2016   Hyperglycemia 01/02/2016   Ventral hernia 05/04/2014   Epigastric hernia 20/25/4270   Umbilical hernia 62/37/6283   Preventative health care 05/04/2011   Rash 05/04/2011   Environmental allergies 10/27/2010   Anxiety 10/27/2010   Cervicalgia 08/20/2010   GOITER, MULTINODULAR 05/13/2009   MENORRHAGIA 03/01/2009   FATIGUE 03/01/2009   ANEMIA, IRON DEFICIENCY 08/03/2008   Hypertension 08/03/2008    Past Surgical History:  Procedure Laterality Date   ABDOMINOPLASTY Bilateral 09/23/2015   Procedure: EXPLORATION OF ABDOMEN REPAIR OF SCAR AND DIASTASIS ;  Surgeon: Cristine Polio, MD;  Location: [facility];  Service: Plastics;  Laterality: Bilateral;   BREAST BIOPSY Left 08/2018    BREAST BIOPSY Left 04/2016   CESAREAN SECTION  01/06/2005; 07/02/2006   X 2   COLONOSCOPY     INSERTION OF MESH N/A 05/04/2014   Procedure: INSERTION OF MESH;  Surgeon: Fanny Skates, MD;  Location: [facility];  Service: General;  Laterality: N/A;   INSERTION OF MESH N/A 05/06/2015   Procedure: INSERTION OF MESH;  Surgeon: Cristine Polio, MD;  Location: [facility];  Service: Plastics;  Laterality: N/A;  umbiical   RIGHT OOPHORECTOMY  01/06/2005   TUBAL LIGATION  07/02/2006   VENTRAL HERNIA REPAIR N/A 05/04/2014   Procedure: LAPAROSCOPIC REPAIR MUTIPLE INCARCERATED VENTRAL HERNIAS;  Surgeon: Fanny Skates, MD;  Location: [facility];  Service: General;  Laterality: N/A;   VENTRAL HERNIA REPAIR N/A 05/06/2015   Procedure: EXPLORATION OF ABDOMIN, REPAIR RECURRENT VENTRAL HERNIA AND SEVERE DIASTASIS RECTI;  Surgeon: Cristine Polio, MD;  Location: [facility];  Service: Plastics;  Laterality: N/A;    OB History   No obstetric history on file.      Home Medications    Prior to Admission medications   Medication Sig Start Date End Date Taking? Authorizing Provider  benzonatate (TESSALON) 100 MG capsule Take 1 capsule (100 mg total) by mouth every 8 (eight) hours as needed for cough. 04/22/21  Yes Giuliano Preece, Michele Rockers, FNP  cetirizine (ZYRTEC) 10 MG tablet Take 1 tablet (10 mg total) by mouth daily. 04/22/21  Yes Teodora Medici, FNP  fluticasone (  FLONASE) 50 MCG/ACT nasal spray Place 1 spray into both nostrils daily. 04/22/21  Yes Charne Mcbrien, Hildred Alamin E, FNP  albuterol (PROAIR HFA) 108 (90 Base) MCG/ACT inhaler ProAir HFA 90 mcg/actuation aerosol inhaler  INHALE 2 PUFFS BY MOUTH EVERY 4 HOURS AS NEEDED FOR WHEEZE 09/14/17   [provider]  allopurinol (ZYLOPRIM) 300 MG tablet Take 1 tablet (300 mg total) by mouth daily. 08/07/20   Gregor Hams, MD  ALPRAZolam Duanne Moron) 0.25 MG tablet TAKE 1 TABLET BY MOUTH TWICE A DAY AS NEEDED ANXIETY 02/14/18   Biagio Borg, MD  amLODipine  (NORVASC) 10 MG tablet Take 1 tablet (10 mg total) by mouth daily. 01/09/21   Biagio Borg, MD  azelastine (ASTELIN) 0.1 % nasal spray Place 2 sprays into both nostrils 2 (two) times daily. 01/09/20   Tasia Catchings, Amy V, PA-C  colchicine 0.6 MG tablet TAKE 1 TABLET (0.6 MG TOTAL) BY MOUTH DAILY AS NEEDED (GOUT OR PSUEDOGOUT PAIN). 11/04/20   Gregor Hams, MD  levonorgestrel (MIRENA, 52 MG,) 20 MCG/24HR IUD 1 Device by Intrauterine route as directed.    [provider]  levothyroxine (SYNTHROID) 88 MCG tablet Take 1 tablet (88 mcg total) by mouth daily. 08/07/20   Biagio Borg, MD  ondansetron (ZOFRAN-ODT) 4 MG disintegrating tablet Take 1 tablet (4 mg total) by mouth every 8 (eight) hours as needed for nausea or vomiting. 04/08/21   Teodora Medici, FNP  Potassium Chloride ER 20 MEQ TBCR Take 1 tablet by mouth 2 (two) times daily. Must keep scheduled appt for future refills 12/20/20   Biagio Borg, MD  telmisartan-hydrochlorothiazide (MICARDIS HCT) 80-12.5 MG tablet Take 1 tablet by mouth daily. 04/08/21   Biagio Borg, MD    Family History Family History  Problem Relation Age of Onset   Hypertension Maternal Grandmother    Hypertension Maternal Grandfather    Diabetes Maternal Grandfather    Stroke Maternal Grandfather    Hypertension Mother     Social History Social History   Tobacco Use   Smoking status: Never   Smokeless tobacco: Never  Vaping Use   Vaping Use: Never used  Substance Use Topics   Alcohol use: Yes    Comment: occasionally   Drug use: No     Allergies   Azithromycin and Cephalexin   Review of Systems Review of Systems Per HPI  Physical Exam Triage Vital Signs ED Triage Vitals [04/22/21 1413]  Enc Vitals Group     BP 125/88     Pulse Rate (!) 118     Resp 16     Temp 98.9 F (37.2 C)     Temp Source Oral     SpO2 98 %     Weight      Height      Head Circumference      Peak Flow      Pain Score 0     Pain Loc      Pain Edu?      Excl. in [place]?     No data found.  Updated Vital Signs BP 125/88 (BP Location: Right Arm)   Pulse (!) 118   Temp 98.9 F (37.2 C) (Oral)   Resp 16   SpO2 98%   Visual Acuity Right Eye Distance:   Left Eye Distance:   Bilateral Distance:    Right Eye Near:   Left Eye Near:    Bilateral Near:     Physical Exam Constitutional:  General: She is not in acute distress.    Appearance: Normal appearance. She is not toxic-appearing or diaphoretic.  HENT:     Head: Normocephalic and atraumatic.     Right Ear: Tympanic membrane and ear canal normal.     Left Ear: Tympanic membrane and ear canal normal.     Nose: Congestion present.     Mouth/Throat:     Mouth: Mucous membranes are moist.     Pharynx: No posterior oropharyngeal erythema.  Eyes:     Extraocular Movements: Extraocular movements intact.     Conjunctiva/sclera: Conjunctivae normal.     Pupils: Pupils are equal, round, and reactive to light.  Cardiovascular:     Rate and Rhythm: Normal rate and regular rhythm.     Pulses: Normal pulses.     Heart sounds: Normal heart sounds.  Pulmonary:     Effort: Pulmonary effort is normal. No respiratory distress.     Breath sounds: Normal breath sounds. No stridor. No wheezing, rhonchi or rales.  Abdominal:     General: Abdomen is flat. Bowel sounds are normal.     Palpations: Abdomen is soft.  Musculoskeletal:        General: Normal range of motion.     Cervical back: Normal range of motion.  Skin:    General: Skin is warm and dry.  Neurological:     General: No focal deficit present.     Mental Status: She is alert and oriented to person, place, and time. Mental status is at baseline.  Psychiatric:        Mood and Affect: Mood normal.        Behavior: Behavior normal.     UC Treatments / Results  Labs (all labs ordered are listed, but only abnormal results are displayed) Labs Reviewed  NOVEL CORONAVIRUS, NAA  POCT INFLUENZA A/B    EKG   Radiology No results  found.  Procedures Procedures (including critical care time)  Medications Ordered in UC Medications - No data to display  Initial Impression / Assessment and Plan / UC Course  I have reviewed the triage vital signs and the nursing notes.  Pertinent labs & imaging results that were available during my care of the patient were reviewed by me and considered in my medical decision making (see chart for details).     Patient presents with symptoms likely from a viral upper respiratory infection. Differential includes bacterial pneumonia, sinusitis, allergic rhinitis, covid 19. Do not suspect underlying cardiopulmonary process. Symptoms seem unlikely related to ACS, CHF or COPD exacerbations, pneumonia, pneumothorax. Patient is nontoxic appearing and not in need of emergent medical intervention.  Rapid flu test was negative.  COVID-19 viral swab pending.  Recommended symptom control with over the counter medications that are safe with high blood pressure including Mucinex and Coricidin HBP.  Cetirizine and Flonase sent for patient.  Benzonatate to take as needed for cough also sent.  Return if symptoms fail to improve in 1-2 weeks or you develop shortness of breath, chest pain, severe headache. Patient states understanding and is agreeable.  Discharged with PCP followup.  Final Clinical Impressions(s) / UC Diagnoses   Final diagnoses:  Viral upper respiratory tract infection with cough  Flu-like symptoms  Encounter for laboratory testing for COVID-19 virus     Discharge Instructions      You likely having a viral upper respiratory infection. We recommended symptom control. I expect your symptoms to start improving in the next 1-2 weeks.  1. Take a daily allergy pill/anti-histamine like Zyrtec, Claritin, or Store brand consistently for 2 weeks.  This medication has been prescribed for you.  2. For congestion you may try an oral decongestant like Mucinex or Coricidin HBP. You may also  try intranasal flonase nasal spray or saline irrigations (neti pot, sinus cleanse).  Flonase has been prescribed for you.  3. For your sore throat you may try cepacol lozenges, salt water gargles, throat spray. Treatment of congestion may also help your sore throat.  4. For cough you may try the cough medication that was prescribed for you.  5. Take Tylenol or Ibuprofen to help with pain/inflammation  6. Stay hydrated, drink plenty of fluids to keep throat coated and less irritated  Honey Tea For cough/sore throat try using a honey-based tea. Use 3 teaspoons of honey with juice squeezed from half lemon. Place shaved pieces of ginger into 1/2-1 cup of water and warm over stove top. Then mix the ingredients and repeat every 4 hours as needed.   COVID-19 test is pending.  We will call if it is positive.     ED Prescriptions     Medication Sig Dispense Auth. Provider   benzonatate (TESSALON) 100 MG capsule Take 1 capsule (100 mg total) by mouth every 8 (eight) hours as needed for cough. 21 capsule [place], [place] E, [place]   cetirizine (ZYRTEC) 10 MG tablet Take 1 tablet (10 mg total) by mouth daily. 30 tablet [place], [place] E, [place]   fluticasone [facility]) 50 MCG/ACT nasal spray Place 1 spray into both nostrils daily. 16 g Teodora Medici, [place]      PDMP not reviewed this encounter.   Teodora Medici, [place] 04/22/21 [phone]

## 2021-04-22 NOTE — ED Triage Notes (Signed)
Cough, nausea, diarrhea, low grade fever, nasal congestion starting Sunday, reports decreased appetite as well

## 2021-04-23 LAB — SARS-COV-2, NAA 2 DAY TAT

## 2021-04-23 LAB — NOVEL CORONAVIRUS, NAA: SARS-CoV-2, NAA: NOT DETECTED

## 2021-05-02 ENCOUNTER — Other Ambulatory Visit: Payer: Self-pay | Admitting: Internal Medicine

## 2021-05-02 DIAGNOSIS — Z23 Encounter for immunization: Secondary | ICD-10-CM | POA: Diagnosis not present

## 2021-05-02 NOTE — Telephone Encounter (Signed)
Please refill as per office routine med refill policy (all routine meds to be refilled for 3 mo or monthly (per pt preference) up to one year from last visit, then month to month grace period for 3 mo, then further med refills will have to be denied) ? ?

## 2021-05-21 ENCOUNTER — Encounter: Payer: BC Managed Care – PPO | Admitting: Internal Medicine

## 2021-06-10 ENCOUNTER — Other Ambulatory Visit: Payer: Self-pay | Admitting: Internal Medicine

## 2021-06-10 NOTE — Telephone Encounter (Signed)
Please refill as per office routine med refill policy (all routine meds to be refilled for 3 mo or monthly (per pt preference) up to one year from last visit, then month to month grace period for 3 mo, then further med refills will have to be denied) ? ?

## 2021-06-19 ENCOUNTER — Other Ambulatory Visit: Payer: Self-pay

## 2021-06-19 ENCOUNTER — Encounter: Payer: Self-pay | Admitting: Emergency Medicine

## 2021-06-19 ENCOUNTER — Ambulatory Visit
Admission: EM | Admit: 2021-06-19 | Discharge: 2021-06-19 | Disposition: A | Discharge status: Home / Self Care | Payer: BC Managed Care – PPO

## 2021-06-19 DIAGNOSIS — U071 COVID-19: Secondary | ICD-10-CM

## 2021-06-19 NOTE — ED Provider Notes (Signed)
[facility]    CSN: [id number] Arrival date & time: 06/19/21  1704      History   Chief Complaint Chief Complaint  Patient presents with   Cough   Shortness of Breath    HPI Vickie Galvan is a 45 y.o. female.   Patient here today for evaluation of continued symptoms since being diagnosed with covid 8 days ago. She states she continues to have cough and some shortness of breath. She is requesting note for work. She has not had fever.  The history is provided by the patient.   Past Medical History:  Diagnosis Date   Anxiety    Cough 09/17/2015   Goiter    Hypertension    states under control with med., has been on med. x 9 yr.   Muscle diastasis 08/2015   abd.   Stuffy and runny nose 09/17/2015   clear drainage from nose, per pt.    Patient Active Problem List   Diagnosis Date Noted   Hypothyroidism 11/06/2019   Iron deficiency anemia 10/12/2019   Rhinitis, chronic 06/26/2016   Obstructive sleep apnea 01/02/2016   Hyperglycemia 01/02/2016   Ventral hernia 05/04/2014   Epigastric hernia 18/84/1660   Umbilical hernia 63/06/6008   Preventative health care 05/04/2011   Rash 05/04/2011   Environmental allergies 10/27/2010   Anxiety 10/27/2010   Cervicalgia 08/20/2010   GOITER, MULTINODULAR 05/13/2009   MENORRHAGIA 03/01/2009   FATIGUE 03/01/2009   ANEMIA, IRON DEFICIENCY 08/03/2008   Hypertension 08/03/2008    Past Surgical History:  Procedure Laterality Date   ABDOMINOPLASTY Bilateral 09/23/2015   Procedure: EXPLORATION OF ABDOMEN REPAIR OF SCAR AND DIASTASIS ;  Surgeon: Cristine Polio, MD;  Location: [place];  Service: Plastics;  Laterality: Bilateral;   BREAST BIOPSY Left 08/2018   BREAST BIOPSY Left 04/2016   CESAREAN SECTION  01/06/2005; 07/02/2006   X 2   COLONOSCOPY     INSERTION OF MESH N/A 05/04/2014   Procedure: INSERTION OF MESH;  Surgeon: Fanny Skates, MD;  Location: [place];  Service: General;  Laterality:  N/A;   INSERTION OF MESH N/A 05/06/2015   Procedure: INSERTION OF MESH;  Surgeon: Cristine Polio, MD;  Location: [place];  Service: Plastics;  Laterality: N/A;  umbiical   RIGHT OOPHORECTOMY  01/06/2005   TUBAL LIGATION  07/02/2006   VENTRAL HERNIA REPAIR N/A 05/04/2014   Procedure: LAPAROSCOPIC REPAIR MUTIPLE INCARCERATED VENTRAL HERNIAS;  Surgeon: Fanny Skates, MD;  Location: [place];  Service: General;  Laterality: N/A;   VENTRAL HERNIA REPAIR N/A 05/06/2015   Procedure: EXPLORATION OF ABDOMIN, REPAIR RECURRENT VENTRAL HERNIA AND SEVERE DIASTASIS RECTI;  Surgeon: Cristine Polio, MD;  Location: [place];  Service: Plastics;  Laterality: N/A;    OB History   No obstetric history on file.      Home Medications    Prior to Admission medications   Medication Sig Start Date End Date Taking? Authorizing Provider  albuterol (PROAIR HFA) 108 (90 Base) MCG/ACT inhaler ProAir HFA 90 mcg/actuation aerosol inhaler  INHALE 2 PUFFS BY MOUTH EVERY 4 HOURS AS NEEDED FOR WHEEZE 09/14/17   [provider]  allopurinol (ZYLOPRIM) 300 MG tablet Take 1 tablet (300 mg total) by mouth daily. 08/07/20   Gregor Hams, MD  ALPRAZolam Duanne Moron) 0.25 MG tablet TAKE 1 TABLET BY MOUTH TWICE A DAY AS NEEDED ANXIETY 02/14/18   Biagio Borg, MD  amLODipine (NORVASC) 10 MG tablet Take 1 tablet (10  mg total) by mouth daily. 01/09/21   Biagio Borg, MD  azelastine (ASTELIN) 0.1 % nasal spray Place 2 sprays into both nostrils 2 (two) times daily. 01/09/20   Tasia Catchings, Amy V, PA-C  benzonatate (TESSALON) 100 MG capsule Take 1 capsule (100 mg total) by mouth every 8 (eight) hours as needed for cough. 04/22/21   Teodora Medici, FNP  cetirizine (ZYRTEC) 10 MG tablet Take 1 tablet (10 mg total) by mouth daily. 04/22/21   Teodora Medici, FNP  colchicine 0.6 MG tablet TAKE 1 TABLET (0.6 MG TOTAL) BY MOUTH DAILY AS NEEDED (GOUT OR PSUEDOGOUT PAIN). 11/04/20   Gregor Hams, MD  fluticasone  (FLONASE) 50 MCG/ACT nasal spray Place 1 spray into both nostrils daily. 04/22/21   Teodora Medici, FNP  levonorgestrel (MIRENA, 52 MG,) 20 MCG/24HR IUD 1 Device by Intrauterine route as directed.    [provider]  levothyroxine (SYNTHROID) 88 MCG tablet Take 1 tablet (88 mcg total) by mouth daily. 08/07/20   Biagio Borg, MD  ondansetron (ZOFRAN-ODT) 4 MG disintegrating tablet Take 1 tablet (4 mg total) by mouth every 8 (eight) hours as needed for nausea or vomiting. 04/08/21   Teodora Medici, FNP  Potassium Chloride ER 20 MEQ TBCR Take 1 tablet by mouth 2 (two) times daily. Must keep scheduled appt for future refills 12/20/20   Biagio Borg, MD  telmisartan-hydrochlorothiazide (MICARDIS HCT) 80-12.5 MG tablet Take 1 tablet by mouth daily. 04/08/21   Biagio Borg, MD    Family History Family History  Problem Relation Age of Onset   Hypertension Maternal Grandmother    Hypertension Maternal Grandfather    Diabetes Maternal Grandfather    Stroke Maternal Grandfather    Hypertension Mother     Social History Social History   Tobacco Use   Smoking status: Never   Smokeless tobacco: Never  Vaping Use   Vaping Use: Never used  Substance Use Topics   Alcohol use: Yes    Comment: occasionally   Drug use: No     Allergies   Azithromycin and Cephalexin   Review of Systems Review of Systems  Constitutional:  Negative for chills and fever.  HENT:  Negative for congestion and ear pain.   Eyes:  Negative for discharge and redness.  Respiratory:  Positive for cough and shortness of breath. Negative for wheezing.   Gastrointestinal:  Negative for abdominal pain, diarrhea, nausea and vomiting.    Physical Exam Triage Vital Signs ED Triage Vitals  Enc Vitals Group     BP      Pulse      Resp      Temp      Temp src      SpO2      Weight      Height      Head Circumference      Peak Flow      Pain Score      Pain Loc      Pain Edu?      Excl. in [place]?    No data  found.  Updated Vital Signs BP (!) 133/92 (BP Location: Left Arm)    Pulse (!) 102    Temp 98.2 F (36.8 C) (Oral)    Resp 16    SpO2 96%      Physical Exam Vitals and nursing note reviewed.  Constitutional:      General: She is not in acute distress.    Appearance:  Normal appearance. She is not ill-appearing.  HENT:     Head: Normocephalic and atraumatic.     Nose: Congestion present.     Mouth/Throat:     Mouth: Mucous membranes are moist.     Pharynx: No oropharyngeal exudate or posterior oropharyngeal erythema.  Eyes:     Conjunctiva/sclera: Conjunctivae normal.  Cardiovascular:     Rate and Rhythm: Normal rate and regular rhythm.     Heart sounds: Normal heart sounds. No murmur heard. Pulmonary:     Effort: Pulmonary effort is normal. No respiratory distress.     Breath sounds: Normal breath sounds. No wheezing, rhonchi or rales.  Skin:    General: Skin is warm and dry.  Neurological:     Mental Status: She is alert.  Psychiatric:        Mood and Affect: Mood normal.        Thought Content: Thought content normal.     UC Treatments / Results  Labs (all labs ordered are listed, but only abnormal results are displayed) Labs Reviewed - No data to display  EKG   Radiology No results found.  Procedures Procedures (including critical care time)  Medications Ordered in UC Medications - No data to display  Initial Impression / Assessment and Plan / UC Course  I have reviewed the triage vital signs and the nursing notes.  Pertinent labs & imaging results that were available during my care of the patient were reviewed by me and considered in my medical decision making (see chart for details).    Work noted provided as requested. Recommended follow up if no gradual improvement or if symptoms worsen. Advised increased rest.   Final Clinical Impressions(s) / UC Diagnoses   Final diagnoses:  [id number]   Discharge Instructions   None    ED Prescriptions    None    PDMP not reviewed this encounter.   Francene Finders, PA-C 06/19/21 [facility]

## 2021-06-19 NOTE — ED Triage Notes (Signed)
Covid + on 12/21. Continues to have symptoms, coughing, shortness of breath

## 2021-06-25 ENCOUNTER — Encounter: Payer: BC Managed Care – PPO | Admitting: Family

## 2021-07-09 ENCOUNTER — Encounter: Payer: BC Managed Care – PPO | Admitting: Family

## 2021-08-01 ENCOUNTER — Telehealth: Payer: Self-pay | Admitting: Internal Medicine

## 2021-08-01 NOTE — Telephone Encounter (Signed)
Vickie Galvan from [facility] calls today in regards to a complaint against Dr.John. Vickie Galvan explains Dr.John had called the [name] wanting a cause of death for PT.  CB: [phone]

## 2021-08-10 NOTE — Progress Notes (Signed)
Erroneous encounter

## 2021-08-15 ENCOUNTER — Encounter: Payer: BC Managed Care – PPO | Admitting: Family

## 2021-08-15 DIAGNOSIS — I10 Essential (primary) hypertension: Secondary | ICD-10-CM

## 2021-08-15 DIAGNOSIS — Z113 Encounter for screening for infections with a predominantly sexual mode of transmission: Secondary | ICD-10-CM

## 2021-08-15 DIAGNOSIS — E039 Hypothyroidism, unspecified: Secondary | ICD-10-CM

## 2021-08-15 DIAGNOSIS — Z124 Encounter for screening for malignant neoplasm of cervix: Secondary | ICD-10-CM

## 2021-08-15 DIAGNOSIS — Z130 Encounter for screening for diseases of the blood and blood-forming organs and certain disorders involving the immune mechanism: Secondary | ICD-10-CM

## 2021-08-15 DIAGNOSIS — Z13228 Encounter for screening for other metabolic disorders: Secondary | ICD-10-CM

## 2021-08-15 DIAGNOSIS — Z0000 Encounter for general adult medical examination without abnormal findings: Secondary | ICD-10-CM

## 2021-08-15 DIAGNOSIS — Z1211 Encounter for screening for malignant neoplasm of colon: Secondary | ICD-10-CM

## 2021-08-15 DIAGNOSIS — Z131 Encounter for screening for diabetes mellitus: Secondary | ICD-10-CM

## 2021-08-15 DIAGNOSIS — Z13220 Encounter for screening for lipoid disorders: Secondary | ICD-10-CM

## 2021-08-20 DEATH — deceased

## 2021-10-31 IMAGING — CT CT ANGIO CHEST
2 of 6 series · 19 of 36 positions shown · IV contrast (omnipaque)
Comparison: None.

CLINICAL DATA: Dizziness, anxiety elevated D-dimer, evaluate for PE

EXAM:
CT ANGIOGRAPHY CHEST WITH CONTRAST
TECHNIQUE: Multidetector CT imaging of the chest was performed using the
standard protocol during bolus administration of intravenous
contrast. Multiplanar CT image reconstructions and MIPs were
obtained to evaluate the vascular anatomy.
CONTRAST:  100mL OMNIPAQUE IOHEXOL 350 MG/ML SOLN

[Series 7: pe thins · axial · 0.72mm/px · z∈[-275,-11]mm · 18 of 422 slices shown]
[im 22/422  lung]
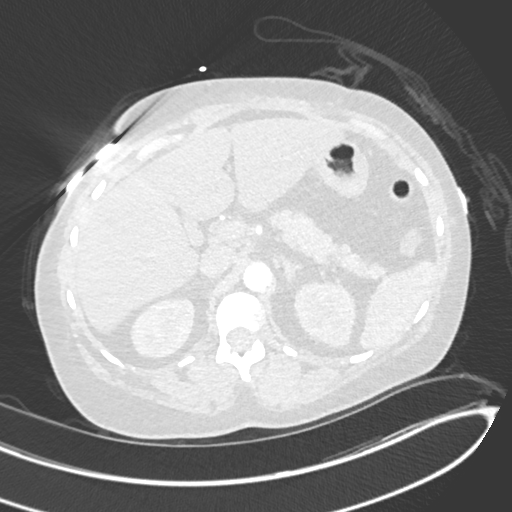
[im 43/422  mediastinal]
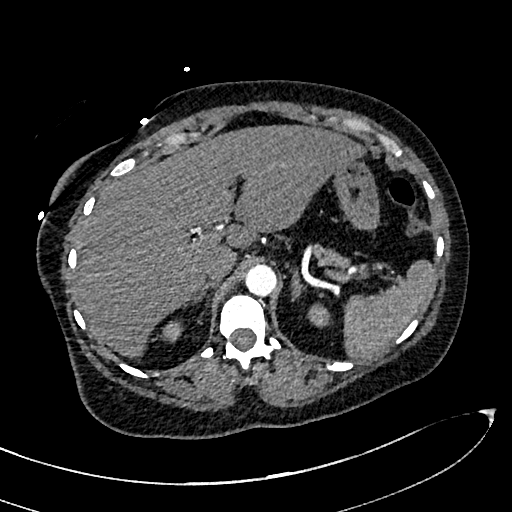
[im 64/422  lung]
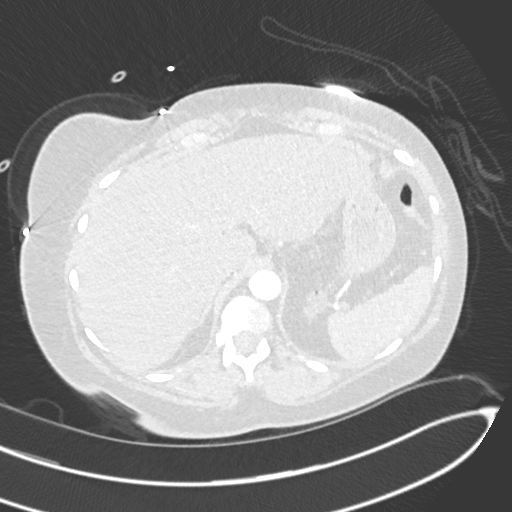
[im 85/422  mediastinal]
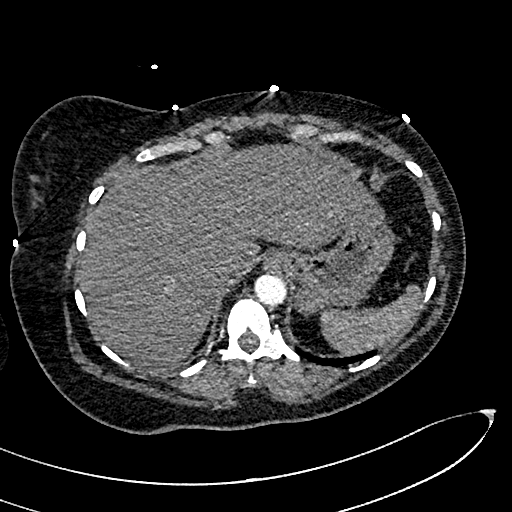
[im 106/422  lung]
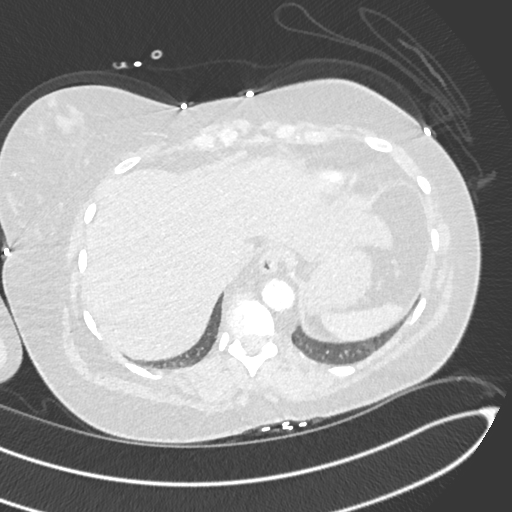
[im 127/422  mediastinal]
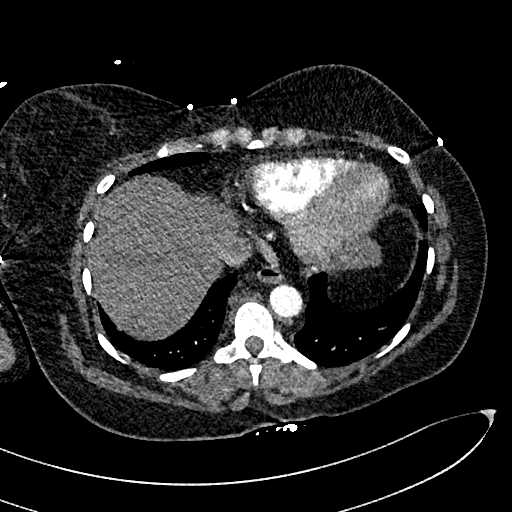
[im 148/422  lung]
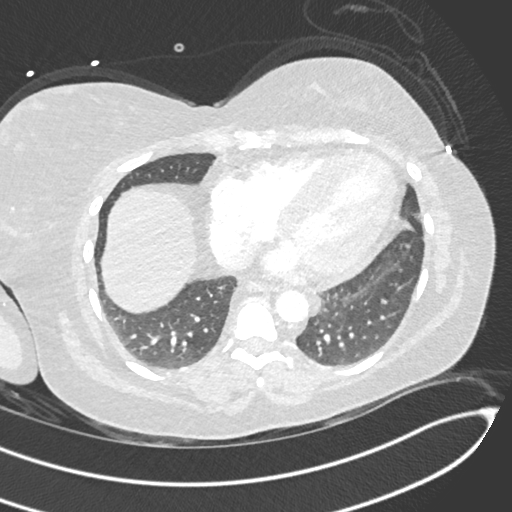
[im 169/422  mediastinal]
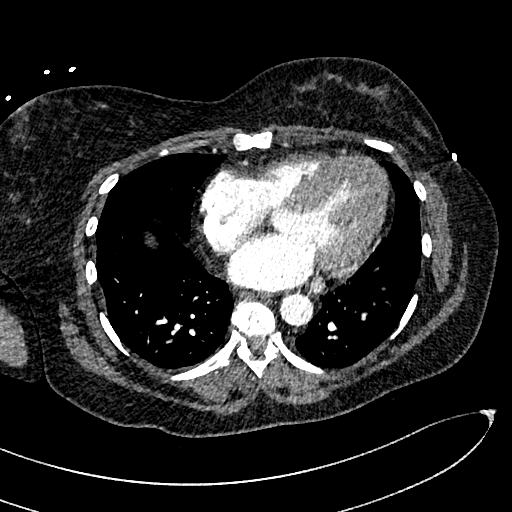
[im 190/422  lung]
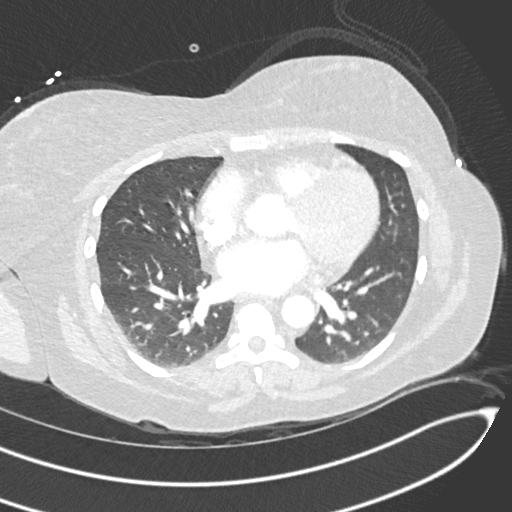
[im 232/422  mediastinal]
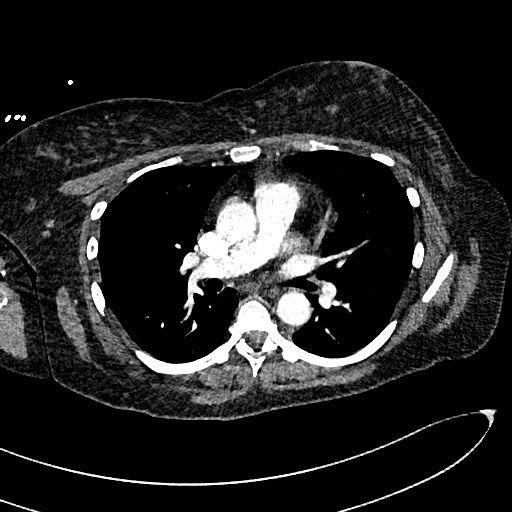
[im 253/422  lung]
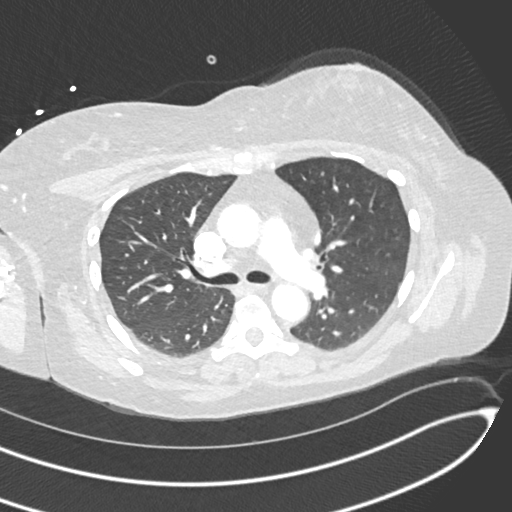
[im 274/422  mediastinal]
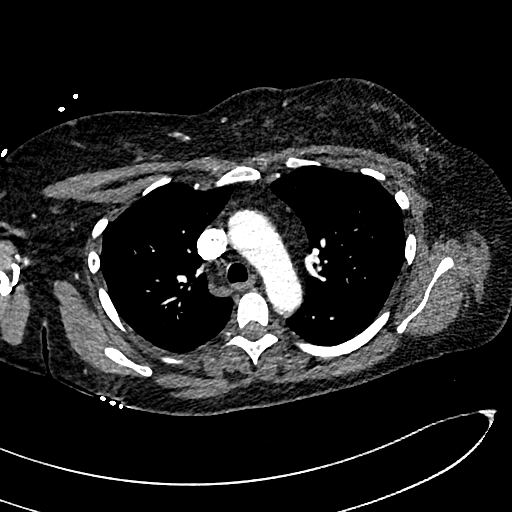
[im 295/422  lung]
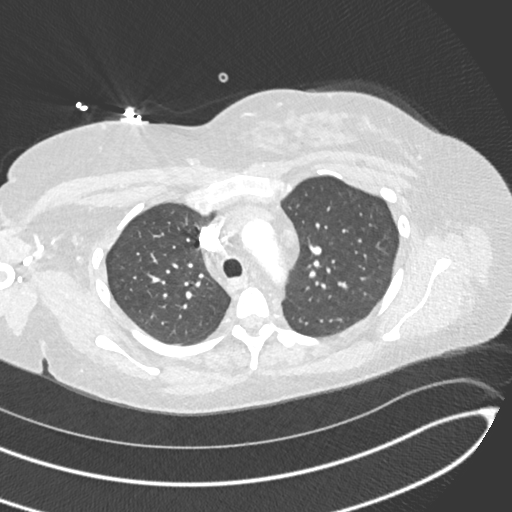
[im 316/422  mediastinal]
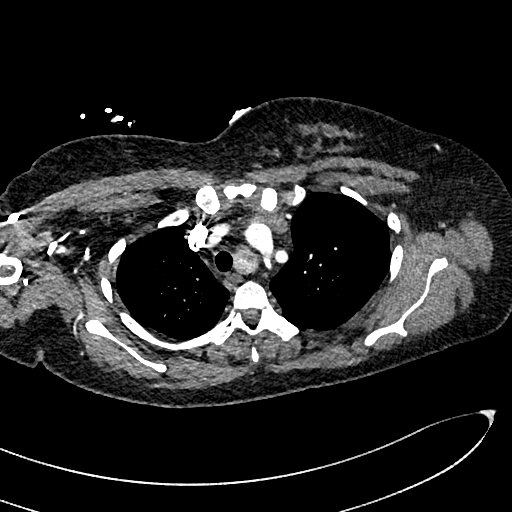
[im 337/422  lung]
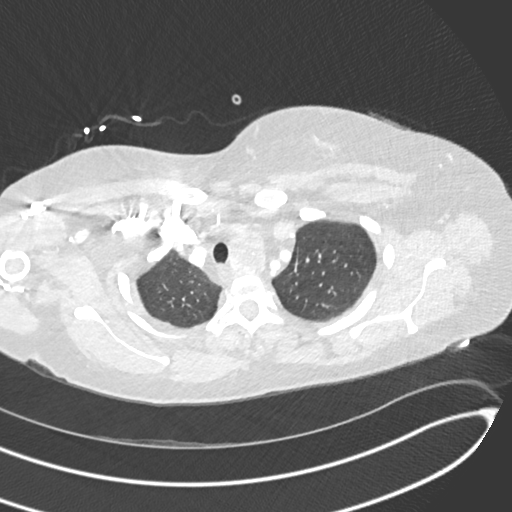
[im 358/422  mediastinal]
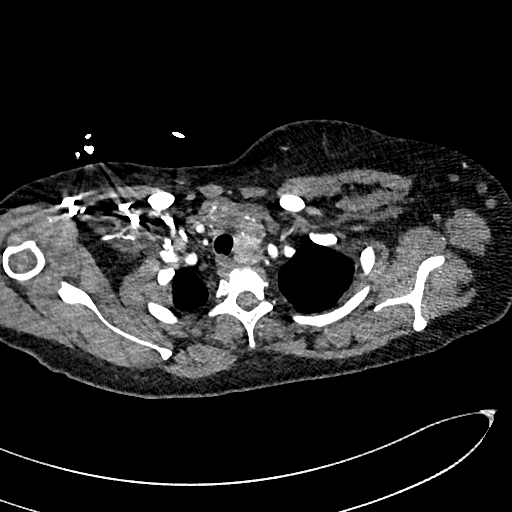
[im 379/422  lung]
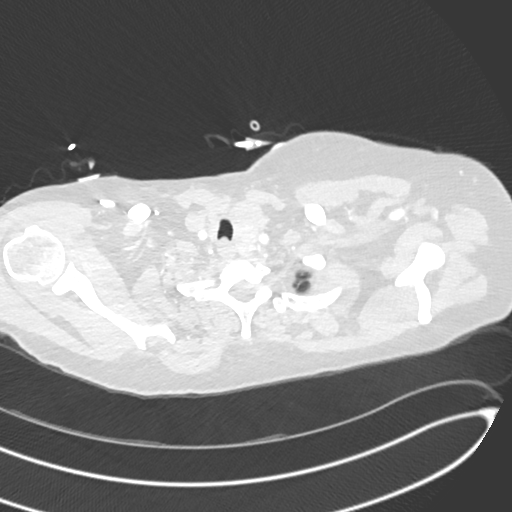
[im 400/422  mediastinal]
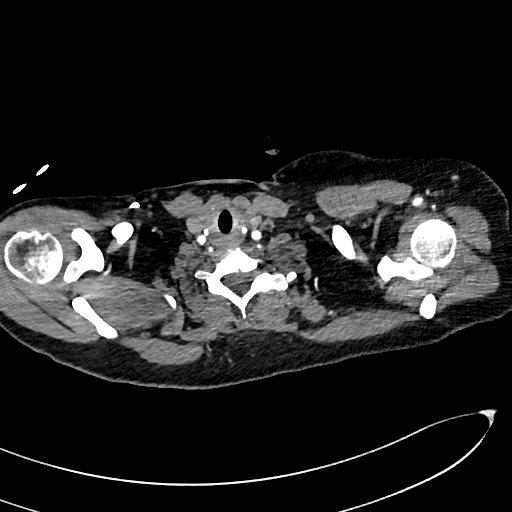

[Series 8: pe 2mm cor · coronal · 0.67mm/px · 1 of 151 slices shown]
[im 76/151  mediastinal]
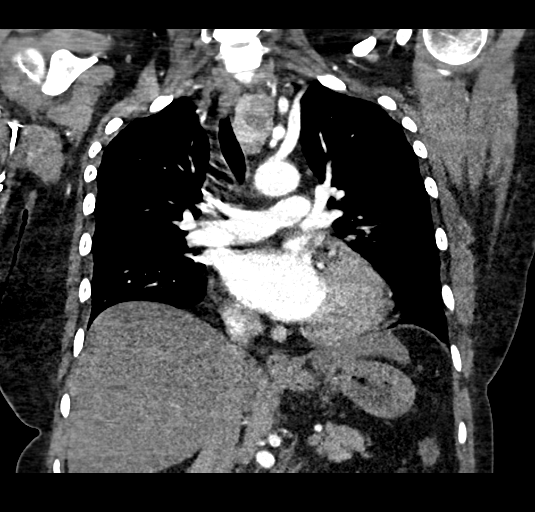

[19 of 36 positions shown; findings below may reference images not displayed]

FINDINGS: Cardiovascular: Satisfactory opacification of the bilateral
pulmonary arteries to the lobar level. No evidence of pulmonary
embolism. Mild artifact in branches of the subsegmental left lower
lobe pulmonary artery.

Although not tailored for evaluation of the thoracic aorta, there is
no evidence thoracic aortic aneurysm or dissection.

The heart is normal in size.  No pericardial effusion.

Mediastinum/Nodes: No suspicious mediastinal lymphadenopathy.

Enlarged thyroid with multinodular goiter including substernal
extension on the left (series 5/image 29). This has been documented
on prior studies (ref: [HOSPITAL]. [DATE]): 143-50).

Lungs/Pleura: Lungs are clear.

No suspicious pulmonary nodules.

No focal consolidation.

No pleural effusion or pneumothorax.

Upper Abdomen: Visualized upper abdomen is grossly unremarkable.

Musculoskeletal: Visualized osseous structures are within normal
limits.

Review of the MIP images confirms the above findings.
IMPRESSION: No evidence of pulmonary embolism.

Negative CT chest.
